# Patient Record
Sex: Male | Born: 1950 | Race: White | Hispanic: No | Marital: Married | Smoking: Never smoker
Health system: Southern US, Community
[De-identification: ages and names within clinical notes are randomized; demographics above are authoritative.]

## PROBLEM LIST (undated history)

## (undated) ENCOUNTER — Emergency Department (HOSPITAL_COMMUNITY)

## (undated) DIAGNOSIS — J189 Pneumonia, unspecified organism: Secondary | ICD-10-CM

## (undated) DIAGNOSIS — E669 Obesity, unspecified: Secondary | ICD-10-CM

## (undated) DIAGNOSIS — M545 Low back pain, unspecified: Secondary | ICD-10-CM

## (undated) DIAGNOSIS — Z860100 Personal history of colon polyps, unspecified: Secondary | ICD-10-CM

## (undated) DIAGNOSIS — J45909 Unspecified asthma, uncomplicated: Secondary | ICD-10-CM

## (undated) DIAGNOSIS — M1990 Unspecified osteoarthritis, unspecified site: Secondary | ICD-10-CM

## (undated) DIAGNOSIS — J301 Allergic rhinitis due to pollen: Secondary | ICD-10-CM

## (undated) DIAGNOSIS — J4520 Mild intermittent asthma, uncomplicated: Secondary | ICD-10-CM

## (undated) DIAGNOSIS — I10 Essential (primary) hypertension: Secondary | ICD-10-CM

## (undated) DIAGNOSIS — L301 Dyshidrosis [pompholyx]: Secondary | ICD-10-CM

## (undated) DIAGNOSIS — T8859XA Other complications of anesthesia, initial encounter: Secondary | ICD-10-CM

## (undated) DIAGNOSIS — K5792 Diverticulitis of intestine, part unspecified, without perforation or abscess without bleeding: Secondary | ICD-10-CM

## (undated) DIAGNOSIS — E66811 Obesity, class 1: Secondary | ICD-10-CM

## (undated) DIAGNOSIS — Z9289 Personal history of other medical treatment: Secondary | ICD-10-CM

## (undated) DIAGNOSIS — Z8042 Family history of malignant neoplasm of prostate: Secondary | ICD-10-CM

## (undated) DIAGNOSIS — T4145XA Adverse effect of unspecified anesthetic, initial encounter: Secondary | ICD-10-CM

## (undated) DIAGNOSIS — Z8719 Personal history of other diseases of the digestive system: Secondary | ICD-10-CM

## (undated) DIAGNOSIS — Z800 Family history of malignant neoplasm of digestive organs: Secondary | ICD-10-CM

## (undated) DIAGNOSIS — E785 Hyperlipidemia, unspecified: Secondary | ICD-10-CM

## (undated) HISTORY — DX: Essential (primary) hypertension: I10

## (undated) HISTORY — PX: APPENDECTOMY: SHX54

## (undated) HISTORY — PX: COLONOSCOPY: SHX174

## (undated) HISTORY — DX: Personal history of colon polyps, unspecified: Z86.0100

## (undated) HISTORY — DX: Low back pain, unspecified: M54.50

## (undated) HISTORY — DX: Family history of malignant neoplasm of digestive organs: Z80.0

## (undated) HISTORY — DX: Personal history of colonic polyps: Z86.010

## (undated) HISTORY — DX: Mild intermittent asthma, uncomplicated: J45.20

## (undated) HISTORY — PX: NASAL SINUS SURGERY: SHX719

## (undated) HISTORY — PX: HEMORRHOID SURGERY: SHX153

## (undated) HISTORY — PX: REVISION TOTAL HIP ARTHROPLASTY: SHX766

## (undated) HISTORY — DX: Obesity, class 1: E66.811

## (undated) HISTORY — DX: Allergic rhinitis due to pollen: J30.1

## (undated) HISTORY — PX: WISDOM TOOTH EXTRACTION: SHX21

## (undated) HISTORY — DX: Family history of malignant neoplasm of prostate: Z80.42

## (undated) HISTORY — DX: Dyshidrosis (pompholyx): L30.1

## (undated) HISTORY — DX: Diverticulitis of intestine, part unspecified, without perforation or abscess without bleeding: K57.92

## (undated) HISTORY — DX: Obesity, unspecified: E66.9

## (undated) HISTORY — DX: Personal history of other diseases of the digestive system: Z87.19

---
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
## 1991-06-13 HISTORY — PX: JOINT REPLACEMENT: SHX530

## 1999-06-02 ENCOUNTER — Encounter (INDEPENDENT_AMBULATORY_CARE_PROVIDER_SITE_OTHER): Payer: Self-pay

## 1999-06-02 ENCOUNTER — Ambulatory Visit (HOSPITAL_BASED_OUTPATIENT_CLINIC_OR_DEPARTMENT_OTHER): Admission: RE | Admit: 1999-06-02 | Discharge: 1999-06-02 | Payer: Self-pay | Admitting: *Deleted

## 2003-07-09 HISTORY — PX: NM MYOCAR PERF WALL MOTION: HXRAD629

## 2003-07-31 HISTORY — PX: CARDIAC CATHETERIZATION: SHX172

## 2005-02-24 ENCOUNTER — Encounter: Admission: RE | Admit: 2005-02-24 | Discharge: 2005-02-24 | Payer: Self-pay | Admitting: Unknown Physician Specialty

## 2006-10-22 HISTORY — PX: NM MYOCAR PERF WALL MOTION: HXRAD629

## 2006-12-10 ENCOUNTER — Ambulatory Visit (HOSPITAL_COMMUNITY): Admission: RE | Admit: 2006-12-10 | Discharge: 2006-12-10 | Payer: Self-pay | Admitting: General Surgery

## 2006-12-10 ENCOUNTER — Encounter (INDEPENDENT_AMBULATORY_CARE_PROVIDER_SITE_OTHER): Payer: Self-pay | Admitting: General Surgery

## 2007-05-28 ENCOUNTER — Ambulatory Visit: Payer: Self-pay | Admitting: Gastroenterology

## 2007-05-28 LAB — CONVERTED CEMR LAB
AST: 25 units/L (ref 0–37)
Alkaline Phosphatase: 67 units/L (ref 39–117)
BUN: 12 mg/dL (ref 6–23)
Basophils Absolute: 0 10*3/uL (ref 0.0–0.1)
Basophils Relative: 0.3 % (ref 0.0–1.0)
Chloride: 100 meq/L (ref 96–112)
Eosinophils Absolute: 0.1 10*3/uL (ref 0.0–0.6)
Eosinophils Relative: 1.2 % (ref 0.0–5.0)
Folate: 14 ng/mL
GFR calc non Af Amer: 61 mL/min
Hemoglobin: 14.5 g/dL (ref 13.0–17.0)
Lipase: 29 units/L (ref 11.0–59.0)
MCV: 88.5 fL (ref 78.0–100.0)
Monocytes Absolute: 0.6 10*3/uL (ref 0.2–0.7)
Neutrophils Relative %: 70 % (ref 43.0–77.0)
Platelets: 244 10*3/uL (ref 150–400)
RBC: 4.61 M/uL (ref 4.22–5.81)
RDW: 12.9 % (ref 11.5–14.6)
Sed Rate: 16 mm/hr (ref 0–20)
TSH: 2.39 microintl units/mL (ref 0.35–5.50)
Tissue Transglutaminase Ab, IgA: 0.2 units (ref <7)
Transferrin: 270.9 mg/dL (ref >=212.0)
WBC: 6.6 10*3/uL (ref 4.5–10.5)

## 2007-09-18 ENCOUNTER — Ambulatory Visit: Payer: Self-pay | Admitting: Gastroenterology

## 2007-10-04 ENCOUNTER — Ambulatory Visit: Payer: Self-pay | Admitting: Gastroenterology

## 2008-06-12 HISTORY — PX: OTHER SURGICAL HISTORY: SHX169

## 2009-01-21 ENCOUNTER — Ambulatory Visit (HOSPITAL_BASED_OUTPATIENT_CLINIC_OR_DEPARTMENT_OTHER): Admission: RE | Admit: 2009-01-21 | Discharge: 2009-01-21 | Payer: Self-pay | Admitting: Orthopedic Surgery

## 2010-09-17 LAB — POCT HEMOGLOBIN-HEMACUE: Hemoglobin: 15.8 g/dL (ref 13.0–17.0)

## 2010-10-25 NOTE — Op Note (Signed)
NAMENOBLE, CICALESE                ACCOUNT NO.:  [id number]   MEDICAL RECORD NO.:  [id number]          PATIENT TYPE:  AMB   LOCATION:  DSC                          FACILITY:  [facility]   PHYSICIAN:  Loreta Ave, M.D. DATE OF BIRTH:  02/14/1951   DATE OF PROCEDURE:  01/21/2009  DATE OF DISCHARGE:                               OPERATIVE REPORT   PREOPERATIVE DIAGNOSIS:  Left knee medial meniscus tear.   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   PROCEDURE:  Left knee exam under anesthesia, arthroscopy, and partial  medial meniscectomy.  Chondroplasty of medial compartment of the  patellofemoral joint.  Chondroplasty and microfracturing of lateral  femoral condyle.   SURGEON:  Loreta Ave, MD   ASSISTANT:  Genene Churn. Barry Dienes, [place]   ANESTHESIA:  General.   BLOOD LOSS:  Minimal.   TOURNIQUET:  Not employed.   SPECIMENS:  None.   CULTURES:  None.   COMPLICATIONS:  None.   DRESSING:  Soft compressive.   PROCEDURE:  The patient was brought to the operating room and placed on  the operating table in supine position.  After adequate anesthesia had  been obtained, knee examined.  Good motion, good stability.  Tourniquet  and leg holder applied.  Leg prepped and draped in usual sterile  fashion.  Three portals created, one superolateral, one each medial and  lateral parapatellar.  Inflow catheter was introduced and knee was  distended.  Arthroscope was introduced and knee inspected.  Grade 2 and  3 changes diffusely patellofemoral joint and medial compartment  debrided.  Complex tearing of posterior half of medial meniscus, taken  down to stable rim, tapered into remaining meniscus.  Cruciate ligaments  intact.  The lateral side appeared to be less than a  centimeter area of  full-thickness chondral breakdown.  When this was debrided, however,  there was obvious delamination over an area that spanned 1.5 cm diameter  with involvement of the bone below.  Debrided back to healthy tissue  with a sharp shoulder of articular cartilage.  Treated with  microfracturing in that area.  Fluid pressure reduced to confirm good  bleeding.  The  fluid pressure then increased.  The entire knee examined be sure all  loose fragments were removed.  Instruments and fluid removed.  Portals  and knee injected Marcaine.  Portals closed with 4-0 nylon.  Sterile  compressive dressing applied.  Anesthesia reversed.  Brought to the  recovery room.  Tolerated surgery well.  No complications.      Loreta Ave, M.D.  Electronically Signed     DFM/MEDQ  D:  01/21/2009  T:  01/22/2009  Job:  [id number]

## 2010-10-25 NOTE — Op Note (Signed)
NAMEDALTON, MOLESWORTH                ACCOUNT NO.:  [id number]   MEDICAL RECORD NO.:  [id number]          PATIENT TYPE:  AMB   LOCATION:  DAY                          FACILITY:  [facility]   PHYSICIAN:  Sharlet Salina T. Hoxworth, M.D.DATE OF BIRTH:  [birth year]   DATE OF PROCEDURE:  12/10/2006  DATE OF DISCHARGE:                               OPERATIVE REPORT   PREOPERATIVE DIAGNOSIS:  Internal hemorrhoids with bleeding and  prolapse.   POSTOPERATIVE DIAGNOSIS:  Internal hemorrhoids with bleeding and  prolapse.   SURGICAL PROCEDURES:  PPH.   SURGEON:  Dr. Johna Sheriff.   ANESTHESIA:  General.   BRIEF HISTORY:  Mr. Robert [name] is a 60 year old male with a long history of  painless rectal bleeding and some degree of prolapse and internal  hemorrhoids.  This has been confirmed on physical exam and anoscopy.  Colonoscopy was otherwise unremarkable.  Due to persistent symptoms, we  have recommended proceeding with PPH.  The nature of the procedure, its  indications, risks of bleeding, infection, and urologic injury were  discussed and understood.  He is now brought to the operating room for  this procedure.   DESCRIPTION OF PROCEDURE:  Following a rectal bowel prep at home, the  patient was brought to the operating room and general endotracheal  anesthesia was induced on the stretcher.  He was carefully rolled,  padded and positioned in the prone jack-knife position, the buttocks  taped apart.  The rectum and perirectal space were sterilely prepped and  draped. The anus was carefully dilated to three fingers.  There appeared  to be a small mucosal tear in the posterior midline but this did not  appear to be a chronic fissure.  There were moderate-sized combination  hemorrhoids as previously noted.  Following dilatation, a large bullet  retractor was placed.  5 cm from the dentate line was carefully measured  and at this point, a 2-0 Prolene pursestring suture was begun in the  anterior midline and then  carried around circumferentially 360 degrees  taking mucosa and submucosa.  Following this, the retractor was removed  and the PPH retractor placed.  The pursestring was palpated and was  intact and symmetrical.  The opened stapler anvil was then passed  through the staple line and the pursestring suture tied and secured.  The stapler was closed advancing it 5 cm into the rectum, closed snugly  into the green zone and left for 60 seconds for hemostasis.  The stapler  was then fired, opened and was removed easily.  The specimen was  examined and there was a nice circumferential 1-2 cm cuff of mucosa and  submucosa. The staple line was intact by palpation and inspection.  The  entire staple line was carefully inspected and a couple of minor  bleeding points were suture ligated with 3-0 Vicryl with complete  control.  A Gelfoam pack was placed.  Sponge, needle and instrument  counts were correct.  The patient was taken to recovery in good  condition.     Lorne Skeens. Hoxworth, M.D.  Electronically Signed    BTH/MEDQ  D:  12/10/2006  T:  12/10/2006  Job:  [id number]

## 2010-10-25 NOTE — Assessment & Plan Note (Signed)
[name]                         GASTROENTEROLOGY OFFICE NOTE   THEUS, ESPIN                       MRN:          [id number]  DATE:05/28/2007                            DOB:          Aug 19, 1950    HISTORY:  Mr. 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  He is referred by the corporation nurse  today because of abdominal gas, bloating, cramping and occasional  diarrhea which seems to be related to the recent onset of lactose  intolerance.   Mr. Willert had some recurrent rectal bleeding that required stapling of  his internal hemorrhoids by Dr. Johna Sheriff in the summer of 2007.  His  rectal bleeding has ceased since that time.  His last colonoscopy was in  March of 2004 by Dr. Corinda Gubler.  His mother had colon cancer in her late  81's.   The patient really denies chronic upper GI problems or any history of  hepatobiliary diseases.  His appetite is good and his weight is stable.  He does use a fair amount of sorbitol and fructose in his diet.  He has  had no true reflux symptoms, dysphagia, history of hepatitis or  pancreatitis.  [facility] and has yearly  physical examination's.   PAST MEDICAL HISTORY:  Remarkable for hypercholesterolemia.  He had an  appendectomy in the 1960's.  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  He is not on anti-inflammatories at this  time.   MEDICATIONS:  1. Lipitor 40 mg daily.  2. P.r.n. Allegra D.   ALLERGIES:  He denies drug allergies.   FAMILY HISTORY:  Remarkable for his mother with colon cancer.  Otherwise, noncontributory.  Both parents are from [place].   SOCIAL HISTORY:  He is married and lives with his wife and daughter.  He  has a Ph.D. and works as a Engineer, agricultural.  He does not smoke and  uses ethanol socially.  He gives no history of ethanol abuse or  dependency.   REVIEW OF SYSTEMS:   Noncontributory without any current CARDIOVASCULAR,  PULMONARY, GENITOURINARY, NEUROLOGICAL, or NEUROPSYCHIATRIC problems.  He has had no skin rashes, recurrent joint swelling, mouth sores, fever  or chills.  He denies Raynaud's phenomenon or any symptoms of collagen  vascular disease.   PHYSICAL EXAMINATION:  GENERAL APPEARANCE:  He is a healthy-appearing  white male appearing younger than his stated age.  He is 5 feet 11  inches and weighs 208 pounds.  VITAL SIGNS:  Blood pressure 128/78 and pulse was 82 and regular.  HEENT:  I could not appreciate stigmata of chronic liver disease.  NECK:  There is no thyromegaly.  No lymphadenopathy noted.  CHEST:  His chest is entirely clear.  HEART:  He is in a regular rhythm without murmurs, rubs or gallops.  ABDOMEN:  I could not appreciate hepatosplenomegaly, abdominal masses,  tenderness, or distention.  Bowel sounds were normal.  EXTREMITIES:  Peripheral extremities were unremarkable.  PSYCHIATRIC:  Mental status was clear.   ASSESSMENT:  1. LACTOSE INTOLERANCE  with probable non-digestion of other      nonabsorbable carbohydrates.  2. Rule out celiac disease.  3. Family history of colon carcinoma with colonoscopy due in the next      few months.  4. Status post stapling procedure for internal hemorrhoids and rectal      prolapse.  5. History of hyperlipidemia.  6. Status post appendectomy and bilateral hip replacements.  7. History of chronic low back pain from spondylosis.   RECOMMENDATIONS:  1. Lactose-free diet with use of p.r.n. Lactaid tablets.  2. Avoidance of sorbitol and fructose and other nondigestible      carbohydrates.  3. Screening laboratory parameters including celiac panel.  4. Followup colonoscopy exam at his convenience.  5. Further gastrointestinal work up depending on his laboratory data      and his clinic course.  6. Continue other medications per Dr. Artis Flock.     Vania Rea. Jarold Motto, MD, Caleen Essex, FAGA   Electronically Signed    DRP/MedQ  DD: 05/28/2007  DT: 05/29/2007  Job #: [id number]   cc:   Quita Skye. Artis Flock, M.D.

## 2011-03-29 LAB — URINALYSIS, ROUTINE W REFLEX MICROSCOPIC
Bilirubin Urine: NEGATIVE
Glucose, UA: NEGATIVE
Nitrite: NEGATIVE
Protein, ur: NEGATIVE

## 2011-03-29 LAB — DIFFERENTIAL
Basophils Absolute: 0.1
Basophils Relative: 1
Lymphs Abs: 1.3
Monocytes Absolute: 0.5
Neutrophils Relative %: 63

## 2011-03-29 LAB — CBC
Hemoglobin: 14.3
Platelets: 227
RDW: 13.9

## 2011-03-29 LAB — COMPREHENSIVE METABOLIC PANEL
AST: 21
Alkaline Phosphatase: 46
Creatinine, Ser: 0.9
GFR calc Af Amer: >60
GFR calc non Af Amer: >60
Glucose, Bld: 108 — ABNORMAL HIGH
Sodium: 139
Total Bilirubin: 1

## 2012-03-07 HISTORY — PX: NM MYOCAR PERF WALL MOTION: HXRAD629

## 2012-03-19 ENCOUNTER — Encounter (HOSPITAL_COMMUNITY): Payer: Self-pay | Admitting: Pharmacy Technician

## 2012-03-21 ENCOUNTER — Encounter (HOSPITAL_COMMUNITY)
Admission: RE | Admit: 2012-03-21 | Discharge: 2012-03-21 | Disposition: A | Discharge status: Home / Self Care | Payer: BC Managed Care – PPO | Source: Ambulatory Visit | Attending: Orthopedic Surgery | Admitting: Orthopedic Surgery

## 2012-03-21 ENCOUNTER — Encounter (HOSPITAL_COMMUNITY): Payer: Self-pay

## 2012-03-21 ENCOUNTER — Encounter (HOSPITAL_COMMUNITY)
Admission: RE | Admit: 2012-03-21 | Discharge: 2012-03-21 | Disposition: A | Discharge status: Home / Self Care | Payer: BC Managed Care – PPO | Source: Ambulatory Visit | Attending: Surgery | Admitting: Surgery

## 2012-03-21 HISTORY — DX: Unspecified asthma, uncomplicated: J45.909

## 2012-03-21 HISTORY — DX: Other complications of anesthesia, initial encounter: T88.59XA

## 2012-03-21 HISTORY — DX: Adverse effect of unspecified anesthetic, initial encounter: T41.45XA

## 2012-03-21 HISTORY — DX: Personal history of other medical treatment: Z92.89

## 2012-03-21 LAB — COMPREHENSIVE METABOLIC PANEL
BUN: 13 mg/dL (ref 6–23)
CO2: 30 mEq/L (ref 19–32)
Chloride: 100 mEq/L (ref 96–112)
Creatinine, Ser: 0.9 mg/dL (ref 0.50–1.35)
GFR calc non Af Amer: >90 mL/min (ref >90)
Glucose, Bld: 96 mg/dL (ref 70–99)
Total Bilirubin: 0.6 mg/dL (ref 0.3–1.2)

## 2012-03-21 LAB — URINALYSIS, ROUTINE W REFLEX MICROSCOPIC
Glucose, UA: NEGATIVE mg/dL
Ketones, ur: NEGATIVE mg/dL
Leukocytes, UA: NEGATIVE
pH: 6 (ref 5.0–8.0)

## 2012-03-21 LAB — PROTIME-INR
INR: 1 (ref 0.00–1.49)
Prothrombin Time: 13.1 seconds (ref 11.6–15.2)

## 2012-03-21 LAB — CBC: Hemoglobin: 14.7 g/dL (ref 13.0–17.0)

## 2012-03-21 LAB — APTT: aPTT: 27 seconds (ref 24–37)

## 2012-03-21 NOTE — Pre-Procedure Instructions (Signed)
20 [address]SHA[address] GOWDY  03/21/2012   Your procedure is scheduled on:  October 16th, Wednesday  Report to [facility] at 0630 AM.  Call this number if you have problems the morning of surgery: [phone]   Remember:   Do not eat food or drink:After Midnight.  Take these medicines the morning of surgery with A SIP OF WATER: none   Do not wear jewelry, make-up or nail polish.  Do not wear lotions, powders, or perfumes.  Do not shave 48 hours prior to surgery. Men may shave face and neck.  Do not bring valuables to the hospital.  Contacts, dentures or bridgework may not be worn into surgery.  Leave suitcase in the car. After surgery it may be brought to your room.  For patients admitted to the hospital, checkout time is 11:00 AM the day of discharge.   Patients discharged the day of surgery will not be allowed to drive home.   Special Instructions: Shower using CHG 2 nights before surgery and the night before surgery.  If you shower the day of surgery use CHG.  Use special wash - you have one bottle of CHG for all showers.  You should use approximately 1/3 of the bottle for each shower.   Please read over the following fact sheets that you were given: Pain Booklet, Coughing and Deep Breathing, Blood Transfusion Information, MRSA Information and Surgical Site Infection Prevention

## 2012-03-21 NOTE — Progress Notes (Signed)
Primary PHysician-  Dr. Cleda Clarks and [facility] Cardiologist - Dr. Tresa Endo  All cardiac records in chart; ekg, stress, cardiac mri

## 2012-03-22 ENCOUNTER — Encounter (HOSPITAL_COMMUNITY): Payer: Self-pay | Admitting: Vascular Surgery

## 2012-03-22 NOTE — H&P (Signed)
  [facility] [address]   [address] [place], [place] [address] [phone] A Division of [facility]  Loreta Ave, M.D.   Robert A. Thurston Hole, M.D.   Burnell Blanks, M.D.   Eulas Post, M.D.   Lunette Stands, M.D Buford Dresser, M.D.  Charlsie Quest, M.D.   Estell Harpin, M.D.   Melina Fiddler, M.D. Genene Churn. Barry Dienes, PA-C            Kirstin A. Shepperson, PA-C Josh Fairburn, PA-C [place], [place]   RE: Robert, [name]                                [id number]      DOB: 28-Nov-1950 PROGRESS NOTE: 03-19-12 Chief complaint: Right hip pain.  History of present illness: 33 one year-old white male with a history of right hip pain.  Returns.  States that hip symptoms unchanged from previous visit.  He is wanting to proceed with total hip revision as scheduled next week.  We received pre-op medical and cardiac clearances.   Current medications: Lipitor and Niaspan. Allergies: No known drug allergies. Past medical/surgical history: Right total hip replacement on April 07, 1991, left total hip replacement in 1992, left knee scope with debridement and microfracturing lateral femoral condyle in 2010 and hypercholesterolemia.   Review of systems: Patient denies lightheadedness, dizziness, fevers or chills, cardiac, pulmonary, GI or GU issues. Family history: Positive for cancer and stroke.  Social history: Does not smoke, admits to occasional alcohol consumption.  Patient is married.        EXAMINATION: Height: 5?10.  Weight: 200 pounds.  Blood pressure: 118/82.  Pleasant white male, alert and oriented x 3 and in no acute distress.  Gait is antalgic.  No increase in respiratory effort.  Head is normocephalic, a traumatic.  PERRLA, EOMI.  Cervical spine unremarkable.  Lungs: CTA bilaterally.  No wheezes.  Heart: RRR.  S1 and S2.  No murmurs.  Abdomen: Round and non-distended.  NBS x 4.  Soft and non-tender.  Right hip:  Positive log roll.  Decreased range of motion with discomfort.  Bilateral calves non-tender.  Neurovascularly intact.  Skin warm and dry.  Left knee good range of motion.  Joint line tender.       X-RAYS: Right hip, AP and lateral views, show poly wear with asymmetric seating of the ball.    IMPRESSION: 1. 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   2. Left knee DJD.    PLAN: We will proceed with right total hip revision as scheduled and at the time of surgery we will plan to inject the left knee with Depo-Medrol/Marcaine.  Surgical procedure, along with potential rehab/recovery time discussed.  All questions answered.  Patient states that at the time of his total hip replacement several years ago he had an epidural for anesthesia and he would like this done again.  We will schedule a pre-op anesthesia consult.    Loreta Ave, M.D.   Electronically verified by Loreta Ave, M.D. DFM(JMO):jjh D 03-19-12 T 03-20-12

## 2012-03-22 NOTE — Consult Note (Signed)
Anesthesia consult: Patient is a 61 year old male scheduled for right total hip revision and left knee intra-articular Marcaine/Depo-Medrol injection on 03/27/12 by Dr. Eulah Pont.  His anesthesia history includes shivering, hypothermia following his first THA in ''92 at [facility]  He had the second hip done a few months later with spinal anesthesia and did well.  Since then he has undergone hemorrhoid surgery ('08) and left knee arthroscopy ('10) under general anesthesia without incident.  I spoke with patient and Anesthesiologist Dr. Jacklynn Bue about patient's concerns and potential anesthesia options.  After his PAT visit he desired to undergo this procedure with GA with plans for his anesthesia staff to be proactive in warming measures as appropriate (warming blankets, bair hugger, etc.).  He will speak with his Anesthesiologist on the day of surgery and will let him/her know if he has any new concerns or wishes to pursue other anesthesia options such as spinal anesthesia.  Other history includes non-smoker, asthma, sinus surgery, history of transfusion.  PCP is Dr. Cleda [facility]) and Dr. Noreene Larsson.  He saw Dr. Nicki Guadal[place]ara [facility]) recently for a preoperative cardiology evaluation and was cleared following a normal stress test.  EKG ([facility]) on 02/27/12 showed NSR.   Nuclear stress test on 03/07/12 [facility]) showed normal myocardial perfusion, post stress EF 57%, no significant wall motion abnormalities.  CXR on 03/21/2012 showed no evidence of acute cardiopulmonary disease.  Labs noted.  Plan to proceed.  Shonna Chock, PA-C

## 2012-03-22 NOTE — Anesthesia Preprocedure Evaluation (Addendum)
Anesthesia Evaluation  Patient identified by MRN, date of birth, ID band Patient awake    Reviewed: Allergy & Precautions, H&P , NPO status , Patient's Chart, lab work & pertinent test results, reviewed documented beta blocker date and time   History of Anesthesia Complications Negative for: history of anesthetic complications  Airway Mallampati: II TM Distance: >3 FB Neck ROM: full    Dental   Pulmonary asthma ,  breath sounds clear to auscultation        Cardiovascular negative cardio ROS  Rhythm:regular     Neuro/Psych negative neurological ROS  negative psych ROS   GI/Hepatic negative GI ROS, Neg liver ROS,   Endo/Other  negative endocrine ROS  Renal/GU negative Renal ROS  negative genitourinary   Musculoskeletal   Abdominal   Peds  Hematology negative hematology ROS (+)   Anesthesia Other Findings See surgeon's H&P   Reproductive/Obstetrics negative OB ROS                           Anesthesia Physical Anesthesia Plan  ASA: II  Anesthesia Plan: General   Post-op Pain Management:    Induction: Intravenous  Airway Management Planned: Oral ETT  Additional Equipment:   Intra-op Plan:   Post-operative Plan: Extubation in OR  Informed Consent: I have reviewed the patients History and Physical, chart, labs and discussed the procedure including the risks, benefits and alternatives for the proposed anesthesia with the patient or authorized representative who has indicated his/her understanding and acceptance.   Dental Advisory Given  Plan Discussed with: CRNA and Surgeon  Anesthesia Plan Comments: (See anesthesia consult note. Shonna Chock, PA-C)        Anesthesia Quick Evaluation

## 2012-03-26 MED ORDER — CEFAZOLIN SODIUM-DEXTROSE 2-3 GM-% IV SOLR
2.0000 g | INTRAVENOUS | Status: AC
  Administered 2012-03-27: 2 g via INTRAVENOUS
  Filled 2012-03-26: qty 50

## 2012-03-27 ENCOUNTER — Encounter (HOSPITAL_COMMUNITY)
Admission: RE | Disposition: A | Discharge status: Home Health | Payer: Self-pay | Source: Ambulatory Visit | Attending: Orthopedic Surgery

## 2012-03-27 ENCOUNTER — Inpatient Hospital Stay (HOSPITAL_COMMUNITY): Payer: BC Managed Care – PPO

## 2012-03-27 ENCOUNTER — Encounter (HOSPITAL_COMMUNITY): Payer: Self-pay | Admitting: Anesthesiology

## 2012-03-27 ENCOUNTER — Encounter (HOSPITAL_COMMUNITY): Payer: Self-pay | Admitting: *Deleted

## 2012-03-27 ENCOUNTER — Encounter (HOSPITAL_COMMUNITY): Payer: Self-pay | Admitting: Vascular Surgery

## 2012-03-27 ENCOUNTER — Inpatient Hospital Stay (HOSPITAL_COMMUNITY)
Admission: RE | Admit: 2012-03-27 | Discharge: 2012-03-29 | DRG: 817 | Disposition: A | Discharge status: Home Health | Payer: BC Managed Care – PPO | Source: Ambulatory Visit | Attending: Orthopedic Surgery | Admitting: Orthopedic Surgery

## 2012-03-27 ENCOUNTER — Ambulatory Visit (HOSPITAL_COMMUNITY): Payer: BC Managed Care – PPO | Admitting: Vascular Surgery

## 2012-03-27 DIAGNOSIS — Y92009 Unspecified place in unspecified non-institutional (private) residence as the place of occurrence of the external cause: Secondary | ICD-10-CM

## 2012-03-27 DIAGNOSIS — G8928 Other chronic postprocedural pain: Secondary | ICD-10-CM | POA: Diagnosis present

## 2012-03-27 DIAGNOSIS — T84069A Wear of articular bearing surface of unspecified internal prosthetic joint, initial encounter: Secondary | ICD-10-CM | POA: Diagnosis present

## 2012-03-27 DIAGNOSIS — T84039A Mechanical loosening of unspecified internal prosthetic joint, initial encounter: Principal | ICD-10-CM | POA: Diagnosis present

## 2012-03-27 DIAGNOSIS — T8489XA Other specified complication of internal orthopedic prosthetic devices, implants and grafts, initial encounter: Secondary | ICD-10-CM | POA: Diagnosis present

## 2012-03-27 DIAGNOSIS — Z96659 Presence of unspecified artificial knee joint: Secondary | ICD-10-CM

## 2012-03-27 DIAGNOSIS — Z96649 Presence of unspecified artificial hip joint: Secondary | ICD-10-CM

## 2012-03-27 DIAGNOSIS — Y831 Surgical operation with implant of artificial internal device as the cause of abnormal reaction of the patient, or of later complication, without mention of misadventure at the time of the procedure: Secondary | ICD-10-CM | POA: Diagnosis present

## 2012-03-27 DIAGNOSIS — M1710 Unilateral primary osteoarthritis, unspecified knee: Secondary | ICD-10-CM | POA: Diagnosis present

## 2012-03-27 DIAGNOSIS — E7800 Pure hypercholesterolemia, unspecified: Secondary | ICD-10-CM | POA: Diagnosis present

## 2012-03-27 HISTORY — PX: TOTAL HIP REVISION: SHX763

## 2012-03-27 SURGERY — TOTAL HIP REVISION
Anesthesia: General | Site: Hip | Laterality: Right | Wound class: Clean

## 2012-03-27 MED ORDER — OXYCODONE-ACETAMINOPHEN 5-325 MG PO TABS
1.0000 | ORAL_TABLET | ORAL | Status: DC | PRN
  Administered 2012-03-27: 1 via ORAL
  Administered 2012-03-27 – 2012-03-28 (×4): 2 via ORAL
  Administered 2012-03-28: 1 via ORAL
  Administered 2012-03-28 – 2012-03-29 (×3): 2 via ORAL
  Filled 2012-03-27 (×3): qty 2
  Filled 2012-03-27: qty 1
  Filled 2012-03-27 (×4): qty 2
  Filled 2012-03-27 (×2): qty 1

## 2012-03-27 MED ORDER — BUPIVACAINE HCL (PF) 0.25 % IJ SOLN
INTRAMUSCULAR | Status: AC
  Filled 2012-03-27: qty 30

## 2012-03-27 MED ORDER — PATIENT'S GUIDE TO USING COUMADIN BOOK
Freq: Once | Status: AC
  Administered 2012-03-27: 14:00:00
  Filled 2012-03-27: qty 1

## 2012-03-27 MED ORDER — NEOSTIGMINE METHYLSULFATE 1 MG/ML IJ SOLN
INTRAMUSCULAR | Status: DC | PRN
  Administered 2012-03-27: 3 mg via INTRAVENOUS

## 2012-03-27 MED ORDER — HYDROMORPHONE HCL PF 1 MG/ML IJ SOLN
0.2500 mg | INTRAMUSCULAR | Status: DC | PRN
  Administered 2012-03-27 (×4): 0.5 mg via INTRAVENOUS

## 2012-03-27 MED ORDER — LIDOCAINE HCL (CARDIAC) 20 MG/ML IV SOLN
INTRAVENOUS | Status: DC | PRN
  Administered 2012-03-27: 100 mg via INTRAVENOUS

## 2012-03-27 MED ORDER — METHOCARBAMOL 100 MG/ML IJ SOLN
500.0000 mg | Freq: Four times a day (QID) | INTRAVENOUS | Status: DC | PRN
  Administered 2012-03-27: 500 mg via INTRAVENOUS
  Filled 2012-03-27: qty 5

## 2012-03-27 MED ORDER — ONDANSETRON HCL 4 MG/2ML IJ SOLN
INTRAMUSCULAR | Status: DC | PRN
  Administered 2012-03-27: 4 mg via INTRAVENOUS

## 2012-03-27 MED ORDER — DEXAMETHASONE SODIUM PHOSPHATE 4 MG/ML IJ SOLN
INTRAMUSCULAR | Status: DC | PRN
  Administered 2012-03-27: 4 mg via INTRAVENOUS

## 2012-03-27 MED ORDER — ALBUTEROL SULFATE HFA 108 (90 BASE) MCG/ACT IN AERS
1.0000 | INHALATION_SPRAY | Freq: Four times a day (QID) | RESPIRATORY_TRACT | Status: DC | PRN
  Filled 2012-03-27: qty 6.7

## 2012-03-27 MED ORDER — METOCLOPRAMIDE HCL 5 MG/ML IJ SOLN: 5.0000 mg | Freq: Three times a day (TID) | INTRAMUSCULAR | Status: DC | PRN

## 2012-03-27 MED ORDER — ALBUMIN HUMAN 5 % IV SOLN
INTRAVENOUS | Status: DC | PRN
  Administered 2012-03-27: 11:00:00 via INTRAVENOUS

## 2012-03-27 MED ORDER — BUPIVACAINE HCL (PF) 0.25 % IJ SOLN
INTRAMUSCULAR | Status: DC | PRN
  Administered 2012-03-27: 10 mL

## 2012-03-27 MED ORDER — FLEET ENEMA 7-19 GM/118ML RE ENEM: 1.0000 | ENEMA | Freq: Once | RECTAL | Status: AC | PRN

## 2012-03-27 MED ORDER — ENOXAPARIN SODIUM 40 MG/0.4ML ~~LOC~~ SOLN
40.0000 mg | SUBCUTANEOUS | Status: DC
  Administered 2012-03-28 – 2012-03-29 (×2): 40 mg via SUBCUTANEOUS
  Filled 2012-03-27 (×4): qty 0.4

## 2012-03-27 MED ORDER — PNEUMOCOCCAL VAC POLYVALENT 25 MCG/0.5ML IJ INJ
0.5000 mL | INJECTION | INTRAMUSCULAR | Status: AC
  Filled 2012-03-27: qty 0.5

## 2012-03-27 MED ORDER — ONDANSETRON HCL 4 MG PO TABS: 4.0000 mg | ORAL_TABLET | Freq: Four times a day (QID) | ORAL | Status: DC | PRN

## 2012-03-27 MED ORDER — CEFAZOLIN SODIUM 1-5 GM-% IV SOLN
1.0000 g | Freq: Three times a day (TID) | INTRAVENOUS | Status: AC
  Administered 2012-03-27 – 2012-03-28 (×2): 1 g via INTRAVENOUS
  Filled 2012-03-27 (×3): qty 50

## 2012-03-27 MED ORDER — METHYLPREDNISOLONE ACETATE 80 MG/ML IJ SUSP
INTRAMUSCULAR | Status: DC | PRN
  Administered 2012-03-27: 80 mg

## 2012-03-27 MED ORDER — ACETAMINOPHEN 325 MG PO TABS: 650.0000 mg | ORAL_TABLET | Freq: Four times a day (QID) | ORAL | Status: DC | PRN

## 2012-03-27 MED ORDER — METOCLOPRAMIDE HCL 5 MG/ML IJ SOLN: 10.0000 mg | Freq: Once | INTRAMUSCULAR | Status: DC | PRN

## 2012-03-27 MED ORDER — OXYCODONE HCL 5 MG/5ML PO SOLN: 5.0000 mg | Freq: Once | ORAL | Status: AC | PRN

## 2012-03-27 MED ORDER — ONDANSETRON HCL 4 MG/2ML IJ SOLN
4.0000 mg | Freq: Four times a day (QID) | INTRAMUSCULAR | Status: DC | PRN
  Administered 2012-03-27: 4 mg via INTRAVENOUS
  Filled 2012-03-27: qty 2

## 2012-03-27 MED ORDER — DOCUSATE SODIUM 100 MG PO CAPS
100.0000 mg | ORAL_CAPSULE | Freq: Two times a day (BID) | ORAL | Status: DC
  Administered 2012-03-27 – 2012-03-29 (×5): 100 mg via ORAL
  Filled 2012-03-27 (×6): qty 1

## 2012-03-27 MED ORDER — FENTANYL CITRATE 0.05 MG/ML IJ SOLN
INTRAMUSCULAR | Status: DC | PRN
  Administered 2012-03-27: 100 ug via INTRAVENOUS
  Administered 2012-03-27: 150 ug via INTRAVENOUS

## 2012-03-27 MED ORDER — ACETAMINOPHEN 650 MG RE SUPP: 650.0000 mg | Freq: Four times a day (QID) | RECTAL | Status: DC | PRN

## 2012-03-27 MED ORDER — METHYLPREDNISOLONE ACETATE 80 MG/ML IJ SUSP
INTRAMUSCULAR | Status: AC
  Filled 2012-03-27: qty 1

## 2012-03-27 MED ORDER — ALUM & MAG HYDROXIDE-SIMETH 200-200-20 MG/5ML PO SUSP
30.0000 mL | ORAL | Status: DC | PRN
  Administered 2012-03-27: 30 mL via ORAL
  Filled 2012-03-27: qty 30

## 2012-03-27 MED ORDER — METHOCARBAMOL 750 MG PO TABS
750.0000 mg | ORAL_TABLET | Freq: Four times a day (QID) | ORAL | Status: DC | PRN
  Administered 2012-03-28 – 2012-03-29 (×4): 750 mg via ORAL
  Filled 2012-03-27 (×5): qty 1

## 2012-03-27 MED ORDER — LACTATED RINGERS IV SOLN
INTRAVENOUS | Status: DC | PRN
  Administered 2012-03-27 (×3): via INTRAVENOUS

## 2012-03-27 MED ORDER — HYDROMORPHONE HCL PF 1 MG/ML IJ SOLN
INTRAMUSCULAR | Status: AC
  Filled 2012-03-27: qty 1

## 2012-03-27 MED ORDER — WARFARIN - PHARMACIST DOSING INPATIENT: Freq: Every day | Status: DC

## 2012-03-27 MED ORDER — PHENOL 1.4 % MT LIQD: 1.0000 | OROMUCOSAL | Status: DC | PRN

## 2012-03-27 MED ORDER — METOCLOPRAMIDE HCL 10 MG PO TABS: 5.0000 mg | ORAL_TABLET | Freq: Three times a day (TID) | ORAL | Status: DC | PRN

## 2012-03-27 MED ORDER — MENTHOL 3 MG MT LOZG: 1.0000 | LOZENGE | OROMUCOSAL | Status: DC | PRN

## 2012-03-27 MED ORDER — WARFARIN SODIUM 7.5 MG PO TABS
7.5000 mg | ORAL_TABLET | Freq: Once | ORAL | Status: AC
  Administered 2012-03-27: 7.5 mg via ORAL
  Filled 2012-03-27: qty 1

## 2012-03-27 MED ORDER — HYDROMORPHONE HCL PF 2 MG/ML IJ SOLN
2.0000 mg | INTRAMUSCULAR | Status: DC | PRN
  Filled 2012-03-27: qty 2

## 2012-03-27 MED ORDER — MIDAZOLAM HCL 5 MG/5ML IJ SOLN
INTRAMUSCULAR | Status: DC | PRN
  Administered 2012-03-27: 2 mg via INTRAVENOUS

## 2012-03-27 MED ORDER — POTASSIUM CHLORIDE IN NACL 20-0.9 MEQ/L-% IV SOLN
INTRAVENOUS | Status: DC
  Administered 2012-03-27: 16:00:00 via INTRAVENOUS
  Administered 2012-03-28: 90 mL/h via INTRAVENOUS
  Filled 2012-03-27 (×6): qty 1000

## 2012-03-27 MED ORDER — NIACIN ER (ANTIHYPERLIPIDEMIC) 500 MG PO TBCR
500.0000 mg | EXTENDED_RELEASE_TABLET | Freq: Every day | ORAL | Status: DC
  Filled 2012-03-27 (×3): qty 1

## 2012-03-27 MED ORDER — SENNOSIDES-DOCUSATE SODIUM 8.6-50 MG PO TABS
1.0000 | ORAL_TABLET | Freq: Every evening | ORAL | Status: DC | PRN
  Administered 2012-03-28: 1 via ORAL
  Filled 2012-03-27: qty 1

## 2012-03-27 MED ORDER — HYDROMORPHONE HCL PF 1 MG/ML IJ SOLN
INTRAMUSCULAR | Status: AC
  Administered 2012-03-27: 2 mg
  Filled 2012-03-27: qty 1

## 2012-03-27 MED ORDER — OXYCODONE HCL 5 MG PO TABS
ORAL_TABLET | ORAL | Status: AC
  Administered 2012-03-27: 5 mg via ORAL
  Filled 2012-03-27: qty 1

## 2012-03-27 MED ORDER — OXYCODONE HCL 5 MG PO TABS
5.0000 mg | ORAL_TABLET | Freq: Once | ORAL | Status: AC | PRN
  Administered 2012-03-27: 5 mg via ORAL

## 2012-03-27 MED ORDER — BUPIVACAINE HCL 0.25 % IJ SOLN
INTRAMUSCULAR | Status: DC | PRN
  Administered 2012-03-27: 20 mL

## 2012-03-27 MED ORDER — EPHEDRINE SULFATE 50 MG/ML IJ SOLN
INTRAMUSCULAR | Status: DC | PRN
  Administered 2012-03-27 (×2): 10 mg via INTRAVENOUS

## 2012-03-27 MED ORDER — INFLUENZA VIRUS VACC SPLIT PF IM SUSP
0.5000 mL | INTRAMUSCULAR | Status: AC
Start: 2012-03-28 — End: 2012-03-29
  Filled 2012-03-27: qty 0.5

## 2012-03-27 MED ORDER — WARFARIN VIDEO: Freq: Once | Status: DC

## 2012-03-27 MED ORDER — GLYCOPYRROLATE 0.2 MG/ML IJ SOLN
INTRAMUSCULAR | Status: DC | PRN
  Administered 2012-03-27: .4 mg via INTRAVENOUS

## 2012-03-27 MED ORDER — PROPOFOL 10 MG/ML IV BOLUS
INTRAVENOUS | Status: DC | PRN
  Administered 2012-03-27: 200 mg via INTRAVENOUS

## 2012-03-27 MED ORDER — 0.9 % SODIUM CHLORIDE (POUR BTL) OPTIME
TOPICAL | Status: DC | PRN
  Administered 2012-03-27: 1000 mL

## 2012-03-27 MED ORDER — ROCURONIUM BROMIDE 100 MG/10ML IV SOLN
INTRAVENOUS | Status: DC | PRN
  Administered 2012-03-27: 50 mg via INTRAVENOUS

## 2012-03-27 SURGICAL SUPPLY — 67 items
4.5X60MM SCREW ×2 IMPLANT
6.5X60MM FULL THREAT SCREW ×2 IMPLANT
BIT DRILL Q/COUPLING 1 (BIT) ×2 IMPLANT
BOOTCOVER CLEANROOM LRG (PROTECTIVE WEAR) ×4 IMPLANT
BRUSH FEMORAL CANAL (MISCELLANEOUS) IMPLANT
CLOTH BEACON ORANGE TIMEOUT ST (SAFETY) ×2 IMPLANT
COVER BACK TABLE 24X17X13 BIG (DRAPES) IMPLANT
COVER SURGICAL LIGHT HANDLE (MISCELLANEOUS) ×2 IMPLANT
DECANTER SPIKE VIAL GLASS SM (MISCELLANEOUS) IMPLANT
DRAPE INCISE IOBAN 66X45 STRL (DRAPES) IMPLANT
DRAPE ORTHO SPLIT 77X108 STRL (DRAPES) ×2
DRAPE SURG ORHT 6 SPLT 77X108 (DRAPES) ×2 IMPLANT
DRAPE U-SHAPE 47X51 STRL (DRAPES) ×2 IMPLANT
DRAPE U-SHAPE 76X120 STRL (DRAPES) ×4 IMPLANT
DRILL BIT 7/64X5 (BIT) ×2 IMPLANT
DRSG MEPILEX BORDER 4X12 (GAUZE/BANDAGES/DRESSINGS) ×2 IMPLANT
DURAPREP 26ML APPLICATOR (WOUND CARE) ×2 IMPLANT
ELECT BLADE 6.5 EXT (BLADE) IMPLANT
ELECT CAUTERY BLADE 6.4 (BLADE) ×2 IMPLANT
ELECT REM PT RETURN 9FT ADLT (ELECTROSURGICAL) ×2
ELECTRODE REM PT RTRN 9FT ADLT (ELECTROSURGICAL) ×1 IMPLANT
EVACUATOR 1/8 PVC DRAIN (DRAIN) IMPLANT
FACESHIELD LNG OPTICON STERILE (SAFETY) ×6 IMPLANT
GAUZE XEROFORM 5X9 LF (GAUZE/BANDAGES/DRESSINGS) ×2 IMPLANT
GLOVE BIOGEL PI IND STRL 8 (GLOVE) ×1 IMPLANT
GLOVE BIOGEL PI INDICATOR 8 (GLOVE) ×1
GLOVE ORTHO TXT STRL SZ7.5 (GLOVE) ×4 IMPLANT
GOWN PREVENTION PLUS XLARGE (GOWN DISPOSABLE) ×2 IMPLANT
GOWN STRL NON-REIN LRG LVL3 (GOWN DISPOSABLE) ×6 IMPLANT
HANDPIECE INTERPULSE COAX TIP (DISPOSABLE)
HEAD OSTEO CTAPER L FIT (Orthopedic Implant) ×1 IMPLANT
HEAD OSTEO CTAPER L FIT 28 +10 (Orthopedic Implant) ×1 IMPLANT
INSERT X3 ADM/MDM 28MM 28/48 (Orthopedic Implant) ×2 IMPLANT
KIT BASIN OR (CUSTOM PROCEDURE TRAY) ×2 IMPLANT
KIT ROOM TURNOVER OR (KITS) ×2 IMPLANT
LINER 42MM E (Orthopedic Implant) ×2 IMPLANT
MANIFOLD NEPTUNE II (INSTRUMENTS) ×2 IMPLANT
NEEDLE 22X1 1/2 (OR ONLY) (NEEDLE) ×2 IMPLANT
NS IRRIG 1000ML POUR BTL (IV SOLUTION) ×2 IMPLANT
PACK TOTAL JOINT (CUSTOM PROCEDURE TRAY) ×2 IMPLANT
PAD ARMBOARD 7.5X6 YLW CONV (MISCELLANEOUS) ×4 IMPLANT
PASSER SUT SWANSON 36MM LOOP (INSTRUMENTS) ×2 IMPLANT
PILLOW ABDUCTION HIP (SOFTGOODS) ×2 IMPLANT
SCREW CANC FT 6.5 60 (Screw) IMPLANT
SCREW CORTEX ST 4.5X60 (Screw) IMPLANT
SCREW GAP PLATE REST 6.5X20MM (Screw) ×4 IMPLANT
SET HNDPC FAN SPRY TIP SCT (DISPOSABLE) IMPLANT
STAPLER VISISTAT 35W (STAPLE) ×2 IMPLANT
SUCTION FRAZIER TIP 10 FR DISP (SUCTIONS) ×2 IMPLANT
SUT FIBERWIRE #2 38 REV NDL BL (SUTURE) ×4
SUT VIC AB 1 CTX 36 (SUTURE) ×2
SUT VIC AB 1 CTX36XBRD ANBCTR (SUTURE) ×2 IMPLANT
SUT VIC AB 2-0 FS1 27 (SUTURE) ×4 IMPLANT
SUT VIC AB 2-0 SH 27 (SUTURE)
SUT VIC AB 2-0 SH 27XBRD (SUTURE) IMPLANT
SUT VIC AB 3-0 SH 27 (SUTURE)
SUT VIC AB 3-0 SH 27X BRD (SUTURE) IMPLANT
SUTURE FIBERWR#2 38 REV NDL BL (SUTURE) ×2 IMPLANT
SYR 20CC LL (SYRINGE) ×2 IMPLANT
SYR 30ML SLIP (SYRINGE) ×2 IMPLANT
TOWEL OR 17X24 6PK STRL BLUE (TOWEL DISPOSABLE) ×2 IMPLANT
TOWEL OR 17X26 10 PK STRL BLUE (TOWEL DISPOSABLE) ×2 IMPLANT
TOWER CARTRIDGE SMART MIX (DISPOSABLE) IMPLANT
TRAY FOLEY CATH 14FR (SET/KITS/TRAYS/PACK) ×2 IMPLANT
WATER STERILE IRR 1000ML POUR (IV SOLUTION) ×8 IMPLANT
restoration gap plate screw 6.5x15mm ×2 IMPLANT
trident hemispherical shell 54mm ×2 IMPLANT

## 2012-03-27 NOTE — Plan of Care (Signed)
Problem: Consults Goal: Diagnosis- Total Joint Replacement Revision Total Hip     

## 2012-03-27 NOTE — Brief Op Note (Signed)
03/27/2012  3:08 PM  PATIENT:  Robert [name]  61 y.o. male  PRE-OPERATIVE DIAGNOSIS:  DJD RIGHT HIP  POST-OPERATIVE DIAGNOSIS:  aseptic polyethylene wear right hip  PROCEDURE:  Procedure(s) (LRB) with comments: TOTAL HIP REVISION (Right) - with left knee cortisone injection  SURGEON:  Surgeon(s) and Role:    * Loreta Ave, MD - Primary  PHYSICIAN ASSISTANT: Zonia Kief M  ANESTHESIA:   general  EBL:  Total I/O In: 3190 [P.O.:240; I.V.:2700; IV Piggyback:250] Out: 2540 [Urine:1890; Blood:650]  BLOOD ADMINISTERED:none  SPECIMEN:  Synovial fluid cultures  DISPOSITION OF SPECIMEN:  PATHOLOGY  COUNTS:  YES  TOURNIQUET:  * No tourniquets in log *   PATIENT DISPOSITION:  PACU - hemodynamically stable.

## 2012-03-27 NOTE — Evaluation (Signed)
Physical Therapy Evaluation Patient Details Name: Robert [name] MRN: [id number] DOB: 07/21/1950 Today's Date: 03/27/2012 Time: 4098-1191 PT Time Calculation (min): 42 min  PT Assessment / Plan / Recommendation Clinical Impression  Pt is a 61 y/o male s/p R THA. Pt doing well with mobility and is a good candidate to d/c home tomorrow. Acute PT will continue to follow pt and progress mobility.     PT Assessment  Patient needs continued PT services    Follow Up Recommendations  Home health PT;Supervision - Intermittent    Does the patient have the potential to tolerate intense rehabilitation      Barriers to Discharge None      Equipment Recommendations  None recommended by PT    Recommendations for Other Services     Frequency 7X/week    Precautions / Restrictions Precautions Precautions: Other (comment);Posterior Hip (None specified) Precaution Comments: Educated pt in posterior hip precautions based on incission site.  No precautions specified in chart.    Restrictions Weight Bearing Restrictions: Yes RLE Weight Bearing: Weight bearing as tolerated   Pertinent Vitals/Pain Pt reporting pain 2-3/10 in R posterior hip. Pt medicated prior to session.        Mobility  Bed Mobility Bed Mobility: Supine to Sit;Sitting - Scoot to Edge of Bed Supine to Sit: 4: Min guard;HOB elevated Sitting - Scoot to [place] of Bed: 4: Min guard Details for Bed Mobility Assistance: Cues for technique to maintain hip precautions.  Transfers Transfers: Sit to Stand;Stand to Sit Sit to Stand: 4: Min guard;With upper extremity assist;From bed Stand to Sit: 4: Min guard;To chair/3-in-1;With upper extremity assist Details for Transfer Assistance: Cues for technique to maintain hip precautions and minimize pain.  Ambulation/Gait Ambulation/Gait Assistance: 4: Min guard Ambulation Distance (Feet): 30 Feet Assistive device: Rolling walker Ambulation/Gait Assistance Details: Cues for gait  sequencing and WBAT on R LE. Cues to increase gait speed Gait Pattern: Step-to pattern;Decreased weight shift to right;Decreased stance time - right Gait velocity: Decreased. Stairs: No Wheelchair Mobility Wheelchair Mobility: No    Shoulder Instructions     Exercises Total Joint Exercises Ankle Circles/Pumps: Both;10 reps;Seated Quad Sets: Right;5 reps;Seated Heel Slides: 5 reps;Right;Supine   PT Diagnosis: Abnormality of gait;Generalized weakness;Acute pain  PT Problem List: Decreased strength;Decreased range of motion;Decreased mobility;Decreased knowledge of use of DME;Pain;Decreased knowledge of precautions PT Treatment Interventions: Gait training;DME instruction;Stair training;Functional mobility training;Therapeutic activities;Therapeutic exercise;Patient/family education   PT Goals Acute Rehab PT Goals PT Goal Formulation: With patient Time For Goal Achievement: 04/03/12 Potential to Achieve Goals: Good Pt will go Supine/Side to Sit: with modified independence PT Goal: Supine/Side to Sit - Progress: Goal set today Pt will go Sit to Supine/Side: with modified independence PT Goal: Sit to Supine/Side - Progress: Goal set today Pt will Transfer Bed to Chair/Chair to Bed: with modified independence PT Transfer Goal: Bed to Chair/Chair to Bed - Progress: Goal set today Pt will Ambulate: >150 feet;with modified independence;with rolling walker PT Goal: Ambulate - Progress: Goal set today Pt will Go Up / Down Stairs: 3-5 stairs;with supervision;with least restrictive assistive device PT Goal: Up/Down Stairs - Progress: Goal set today Pt will Perform Home Exercise Program: Independently PT Goal: Perform Home Exercise Program - Progress: Goal set today  Visit Information  Last PT Received On: 03/27/12    Subjective Data  Subjective: Agree to PT eval   Prior Functioning  Home Living Lives With: Spouse;Daughter Available Help at Discharge: Family Type of Home: House Home  Access: Stairs  to enter Entrance Stairs-Number of Steps: 2 Entrance Stairs-Rails: None Home Layout: Two level;Bed/bath upstairs Alternate Level Stairs-Number of Steps: 12 Alternate Level Stairs-Rails: Right Home Adaptive Equipment: Bedside commode/3-in-1;Walker - rolling Prior Function Level of Independence: Independent Able to Take Stairs?: Yes Driving: Yes Vocation: Full time employment Communication Communication: No difficulties    Cognition  Overall Cognitive Status: Appears within functional limits for tasks assessed/performed Arousal/Alertness: Awake/alert Orientation Level: Appears intact for tasks assessed Behavior During Session: [facility] for tasks performed    Extremity/Trunk Assessment Right Upper Extremity Assessment RUE ROM/Strength/Tone: Within functional levels Left Upper Extremity Assessment LUE ROM/Strength/Tone: Within functional levels Right Lower Extremity Assessment RLE ROM/Strength/Tone: Unable to fully assess;Due to pain;Due to precautions Left Lower Extremity Assessment LLE ROM/Strength/Tone: Within functional levels Trunk Assessment Trunk Assessment: Normal   Balance Balance Balance Assessed: Yes Static Sitting Balance Static Sitting - Balance Support: Feet supported Static Sitting - Level of Assistance: 7: Independent Static Sitting - Comment/# of Minutes: 2+ minutes sittin on EOB with no LOB.  End of Session PT - End of Session Equipment Utilized During Treatment: Gait belt Activity Tolerance: Patient tolerated treatment well Patient left: in chair;with call bell/phone within reach Nurse Communication: Mobility status;Precautions;Weight bearing status  GP     Robert [name] 03/27/2012, 6:29 PM  Robert [name] L. Eva Griffo DPT [phone]

## 2012-03-27 NOTE — Progress Notes (Signed)
ANTICOAGULATION CONSULT NOTE - Initial Consult  Pharmacy Consult for Coumadin  Indication: VTE prophylaxis  Assessment: [name] admitted for right total hip revision to start coumadin for post-op VTE prophylaxis. Pt was not on anticoagulation prior to admission. Baseline INR = 1.0, LFTs wnl. Coumadin point = 6. Patient is also on lovenox 40mg  sq Q 24hrs  Goal of Therapy:  INR 2-3 Monitor platelets by anticoagulation protocol: Yes   Plan:  - Coumadin 7.5 mg po x 1 - f/u daily PT/INR - Coumadin education book and video - D/C lovenox when INR > 2  [name], PharmD, BCPS  Clinical Pharmacist  Pager: [phone]  03/27/2012,1:42 PM   No Known Allergies  Vital Signs: Temp: 97.6 F (36.4 C) (10/16 1300) Temp src: Oral (10/16 0652) BP: 111/67 mmHg (10/16 1300) Pulse Rate: 59  (10/16 1300)  Labs: No results found for this basename: HGB:2,HCT:3,PLT:3,APTT:3,LABPROT:3,INR:3,HEPARINUNFRC:3,CREATININE:3,CKTOTAL:3,CKMB:3,TROPONINI:3 in the last 72 hours  CrCl is unknown because there is no height on file for the current visit.   Medical History: Past Medical History  Diagnosis Date  . Asthma   . History of blood transfusion   . Complication of anesthesia     shivering, hypothermia after THA '93 with GA

## 2012-03-27 NOTE — Anesthesia Postprocedure Evaluation (Signed)
  Anesthesia Post-op Note  Patient: Robert [name]  Procedure(s) Performed: Procedure(s) (LRB) with comments: TOTAL HIP REVISION (Right) - with left knee cortisone injection  Patient Location: PACU  Anesthesia Type: GA combined with regional for post-op pain  Level of Consciousness: awake  Airway and Oxygen Therapy: Patient Spontanous Breathing  Post-op Pain: mild  Post-op Assessment: Post-op Vital signs reviewed, Patient's Cardiovascular Status Stable, Respiratory Function Stable, Patent Airway, No signs of Nausea or vomiting and Pain level controlled  Post-op Vital Signs: stable  Complications: No apparent anesthesia complications

## 2012-03-27 NOTE — Progress Notes (Signed)
Utilization review completed. Charlisha Market, RN, BSN. 

## 2012-03-27 NOTE — Preoperative (Signed)
Beta Blockers   Reason not to administer Beta Blockers:Not Applicable 

## 2012-03-27 NOTE — Transfer of Care (Signed)
Immediate Anesthesia Transfer of Care Note  Patient: Robert [name]  Procedure(s) Performed: Procedure(s) (LRB) with comments: TOTAL HIP REVISION (Right) - with left knee cortisone injection  Patient Location: PACU  Anesthesia Type: General  Level of Consciousness: awake, alert  and oriented  Airway & Oxygen Therapy: Patient Spontanous Breathing and Patient connected to face mask oxygen  Post-op Assessment: Report given to PACU RN and Post -op Vital signs reviewed and stable  Post vital signs: Reviewed and stable  Complications: No apparent anesthesia complications

## 2012-03-27 NOTE — Anesthesia Procedure Notes (Signed)
Procedure Name: Intubation Date/Time: 03/27/2012 8:38 AM Performed by: Marena Chancy Pre-anesthesia Checklist: Patient identified, Timeout performed, Emergency Drugs available, Suction available and Patient being monitored Patient Re-evaluated:Patient Re-evaluated prior to inductionOxygen Delivery Method: Circle system utilized Preoxygenation: Pre-oxygenation with 100% oxygen Intubation Type: Inhalational induction with existing ETT Ventilation: Mask ventilation without difficulty Laryngoscope Size: Mac and 3 Grade View: Grade II Tube type: Oral Tube size: 7.5 mm Number of attempts: 1 Placement Confirmation: ETT inserted through vocal cords under direct vision,  breath sounds checked- equal and bilateral and positive ETCO2 Secured at: 23 cm Tube secured with: Tape Dental Injury: Teeth and Oropharynx as per pre-operative assessment

## 2012-03-27 NOTE — Interval H&P Note (Signed)
History and Physical Interval Note:  03/27/2012 8:29 AM  Robert [name]  has presented today for surgery, with the diagnosis of DJD RIGHT HIP  The various methods of treatment have been discussed with the patient and family. After consideration of risks, benefits and other options for treatment, the patient has consented to  Procedure(s) (LRB) with comments: TOTAL HIP REVISION (Right) as a surgical intervention .  The patient's history has been reviewed, patient examined, no change in status, stable for surgery.  I have reviewed the patient's chart and labs.  Questions were answered to the patient's satisfaction.     Maigan Bittinger F

## 2012-03-28 LAB — CBC
HCT: 30.6 % — ABNORMAL LOW (ref 39.0–52.0)
Hemoglobin: 10.8 g/dL — ABNORMAL LOW (ref 13.0–17.0)
MCH: 30.6 pg (ref 26.0–34.0)
MCV: 86.7 fL (ref 78.0–100.0)
RBC: 3.53 MIL/uL — ABNORMAL LOW (ref 4.22–5.81)
WBC: 8.5 10*3/uL (ref 4.0–10.5)

## 2012-03-28 LAB — BASIC METABOLIC PANEL
CO2: 28 mEq/L (ref 19–32)
Calcium: 8.5 mg/dL (ref 8.4–10.5)
Chloride: 98 mEq/L (ref 96–112)
Creatinine, Ser: 0.74 mg/dL (ref 0.50–1.35)
Glucose, Bld: 124 mg/dL — ABNORMAL HIGH (ref 70–99)

## 2012-03-28 MED ORDER — WARFARIN SODIUM 7.5 MG PO TABS
7.5000 mg | ORAL_TABLET | Freq: Once | ORAL | Status: AC
  Administered 2012-03-28: 7.5 mg via ORAL
  Filled 2012-03-28: qty 1

## 2012-03-28 NOTE — Evaluation (Signed)
Occupational Therapy Evaluation Patient Details Name: Robert [name] MRN: [id number] DOB: 1951-05-29 Today's Date: 03/28/2012 Time: 4098-1191 OT Time Calculation (min): 12 min  OT Assessment / Plan / Recommendation Clinical Impression  Pt. 61 yo male s/p right THA. Pt. is doing very well, up and ambulating in room upon arrival. Able to complete ADL's at mod independence level, educated pt on use AE for (A) with LB bathing and dressing. Pt. will have wife and daughter to (A) once home. No further acute OT needs at this time.      OT Assessment  Patient does not need any further OT services    Follow Up Recommendations  Supervision - Intermittent    Barriers to Discharge      Equipment Recommendations  None recommended by OT    Recommendations for Other Services    Frequency       Precautions / Restrictions Precautions Precautions: Posterior Hip Restrictions Weight Bearing Restrictions: Yes RLE Weight Bearing: Weight bearing as tolerated   Pertinent Vitals/Pain Pt. Reports no pain    ADL  Grooming: Performed;Wash/dry hands;Modified independent Where Assessed - Grooming: Unsupported standing Upper Body Dressing: Performed;Modified independent Where Assessed - Upper Body Dressing: Supported standing Lower Body Dressing: Performed;Modified independent Where Assessed - Lower Body Dressing: Supported standing Toilet Transfer: Buyer, retail Method: Sit to Barista: Raised toilet seat with arms (or 3-in-1 over toilet) Toileting - Clothing Manipulation and Hygiene: Performed;Modified independent Where Assessed - Toileting Clothing Manipulation and Hygiene: Sit to stand from 3-in-1 or toilet Tub/Shower Transfer: Simulated;Supervision/safety Tub/Shower Transfer Method: Science writer: Walk in shower Equipment Used: Rolling walker Transfers/Ambulation Related to ADLs: pt. is mod independent for  transfer and ambulation using RW.  ADL Comments: Pt. had already bathed upper and lower body upon arrival staing he did not require any (A). OT came in as he was getting dressed and pt. able to dress UB and LB standing at sink with mod independence. Educated pt. on use of a hip kit to (A) with LB dressing and bathing in order to maintain precautions. Simulated walk in shower and pt able to complete with supervision.     OT Diagnosis:    OT Problem List:   OT Treatment Interventions:     OT Goals    Visit Information  Last OT Received On: 03/28/12 Assistance Needed: +1    Subjective Data  Subjective: I was just tired of sitting around and needed to get up and get ready Patient Stated Goal: To return home    Prior Functioning     Home Living Lives With: Spouse;Daughter Available Help at Discharge: Family Type of Home: House Home Access: Stairs to enter Secretary/administrator of Steps: 2 Entrance Stairs-Rails: None Home Layout: Two level;Bed/bath upstairs Alternate Level Stairs-Number of Steps: 12 Alternate Level Stairs-Rails: Right Bathroom Shower/Tub: Health visitor: Standard Home Adaptive Equipment: Bedside commode/3-in-1;Walker - rolling Prior Function Level of Independence: Independent Able to Take Stairs?: Yes Driving: Yes Vocation: Full time employment 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         Vision/Perception     Cognition  Overall Cognitive Status: Appears within functional limits for tasks assessed/performed Arousal/Alertness: Awake/alert Orientation Level: Appears intact for tasks assessed Behavior During Session: [facility] for tasks performed    Extremity/Trunk Assessment Right Upper Extremity Assessment RUE ROM/Strength/Tone: Within functional levels Left Upper Extremity Assessment LUE ROM/Strength/Tone: Within functional levels Trunk Assessment Trunk Assessment: Normal     Mobility Bed Mobility Bed  Mobility: Not assessed Transfers Transfers: Sit to Stand;Stand to Sit Sit to Stand: 5: Supervision;With upper extremity assist Stand to Sit: 5: Supervision;With upper extremity assist                    End of Session OT - End of Session Activity Tolerance: Patient tolerated treatment well Patient left: in chair;with call bell/phone within reach;with family/visitor present Nurse Communication: Mobility status;Precautions  GO     Cleora Fleet 03/28/2012, 11:32 AM

## 2012-03-28 NOTE — Progress Notes (Signed)
Occupational Therapy Discharge Patient Details Name: FRIEDRICH HARRIOTT MRN: [id number] DOB: 08-Jul-1950 Today's Date: 03/28/2012 Time: 4098-1191 OT Time Calculation (min): 12 min  Patient discharged from OT services secondary to Pt is doing very well. Completes ADL's at mod independence level. Educated on hip kit for LB dressing and bathing. Will have daughter and wife at home to (A) with any needs. No further acute OT needs at this time.   Please see latest therapy progress note for current level of functioning and progress toward goals.    Progress and discharge plan discussed with patient and/or caregiver: Patient/Caregiver agrees with plan  GO     Cleora Fleet 03/28/2012, 11:33 AM

## 2012-03-28 NOTE — Op Note (Signed)
NAMEYUSIF, SWAGERTY NO.:  [id number]  MEDICAL RECORD NO.:  [id number]  LOCATION:  [id number]                        FACILITY:  [facility]  PHYSICIAN:  Loreta Ave, M.D. DATE OF BIRTH:  Apr 24, 1951  DATE OF PROCEDURE:  03/27/2012 DATE OF DISCHARGE:                              OPERATIVE REPORT   PREOPERATIVE DIAGNOSES: 1. 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  Now with     evidence of significant polyethylene wear producing pain. 2. End-stage degenerative arthritis, left knee.  POSTOPERATIVE DIAGNOSES: 1. 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  Wear on the femoral     ball component as well without loosening of the stem. 2. End-stage degenerative arthritis, left knee.  PROCEDURE: 1. Right hip revision of total hip replacement.  Complete removal of     the acetabular component as well as femoral head.  Revision of     acetabulum to a 54 mm revision metallic shell.  MDM components with     a 42 mm metallic liner in the shell.  Conversion of the femoral     component to a 28+ 10 ball and a 42 mm polyethylene interface ball. 2. Intra-articular injection, left knee with Depo-Medrol Marcaine.  SURGEON:  Loreta Ave, M.D.  ASSISTANT:  Genene Churn. Denton Meek., present throughout the entire case, necessary for timely completion of procedure.  ANESTHESIA:  General.  BLOOD LOSS:  200 mL.  BLOOD GIVEN:  None.  SPECIMENS:  None.  CULTURES:  Aerobic and anaerobic cultures were obtained of the fluid within the joint, although suspicion of infection was nonexistent.  DRESSING:  Soft compressive.  PROCEDURE:  The patient was brought to operating room, placed on the operating table in supine position.  After adequate anesthesia had been obtained, the left knee was injected intra-articularly with Depo Medrol and Marcaine under sterile technique.  The patient was then  positioned in the lateral position right side up.  Prepped and draped in usual sterile fashion.  Previous incision was opened for a posterolateral approach.  Skin and subcutaneous tissue divided.  Hemostasis with cautery.  Iliotibial band incised.  External rotator capsule was taken down off the back of the intertrochanteric groove of the femur, tagged with FiberWire.  Some clear fluid that was somewhat black tinged was sent for culture.  Did not look purulent at all.  The hip was dislocated.  The femoral head removed from that component.  The stem was well fixed.  Some wear on the head.  The acetabulum exposed.  A lot of adhesions and scar tissue removed throughout.  Extensive wear of the polyethylene component.  I attempted to try to remove the polyethylene from the metallic shell.  This had not only worn, but was completely deformed around the edge.  A great deal of time was spent to try to level the shell by drilling holes in and applying screws to pop out the liner as well as using numerous osteotomes to remove the wire and pop it out.  I could never get the polyethylene out of the metallic shell. Halfway through  the attempt when I assessed the metallic shell it was evident that there was motion behind the middle so the entire component had to be removed.  I was finally able to remove the entire component despite the 2 screws holding in the metallic shell.  One screw came out of the component, the other head stripped and was still large in the acetabulum.  That screw was able to be retrieved and back out. Fortunately not a lot of bone loss during this process.  There was fibrous tissue behind the middle cup confirming that there was evidence of loosening of that component so it did have to be removed.  I then thoroughly debrided the joint.  The acetabulum was brought up to good bleeding bone bringing it a little bit more inferior as well as medial to get a good bone.  Sized for a 54  mm component.  This was hammered in place at 45 degrees of abduction, 20 degrees of anteversion.  Good capturing and fixation, but I did augment it with 3 screws, pre drilled and passed through the cup.  I then did a series of trials.  I elected to use the MDM components.  A 42 mm internal diameter metallic shell was hammered in place in the cup.  I then attached a 28 x 10 head to the femoral component with a 42 mm polyethylene head attached to that and the hip was reduced.  Able to restore good leg length and I had great stability in flexion extension.  Wound was copiously irrigated. External rotator and capsule repaired through the back of the intertrochanteric groove through drill holes and sutures tied over bony bridge.  Iliotibial band closed with #1 Vicryl.  Skin and subcutaneous tissue with Vicryl and staples.  Margins were injected with Marcaine. Sterile compressive dressing applied.  Returned to supine position. Abduction pillow placed.  Anesthesia reversed.  Brought to the recovery room.  Tolerated surgery well.  No complications.     Loreta Ave, M.D.     DFM/MEDQ  D:  03/27/2012  T:  03/28/2012  Job:  [id number]

## 2012-03-28 NOTE — Progress Notes (Signed)
Subjective: Doing well.  Pain controlled.  No complaints.  Objective: Vital signs in last 24 hours: Temp:  [97.6 F (36.4 C)-98.8 F (37.1 C)] 98.1 F (36.7 C) (10/17 0615) Pulse Rate:  [52-96] 73  (10/17 0615) Resp:  [11-33] 16  (10/17 0615) BP: (105-131)/(60-73) 111/62 mmHg (10/17 0615) SpO2:  [97 %-100 %] 98 % (10/17 0615)  Intake/Output from previous day: 10/16 0701 - 10/17 0700 In: 4060 [P.O.:840; I.V.:2970; IV Piggyback:250] Out: 4340 [Urine:3690; Blood:650] Intake/Output this shift: Total I/O In: -  Out: 1100 [Urine:1100]   Basename 03/28/12 0530  HGB 10.8*    Basename 03/28/12 0530  WBC 8.5  RBC 3.53*  HCT 30.6*  PLT 196    Basename 03/28/12 0530  NA 135  K 3.8  CL 98  CO2 28  BUN 10  CREATININE 0.74  GLUCOSE 124*  CALCIUM 8.5    Basename 03/28/12 0530  LABPT --  INR 1.15    Exam:  Nvi.  Calf nt.    Assessment/Plan: Start PT.  Anticipate d/c home tomorrow.  D/c dilaudid, and foley.  Saline lock iv.    Braelin Brosch M 03/28/2012, 10:05 AM

## 2012-03-28 NOTE — Progress Notes (Signed)
Physical Therapy Treatment Patient Details Name: Robert [name] MRN: [id number] DOB: 11/13/1950 Today's Date: 03/28/2012 Time: 4098-1191 PT Time Calculation (min): 53 min  PT Assessment / Plan / Recommendation Comments on Treatment Session  Pt making excellent progress and has met most acute PT goals    Follow Up Recommendations  Home health PT;Supervision - Intermittent     Does the patient have the potential to tolerate intense rehabilitation     Barriers to Discharge        Equipment Recommendations  None recommended by PT    Recommendations for Other Services    Frequency 7X/week   Plan Discharge plan remains appropriate;Frequency remains appropriate    Precautions / Restrictions Precautions Precautions: Posterior Hip Precaution Comments: Pt able to recall 3/3 posteriro hip precautions.  Restrictions Weight Bearing Restrictions: Yes RLE Weight Bearing: Weight bearing as tolerated   Pertinent Vitals/Pain Pt reporting hip pain <3/10.  No intervention required.      Mobility  Bed Mobility Bed Mobility: Supine to Sit;Sit to Supine Supine to Sit: 6: Modified independent (Device/Increase time) Sitting - Scoot to Edge of Bed: 6: Modified independent (Device/Increase time) Transfers Transfers: Sit to Stand;Stand to Sit Sit to Stand: 6: Modified independent (Device/Increase time) Stand to Sit: 6: Modified independent (Device/Increase time) Ambulation/Gait Ambulation/Gait Assistance: 6: Modified independent (Device/Increase time) Assistive device: Rolling walker Ambulation/Gait Assistance Details: Instructed pt in reciprocal gait pattern.  Gait Pattern: Step-through pattern Gait velocity: WFL Stairs: Yes Stair Management Technique: One rail Right;With crutches;Forwards Number of Stairs: 12  Wheelchair Mobility Wheelchair Mobility: No    Exercises     PT Diagnosis:    PT Problem List:   PT Treatment Interventions:     PT Goals Acute Rehab PT Goals PT Goal  Formulation: With patient Time For Goal Achievement: 04/03/12 Potential to Achieve Goals: Good Pt will go Supine/Side to Sit: with modified independence PT Goal: Supine/Side to Sit - Progress: Met Pt will go Sit to Supine/Side: with modified independence PT Goal: Sit to Supine/Side - Progress: Met Pt will Transfer Bed to Chair/Chair to Bed: with modified independence PT Transfer Goal: Bed to Chair/Chair to Bed - Progress: Met Pt will Ambulate: >150 feet;with modified independence;with rolling walker PT Goal: Ambulate - Progress: Met Pt will Go Up / Down Stairs: 3-5 stairs;with supervision;with least restrictive assistive device PT Goal: Up/Down Stairs - Progress: Met Pt will Perform Home Exercise Program: Independently PT Goal: Perform Home Exercise Program - Progress: Not met Additional Goals Additional Goal #1: Pt will verbalize and demonstrate 3/3 posterior hip precautions.without prompting.   Visit Information  Last PT Received On: 03/28/12 Assistance Needed: +1    Subjective Data  Subjective: doing well Patient Stated Goal: d/c home tomorrow. Use eliptical machine at home.    Cognition  Overall Cognitive Status: Appears within functional limits for tasks assessed/performed Arousal/Alertness: Awake/alert Orientation Level: Appears intact for tasks assessed Behavior During Session: [facility] for tasks performed    Balance     End of Session PT - End of Session Equipment Utilized During Treatment: Gait belt Activity Tolerance: Patient tolerated treatment well Patient left: in chair;with call bell/phone within reach;with family/visitor present Nurse Communication: Mobility status;Precautions;Weight bearing status   GP     Robert [name] 03/28/2012, 5:38 PM [name] [name] DPT [phone]

## 2012-03-28 NOTE — Progress Notes (Signed)
I agree with the following treatment note after reviewing documentation.   Johnston, Larkin Morelos Brynn   OTR/L Pager: [phone] Office: [phone] .   

## 2012-03-28 NOTE — Progress Notes (Signed)
ANTICOAGULATION CONSULT NOTE - Follow Consult  Pharmacy Consult for Coumadin  Indication: VTE prophylaxis  No Known Allergies  Admit Complaint: 61 y.o.  male  admitted 03/27/2012 s/p right total hip revision.  Pharmacy consulted to dose warfarin for VTE prophylaxis.  Overnight Events: 03/28/2012 Post op day 1, pain=4, no bleeding noted  Assessment: Anticoagulation: VTE Prophylaxis: warfarin, enoxaparin (stop when INR > 1.8  Cardiovascular: hyperlipidemia : niaspan,   Neurology/MSK: Post op pain, tylenol  PTA Medication Issues: Home Meds Not Ordered: Lipitor, unable to obtain info on dose PTA, it is 1/2 40 mg tablet daily, meloxicam, percocet  Best Practices: DVT Prophylaxis:   Warfarin, enoxaparin  Goal of Therapy:  INR 2-3 Monitor platelets by anticoagulation protocol: Yes   Plan:  - Coumadin 7.5 mg po x 1 - f/u daily PT/INR - Coumadin education book and video   Thank you for allowing pharmacy to be a part of this patients care team.  Lovenia Kim Pharm.D., BCPS Clinical Pharmacist 03/28/2012 9:42 AM Pager: [phone] Phone: [phone]  Vital Signs: Temp: 98.1 F (36.7 C) (10/17 0615) Temp src: Oral (10/17 0615) BP: 111/62 mmHg (10/17 0615) Pulse Rate: 73  (10/17 0615)  Labs:  Basename 03/28/12 0530  HGB 10.8*  HCT 30.6*  PLT 196  APTT --  LABPROT 14.5  INR 1.15  HEPARINUNFRC --  CREATININE 0.74  CKTOTAL --  CKMB --  TROPONINI --    CrCl is unknown because there is no height on file for the current visit.   Medical History: Past Medical History  Diagnosis Date  . Asthma   . History of blood transfusion   . Complication of anesthesia     shivering, hypothermia after THA '93 with GA

## 2012-03-28 NOTE — Evaluation (Signed)
I agree with the following treatment note after reviewing documentation.   Johnston, Kevion Fatheree Brynn   OTR/L Pager: [phone] Office: [phone] .   

## 2012-03-29 LAB — BASIC METABOLIC PANEL
BUN: 11 mg/dL (ref 6–23)
CO2: 31 mEq/L (ref 19–32)
GFR calc non Af Amer: >90 mL/min (ref >90)
Glucose, Bld: 98 mg/dL (ref 70–99)
Potassium: 4.3 mEq/L (ref 3.5–5.1)
Sodium: 140 mEq/L (ref 135–145)

## 2012-03-29 LAB — CBC
HCT: 31.9 % — ABNORMAL LOW (ref 39.0–52.0)
Hemoglobin: 11 g/dL — ABNORMAL LOW (ref 13.0–17.0)
MCH: 30.4 pg (ref 26.0–34.0)
MCHC: 34.5 g/dL (ref 30.0–36.0)
RBC: 3.62 MIL/uL — ABNORMAL LOW (ref 4.22–5.81)

## 2012-03-29 LAB — PROTIME-INR: Prothrombin Time: 15.7 seconds — ABNORMAL HIGH (ref 11.6–15.2)

## 2012-03-29 MED ORDER — BISACODYL 10 MG RE SUPP
10.0000 mg | Freq: Once | RECTAL | Status: AC
  Administered 2012-03-29: 10 mg via RECTAL
  Filled 2012-03-29: qty 1

## 2012-03-29 MED ORDER — WARFARIN SODIUM 5 MG PO TABS: 5.0000 mg | ORAL_TABLET | Freq: Every day | ORAL | Status: DC

## 2012-03-29 MED ORDER — METHOCARBAMOL 750 MG PO TABS: 750.0000 mg | ORAL_TABLET | Freq: Four times a day (QID) | ORAL | Status: DC | PRN

## 2012-03-29 MED ORDER — OXYCODONE-ACETAMINOPHEN 5-325 MG PO TABS: 1.0000 | ORAL_TABLET | ORAL | Status: DC | PRN

## 2012-03-29 MED ORDER — ENOXAPARIN SODIUM 40 MG/0.4ML ~~LOC~~ SOLN: 40.0000 mg | SUBCUTANEOUS | Status: DC

## 2012-03-29 NOTE — Progress Notes (Signed)
Lovenox teaching done Pt. Pt verbalized understanding of how to admin injection. Will go over with pts wife when she arrives to pick up pt.

## 2012-03-29 NOTE — Progress Notes (Signed)
CARE MANAGEMENT NOTE 03/29/2012  Patient:  Robert [name], Robert [name]   Account Number:  [id number]  Date Initiated:  03/29/2012  Documentation initiated by:  Vance Peper  Subjective/Objective Assessment:   61 yr old male s/p right total knee revision     Action/Plan:   Patient preoperatively setup with [facility], no changes. Has DME.   Anticipated DC Date:  03/29/2012   Anticipated DC Plan:  HOME W HOME HEALTH SERVICES      DC Planning Services  CM consult      [facility] Choice  HOME HEALTH   Choice offered to / List presented to:          [facility] arranged  HH-1 RN  HH-2 PT      Status of service:  Completed, signed off Medicare Important Message given?   (If response is "NO", the following Medicare IM given date fields will be blank) Date Medicare IM given:   Date Additional Medicare IM given:    Discharge Disposition:  HOME W HOME HEALTH SERVICES  Per UR Regulation:    If discussed at Long Length of Stay Meetings, dates discussed:    Comments:

## 2012-03-29 NOTE — Progress Notes (Signed)
Physical Therapy Treatment and Discharge Patient Details Name: Robert [name] MRN: [id number] DOB: 01/14/1951 Today's Date: 03/29/2012 Time: 1212-1246 PT Time Calculation (min): 34 min  PT Assessment / Plan / Recommendation Comments on Treatment Session  Pt had question about ability to play golf once hip is healed.  Pt informed that he should confirm with MD but should be able to play golf with modified swing.  Instructed pt to follow-up with MD and OPPT.      Follow Up Recommendations  Home health PT;Supervision - Intermittent     Does the patient have the potential to tolerate intense rehabilitation     Barriers to Discharge        Equipment Recommendations  None recommended by PT    Recommendations for Other Services    Frequency 7X/week   Plan All goals met and education completed, patient dischaged from PT services    Precautions / Restrictions Precautions Precautions: Posterior Hip Precaution Comments: Pt able to recall 3/3 posteriro hip precautions.  Restrictions Weight Bearing Restrictions: Yes RLE Weight Bearing: Weight bearing as tolerated   Pertinent Vitals/Pain No c/o pain    Mobility  Bed Mobility Bed Mobility: Supine to Sit;Sit to Supine Supine to Sit: 7: Independent;HOB flat Sitting - Scoot to Edge of Bed: 7: Independent Sit to Supine: 7: Independent;HOB flat Transfers Transfers: Sit to Stand;Stand to Sit Sit to Stand: 7: Independent;From chair/3-in-1 Stand to Sit: 7: Independent;To chair/3-in-1 Ambulation/Gait Ambulation/Gait Assistance: 6: Modified independent (Device/Increase time) Ambulation Distance (Feet): 400 Feet Assistive device: Rolling walker Gait Pattern: Step-through pattern Gait velocity: WFL Stairs: No    Exercises     PT Diagnosis:    PT Problem List:   PT Treatment Interventions:     PT Goals Acute Rehab PT Goals PT Goal Formulation: With patient Time For Goal Achievement: 04/03/12 Potential to Achieve Goals: Good Pt  will go Supine/Side to Sit: with modified independence PT Goal: Supine/Side to Sit - Progress: Met Pt will go Sit to Supine/Side: with modified independence PT Goal: Sit to Supine/Side - Progress: Met Pt will Transfer Bed to Chair/Chair to Bed: with modified independence PT Transfer Goal: Bed to Chair/Chair to Bed - Progress: Met Pt will Ambulate: >150 feet;with modified independence;with rolling walker PT Goal: Ambulate - Progress: Met Pt will Go Up / Down Stairs: 3-5 stairs;with supervision;with least restrictive assistive device PT Goal: Up/Down Stairs - Progress: Met Pt will Perform Home Exercise Program: Independently PT Goal: Perform Home Exercise Program - Progress: Met Additional Goals Additional Goal #1: Pt will verbalize and demonstrate 3/3 posterior hip precautions.without prompting.  PT Goal: Additional Goal #1 - Progress: Met  Visit Information  Last PT Received On: 03/29/12    Subjective Data  Subjective: doing well Patient Stated Goal: Be able to play golf.     Cognition  Overall Cognitive Status: Appears within functional limits for tasks assessed/performed Arousal/Alertness: Awake/alert Orientation Level: Appears intact for tasks assessed Behavior During Session: [facility] for tasks performed    Balance     End of Session PT - End of Session Equipment Utilized During Treatment: Gait belt Activity Tolerance: Patient tolerated treatment well Patient left: in chair;with call bell/phone within reach Nurse Communication: Mobility status;Precautions;Weight bearing status   GP     Robert [name] 03/29/2012, 12:53 PM Robert [name] Robert [name] DPT [phone]

## 2012-03-29 NOTE — Progress Notes (Signed)
Subjective: Doing well.  No issues.  Wants to go home.   Objective: Vital signs in last 24 hours: Temp:  [97.7 F (36.5 C)-98.2 F (36.8 C)] 98.2 F (36.8 C) (10/18 0805) Pulse Rate:  [59-68] 65  (10/18 0805) Resp:  [16-18] 18  (10/18 0805) BP: (102-118)/(63-71) 118/66 mmHg (10/18 0805) SpO2:  [98 %-100 %] 99 % (10/18 0805) Weight:  [89.948 kg (198 lb 4.8 oz)] 89.948 kg (198 lb 4.8 oz) (10/18 0431)  Intake/Output from previous day: 10/17 0701 - 10/18 0700 In: 640 [P.O.:640] Out: 1100 [Urine:1100] Intake/Output this shift: Total I/O In: 240 [P.O.:240] Out: -    Basename 03/29/12 0530 03/28/12 0530  HGB 11.0* 10.8*    Basename 03/29/12 0530 03/28/12 0530  WBC 7.3 8.5  RBC 3.62* 3.53*  HCT 31.9* 30.6*  PLT 183 196    Basename 03/29/12 0530 03/28/12 0530  NA 140 135  K 4.3 3.8  CL 105 98  CO2 31 28  BUN 11 10  CREATININE 0.80 0.74  GLUCOSE 98 124*  CALCIUM 8.8 8.5    Basename 03/29/12 0530 03/28/12 0530  LABPT -- --  INR 1.28 1.15   Exam:  Wound looks good.  Staples intact.  No drainage or signs of infection.  Calf nt, nvi.  Assessment/Plan: D/c home today.  F/u in office 2 weeks postop.   OWENS,JAMES M 03/29/2012, 1:13 PM

## 2012-03-30 LAB — BODY FLUID CULTURE: Gram Stain: NONE SEEN

## 2012-04-01 ENCOUNTER — Encounter (HOSPITAL_COMMUNITY): Payer: Self-pay | Admitting: Orthopedic Surgery

## 2012-04-01 LAB — ANAEROBIC CULTURE

## 2012-04-11 NOTE — Discharge Summary (Signed)
  ABBREVIATED DISCHARGE SUMMARY      DATE OF HOSPITALIZATION:  27 Mar 2012  REASON FOR HOSPITALIZATION:  61 yo wm with hx of right total hip poly wear, pain.      SIGNIFICANT FINDINGS:  Poly wear  OPERATION:  Right total hip revision  FINAL DIAGNOSIS:  same  SECONDARY DIAGNOSIS: none  CONSULTANTS:  none  DISCHARGE CONDITION:  STABLE  DISCHARGED TO:  HOME

## 2012-05-01 ENCOUNTER — Encounter (HOSPITAL_COMMUNITY): Payer: Self-pay | Admitting: Pharmacy Technician

## 2012-05-06 ENCOUNTER — Encounter (HOSPITAL_COMMUNITY): Payer: Self-pay

## 2012-05-06 ENCOUNTER — Encounter (HOSPITAL_COMMUNITY)
Admission: RE | Admit: 2012-05-06 | Discharge: 2012-05-06 | Disposition: A | Discharge status: Home / Self Care | Payer: BC Managed Care – PPO | Source: Ambulatory Visit | Attending: Orthopedic Surgery | Admitting: Orthopedic Surgery

## 2012-05-06 HISTORY — DX: Unspecified osteoarthritis, unspecified site: M19.90

## 2012-05-06 HISTORY — DX: Pneumonia, unspecified organism: J18.9

## 2012-05-06 HISTORY — DX: Personal history of other medical treatment: Z92.89

## 2012-05-06 LAB — PROTIME-INR
INR: 1 (ref 0.00–1.49)
Prothrombin Time: 13.1 seconds (ref 11.6–15.2)

## 2012-05-06 LAB — BASIC METABOLIC PANEL
Calcium: 9.5 mg/dL (ref 8.4–10.5)
GFR calc Af Amer: >90 mL/min (ref >90)
GFR calc non Af Amer: >90 mL/min (ref >90)
Potassium: 4.1 mEq/L (ref 3.5–5.1)
Sodium: 139 mEq/L (ref 135–145)

## 2012-05-06 LAB — CBC
Hemoglobin: 12.2 g/dL — ABNORMAL LOW (ref 13.0–17.0)
Platelets: 271 10*3/uL (ref 150–400)
RBC: 4.33 MIL/uL (ref 4.22–5.81)

## 2012-05-06 LAB — HEPATIC FUNCTION PANEL
ALT: 17 U/L (ref 0–53)
AST: 20 U/L (ref 0–37)
Alkaline Phosphatase: 137 U/L — ABNORMAL HIGH (ref 39–117)
Bilirubin, Direct: <0.1 mg/dL (ref 0.0–0.3)
Total Bilirubin: 0.3 mg/dL (ref 0.3–1.2)
Total Protein: 7.3 g/dL (ref 6.0–8.3)

## 2012-05-06 LAB — TYPE AND SCREEN
ABO/RH(D): O POS
Antibody Screen: NEGATIVE

## 2012-05-06 NOTE — Pre-Procedure Instructions (Signed)
20 Robert [name]  05/06/2012   Your procedure is scheduled on:  05/15/2012  Report to [facility] at 09:30 AM.  Call this number if you have problems the morning of surgery: [phone]   Remember:   Do not eat food or drink liquid:After Midnight.-on Tuesday     Take these medicines the morning of surgery with A SIP OF WATER: Robaxin (methocarbamol) & Oxycodone if needed     Do not wear jewelry  Do not wear lotions, powders, or perfumes. You may wear deodorant.            Men may shave face and neck.  Do not bring valuables to the hospital.  Contacts, dentures or bridgework may not be worn into surgery.  Leave suitcase in the car. After surgery it may be brought to your room.  For patients admitted to the hospital, checkout time is 11:00 AM the day of discharge.   Patients discharged the day of surgery will not be allowed to drive home.  Name and phone number of your driver: /w family  Special Instructions: Shower using CHG 2 nights before surgery and the night before surgery.  If you shower the day of surgery use CHG.  Use special wash - you have one bottle of CHG for all showers.  You should use approximately 1/3 of the bottle for each shower.   Please read over the following fact sheets that you were given: Pain Booklet, Coughing and Deep Breathing, Blood Transfusion Information, Total Joint Packet, MRSA Information and Surgical Site Infection Prevention

## 2012-05-06 NOTE — Progress Notes (Signed)
Call was made to Dr. Lanell Matar office, requested records

## 2012-05-14 MED ORDER — CEFAZOLIN SODIUM-DEXTROSE 2-3 GM-% IV SOLR
2.0000 g | INTRAVENOUS | Status: AC
  Administered 2012-05-15: 2 g via INTRAVENOUS
  Filled 2012-05-14: qty 50

## 2012-05-14 NOTE — H&P (Signed)
  [facility] [address]   [address] [place], [place] [phone] A Division of [facility]  Loreta Ave, M.D.   Robert A. Thurston Hole, M.D.   Burnell Blanks, M.D.   Eulas Post, M.D.   Lunette Stands, M.D Buford Dresser, M.D.  Charlsie Quest, M.D.   Estell Harpin, M.D.   Melina Fiddler, M.D. Genene Churn. Barry Dienes, PA-C            Kirstin A. Shepperson, PA-C Josh [place], PA-C [place], [place]   RE: [place], [place]                                [phone]      DOB: February 26, 1951 PROGRESS NOTE: 04-30-12 Chief complaint: Left hip pain.  History of present illness: 63 one year-old white male with a history of left hip pain due to poly wear from previous total hip replacement.  Returns.  States that symptoms are unchanged from previous visit.  He is wanting to proceed with left total hip revision as scheduled in a couple of weeks.  He is status post right total hip revision on March 27, 2012 and this is doing well.   Current medications: Lipitor and Niaspan. Allergies: No known drug allergies. 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 Review of systems: Patient denies lightheadedness, dizziness, fevers or chills, cardiac, pulmonary, GI, GU or neuro issues. Family history: Positive for cancer and stroke. Social history: Denies smoking, admits occasional alcohol use.  Patient is married.         EXAMINATION: Height: 5?10.  Weight: 200 pounds.  Temperature: 98.5.  Respirations: 16.  Blood pressure: 125/70.  Pulse: 79.  Pleasant white male, alert and oriented x 3 and in no acute distress.  No increase in respiratory effort.  Head is normocephalic, a traumatic.  PERRLA, EOMI.  Cervical spine unremarkable.  Lungs: CTA bilaterally.  No wheezes.   Heart: RRR.  No murmurs.  Abdomen: Round and non-distended.  NBS x 4.  Soft and non-tender.  Left hip: Decreased range of motion.  Gait antalgic.  Neurovascularly intact.  Skin warm and dry.    X-RAYS: Previous x-rays of the left hip show poly wear with superior migration of the femoral head component.    IMPRESSION: Left hip poly wear.  PLAN: We will proceed with left total hip revision as scheduled in a couple of weeks.  Surgical procedure, along with potential rehab/recovery time discussed.  All questions answered.    Loreta Ave, M.D.   Electronically verified by Loreta Ave, M.D. DFM(JMO):jjh D 04-30-12 T 05-01-12

## 2012-05-15 ENCOUNTER — Inpatient Hospital Stay (HOSPITAL_COMMUNITY)
Admission: RE | Admit: 2012-05-15 | Discharge: 2012-05-17 | DRG: 817 | Disposition: A | Discharge status: Home Health | Payer: BC Managed Care – PPO | Source: Ambulatory Visit | Attending: Orthopedic Surgery | Admitting: Orthopedic Surgery

## 2012-05-15 ENCOUNTER — Encounter (HOSPITAL_COMMUNITY)
Admission: RE | Disposition: A | Discharge status: Home Health | Payer: Self-pay | Source: Ambulatory Visit | Attending: Orthopedic Surgery

## 2012-05-15 ENCOUNTER — Ambulatory Visit (HOSPITAL_COMMUNITY): Payer: BC Managed Care – PPO | Admitting: Anesthesiology

## 2012-05-15 ENCOUNTER — Encounter (HOSPITAL_COMMUNITY): Payer: Self-pay | Admitting: Anesthesiology

## 2012-05-15 ENCOUNTER — Inpatient Hospital Stay (HOSPITAL_COMMUNITY): Payer: BC Managed Care – PPO

## 2012-05-15 DIAGNOSIS — Y831 Surgical operation with implant of artificial internal device as the cause of abnormal reaction of the patient, or of later complication, without mention of misadventure at the time of the procedure: Secondary | ICD-10-CM | POA: Diagnosis present

## 2012-05-15 DIAGNOSIS — M1610 Unilateral primary osteoarthritis, unspecified hip: Secondary | ICD-10-CM | POA: Diagnosis present

## 2012-05-15 DIAGNOSIS — Y92009 Unspecified place in unspecified non-institutional (private) residence as the place of occurrence of the external cause: Secondary | ICD-10-CM

## 2012-05-15 DIAGNOSIS — M169 Osteoarthritis of hip, unspecified: Secondary | ICD-10-CM | POA: Diagnosis present

## 2012-05-15 DIAGNOSIS — E7800 Pure hypercholesterolemia, unspecified: Secondary | ICD-10-CM | POA: Diagnosis present

## 2012-05-15 DIAGNOSIS — T84069A Wear of articular bearing surface of unspecified internal prosthetic joint, initial encounter: Principal | ICD-10-CM | POA: Diagnosis present

## 2012-05-15 DIAGNOSIS — Z96649 Presence of unspecified artificial hip joint: Secondary | ICD-10-CM

## 2012-05-15 DIAGNOSIS — Z79899 Other long term (current) drug therapy: Secondary | ICD-10-CM

## 2012-05-15 DIAGNOSIS — Z8701 Personal history of pneumonia (recurrent): Secondary | ICD-10-CM

## 2012-05-15 DIAGNOSIS — G8929 Other chronic pain: Secondary | ICD-10-CM | POA: Diagnosis present

## 2012-05-15 DIAGNOSIS — J45909 Unspecified asthma, uncomplicated: Secondary | ICD-10-CM | POA: Diagnosis present

## 2012-05-15 HISTORY — DX: Hyperlipidemia, unspecified: E78.5

## 2012-05-15 HISTORY — PX: TOTAL HIP REVISION: SHX763

## 2012-05-15 LAB — URINALYSIS, ROUTINE W REFLEX MICROSCOPIC
Bilirubin Urine: NEGATIVE
Glucose, UA: NEGATIVE mg/dL
Hgb urine dipstick: NEGATIVE
Ketones, ur: NEGATIVE mg/dL
Nitrite: NEGATIVE
Specific Gravity, Urine: 1.014 (ref 1.005–1.030)
pH: 7 (ref 5.0–8.0)

## 2012-05-15 SURGERY — TOTAL HIP REVISION
Anesthesia: General | Site: Hip | Laterality: Left | Wound class: Clean

## 2012-05-15 MED ORDER — NEOSTIGMINE METHYLSULFATE 1 MG/ML IJ SOLN
INTRAMUSCULAR | Status: DC | PRN
  Administered 2012-05-15: 3 mg via INTRAVENOUS

## 2012-05-15 MED ORDER — MORPHINE SULFATE (PF) 1 MG/ML IV SOLN
INTRAVENOUS | Status: DC
  Administered 2012-05-15: 6.86 mg via INTRAVENOUS
  Administered 2012-05-15: 18:00:00 via INTRAVENOUS
  Administered 2012-05-16: 3 mg via INTRAVENOUS
  Administered 2012-05-16: 4.5 mg via INTRAVENOUS
  Administered 2012-05-16: 25 mg via INTRAVENOUS
  Filled 2012-05-15 (×2): qty 25

## 2012-05-15 MED ORDER — DIPHENHYDRAMINE HCL 50 MG/ML IJ SOLN: 12.5000 mg | Freq: Four times a day (QID) | INTRAMUSCULAR | Status: DC | PRN

## 2012-05-15 MED ORDER — LACTATED RINGERS IV SOLN
INTRAVENOUS | Status: DC | PRN
  Administered 2012-05-15 (×2): via INTRAVENOUS

## 2012-05-15 MED ORDER — BISACODYL 10 MG RE SUPP: 10.0000 mg | Freq: Every day | RECTAL | Status: DC | PRN

## 2012-05-15 MED ORDER — WARFARIN VIDEO: Freq: Once | Status: DC

## 2012-05-15 MED ORDER — MENTHOL 3 MG MT LOZG: 1.0000 | LOZENGE | OROMUCOSAL | Status: DC | PRN

## 2012-05-15 MED ORDER — CEFAZOLIN SODIUM 1-5 GM-% IV SOLN
1.0000 g | Freq: Three times a day (TID) | INTRAVENOUS | Status: AC
  Administered 2012-05-15 – 2012-05-16 (×2): 1 g via INTRAVENOUS
  Filled 2012-05-15 (×2): qty 50

## 2012-05-15 MED ORDER — GLYCOPYRROLATE 0.2 MG/ML IJ SOLN
INTRAMUSCULAR | Status: DC | PRN
  Administered 2012-05-15: 0.4 mg via INTRAVENOUS

## 2012-05-15 MED ORDER — NALOXONE HCL 0.4 MG/ML IJ SOLN: 0.4000 mg | INTRAMUSCULAR | Status: DC | PRN

## 2012-05-15 MED ORDER — WARFARIN SODIUM 7.5 MG PO TABS
7.5000 mg | ORAL_TABLET | Freq: Once | ORAL | Status: AC
  Administered 2012-05-15: 7.5 mg via ORAL
  Filled 2012-05-15: qty 1

## 2012-05-15 MED ORDER — METOCLOPRAMIDE HCL 5 MG/ML IJ SOLN: 5.0000 mg | Freq: Three times a day (TID) | INTRAMUSCULAR | Status: DC | PRN

## 2012-05-15 MED ORDER — EPHEDRINE SULFATE 50 MG/ML IJ SOLN
INTRAMUSCULAR | Status: DC | PRN
  Administered 2012-05-15 (×2): 10 mg via INTRAVENOUS

## 2012-05-15 MED ORDER — ATORVASTATIN CALCIUM 20 MG PO TABS
20.0000 mg | ORAL_TABLET | Freq: Every day | ORAL | Status: DC
Start: 2012-05-15 — End: 2012-05-17
  Administered 2012-05-15 – 2012-05-16 (×2): 20 mg via ORAL
  Filled 2012-05-15 (×3): qty 1

## 2012-05-15 MED ORDER — ONDANSETRON HCL 4 MG/2ML IJ SOLN: 4.0000 mg | Freq: Four times a day (QID) | INTRAMUSCULAR | Status: DC | PRN

## 2012-05-15 MED ORDER — ARTIFICIAL TEARS OP OINT
TOPICAL_OINTMENT | OPHTHALMIC | Status: DC | PRN
  Administered 2012-05-15: 1 via OPHTHALMIC

## 2012-05-15 MED ORDER — FENTANYL CITRATE 0.05 MG/ML IJ SOLN
INTRAMUSCULAR | Status: DC | PRN
  Administered 2012-05-15: 100 ug via INTRAVENOUS
  Administered 2012-05-15 (×4): 50 ug via INTRAVENOUS

## 2012-05-15 MED ORDER — BUPIVACAINE HCL 0.5 % IJ SOLN
INTRAMUSCULAR | Status: DC | PRN
  Administered 2012-05-15: 20 mL

## 2012-05-15 MED ORDER — HYDROMORPHONE HCL PF 1 MG/ML IJ SOLN
INTRAMUSCULAR | Status: AC
  Filled 2012-05-15: qty 1

## 2012-05-15 MED ORDER — ONDANSETRON HCL 4 MG/2ML IJ SOLN: 4.0000 mg | Freq: Once | INTRAMUSCULAR | Status: DC | PRN

## 2012-05-15 MED ORDER — DIPHENHYDRAMINE HCL 12.5 MG/5ML PO ELIX: 12.5000 mg | ORAL_SOLUTION | Freq: Four times a day (QID) | ORAL | Status: DC | PRN

## 2012-05-15 MED ORDER — METHOCARBAMOL 750 MG PO TABS
750.0000 mg | ORAL_TABLET | Freq: Four times a day (QID) | ORAL | Status: DC | PRN
  Administered 2012-05-16 – 2012-05-17 (×4): 750 mg via ORAL
  Filled 2012-05-15 (×4): qty 1

## 2012-05-15 MED ORDER — ENOXAPARIN SODIUM 40 MG/0.4ML ~~LOC~~ SOLN
40.0000 mg | SUBCUTANEOUS | Status: DC
  Administered 2012-05-16 – 2012-05-17 (×2): 40 mg via SUBCUTANEOUS
  Filled 2012-05-15 (×3): qty 0.4

## 2012-05-15 MED ORDER — POTASSIUM CHLORIDE IN NACL 20-0.9 MEQ/L-% IV SOLN
INTRAVENOUS | Status: DC
  Filled 2012-05-15 (×6): qty 1000

## 2012-05-15 MED ORDER — MIDAZOLAM HCL 5 MG/5ML IJ SOLN
INTRAMUSCULAR | Status: DC | PRN
  Administered 2012-05-15: 1 mg via INTRAVENOUS

## 2012-05-15 MED ORDER — PHENOL 1.4 % MT LIQD: 1.0000 | OROMUCOSAL | Status: DC | PRN

## 2012-05-15 MED ORDER — ALBUMIN HUMAN 5 % IV SOLN
INTRAVENOUS | Status: DC | PRN
  Administered 2012-05-15: 13:00:00 via INTRAVENOUS

## 2012-05-15 MED ORDER — NIACIN ER (ANTIHYPERLIPIDEMIC) 500 MG PO TBCR
500.0000 mg | EXTENDED_RELEASE_TABLET | Freq: Every day | ORAL | Status: DC
  Administered 2012-05-16: 500 mg via ORAL
  Filled 2012-05-15 (×4): qty 1

## 2012-05-15 MED ORDER — OXYCODONE HCL 5 MG PO TABS
5.0000 mg | ORAL_TABLET | ORAL | Status: DC | PRN
  Administered 2012-05-16 (×2): 5 mg via ORAL
  Administered 2012-05-16 – 2012-05-17 (×5): 10 mg via ORAL
  Filled 2012-05-15: qty 2
  Filled 2012-05-15 (×2): qty 1
  Filled 2012-05-15 (×4): qty 2

## 2012-05-15 MED ORDER — METOCLOPRAMIDE HCL 10 MG PO TABS: 5.0000 mg | ORAL_TABLET | Freq: Three times a day (TID) | ORAL | Status: DC | PRN

## 2012-05-15 MED ORDER — ALBUTEROL SULFATE HFA 108 (90 BASE) MCG/ACT IN AERS
1.0000 | INHALATION_SPRAY | Freq: Every day | RESPIRATORY_TRACT | Status: DC | PRN
  Filled 2012-05-15: qty 6.7

## 2012-05-15 MED ORDER — LIDOCAINE HCL (CARDIAC) 20 MG/ML IV SOLN
INTRAVENOUS | Status: DC | PRN
  Administered 2012-05-15: 100 mg via INTRAVENOUS

## 2012-05-15 MED ORDER — ACETAMINOPHEN 325 MG PO TABS: 650.0000 mg | ORAL_TABLET | Freq: Four times a day (QID) | ORAL | Status: DC | PRN

## 2012-05-15 MED ORDER — HYDROMORPHONE HCL PF 1 MG/ML IJ SOLN
INTRAMUSCULAR | Status: DC | PRN
  Administered 2012-05-15 (×2): 0.5 mg via INTRAVENOUS

## 2012-05-15 MED ORDER — ACETAMINOPHEN 650 MG RE SUPP: 650.0000 mg | Freq: Four times a day (QID) | RECTAL | Status: DC | PRN

## 2012-05-15 MED ORDER — BUPIVACAINE HCL (PF) 0.5 % IJ SOLN
INTRAMUSCULAR | Status: AC
  Filled 2012-05-15: qty 30

## 2012-05-15 MED ORDER — HYDROMORPHONE HCL PF 1 MG/ML IJ SOLN
0.2500 mg | INTRAMUSCULAR | Status: DC | PRN
  Administered 2012-05-15 (×4): 0.5 mg via INTRAVENOUS

## 2012-05-15 MED ORDER — SODIUM CHLORIDE 0.9 % IR SOLN
Status: DC | PRN
  Administered 2012-05-15: 1000 mL

## 2012-05-15 MED ORDER — DEXTROSE 5 % IV SOLN
INTRAVENOUS | Status: DC | PRN
  Administered 2012-05-15: 11:00:00 via INTRAVENOUS

## 2012-05-15 MED ORDER — ONDANSETRON HCL 4 MG/2ML IJ SOLN
INTRAMUSCULAR | Status: DC | PRN
  Administered 2012-05-15: 4 mg via INTRAVENOUS

## 2012-05-15 MED ORDER — PROPOFOL 10 MG/ML IV BOLUS
INTRAVENOUS | Status: DC | PRN
  Administered 2012-05-15: 200 mg via INTRAVENOUS

## 2012-05-15 MED ORDER — DOCUSATE SODIUM 100 MG PO CAPS
100.0000 mg | ORAL_CAPSULE | Freq: Two times a day (BID) | ORAL | Status: DC
  Administered 2012-05-15 – 2012-05-17 (×4): 100 mg via ORAL
  Filled 2012-05-15 (×5): qty 1

## 2012-05-15 MED ORDER — LACTATED RINGERS IV SOLN
INTRAVENOUS | Status: DC
  Administered 2012-05-15: 11:00:00 via INTRAVENOUS

## 2012-05-15 MED ORDER — ROCURONIUM BROMIDE 100 MG/10ML IV SOLN
INTRAVENOUS | Status: DC | PRN
  Administered 2012-05-15: 50 mg via INTRAVENOUS

## 2012-05-15 MED ORDER — WARFARIN - PHARMACIST DOSING INPATIENT: Freq: Every day | Status: DC

## 2012-05-15 MED ORDER — COUMADIN BOOK
Freq: Once | Status: DC
  Filled 2012-05-15: qty 1

## 2012-05-15 MED ORDER — SODIUM CHLORIDE 0.9 % IJ SOLN: 9.0000 mL | INTRAMUSCULAR | Status: DC | PRN

## 2012-05-15 MED ORDER — ONDANSETRON HCL 4 MG PO TABS: 4.0000 mg | ORAL_TABLET | Freq: Four times a day (QID) | ORAL | Status: DC | PRN

## 2012-05-15 SURGICAL SUPPLY — 66 items
4.5X40MM SCREW ×2 IMPLANT
56mm cup ×2 IMPLANT
BIT DRILL Q/COUPLING 1 (BIT) ×2 IMPLANT
BONE CANC CHIPS 40CC CAN1/2 (Bone Implant) ×4 IMPLANT
BOOTCOVER CLEANROOM LRG (PROTECTIVE WEAR) ×4 IMPLANT
BRUSH FEMORAL CANAL (MISCELLANEOUS) IMPLANT
CHIPS CANC BONE 40CC CAN1/2 (Bone Implant) ×2 IMPLANT
CLOTH BEACON ORANGE TIMEOUT ST (SAFETY) ×2 IMPLANT
COVER BACK TABLE 24X17X13 BIG (DRAPES) IMPLANT
COVER SURGICAL LIGHT HANDLE (MISCELLANEOUS) ×2 IMPLANT
DECANTER SPIKE VIAL GLASS SM (MISCELLANEOUS) ×2 IMPLANT
DRAPE INCISE IOBAN 66X45 STRL (DRAPES) IMPLANT
DRAPE ORTHO SPLIT 77X108 STRL (DRAPES) ×2
DRAPE SURG ORHT 6 SPLT 77X108 (DRAPES) ×2 IMPLANT
DRAPE U-SHAPE 47X51 STRL (DRAPES) ×2 IMPLANT
DRAPE U-SHAPE 76X120 STRL (DRAPES) ×4 IMPLANT
DRILL BIT 7/64X5 (BIT) ×2 IMPLANT
DRSG MEPILEX BORDER 4X12 (GAUZE/BANDAGES/DRESSINGS) ×2 IMPLANT
DURAPREP 26ML APPLICATOR (WOUND CARE) ×2 IMPLANT
ELECT BLADE 6.5 EXT (BLADE) IMPLANT
ELECT CAUTERY BLADE 6.4 (BLADE) ×2 IMPLANT
ELECT REM PT RETURN 9FT ADLT (ELECTROSURGICAL) ×2
ELECTRODE REM PT RTRN 9FT ADLT (ELECTROSURGICAL) ×1 IMPLANT
EVACUATOR 1/8 PVC DRAIN (DRAIN) IMPLANT
FACESHIELD LNG OPTICON STERILE (SAFETY) ×4 IMPLANT
GAUZE XEROFORM 5X9 LF (GAUZE/BANDAGES/DRESSINGS) ×2 IMPLANT
GLOVE BIOGEL PI IND STRL 8 (GLOVE) ×1 IMPLANT
GLOVE BIOGEL PI INDICATOR 8 (GLOVE) ×1
GLOVE ORTHO TXT STRL SZ7.5 (GLOVE) ×4 IMPLANT
GOWN PREVENTION PLUS XLARGE (GOWN DISPOSABLE) ×2 IMPLANT
GOWN STRL NON-REIN LRG LVL3 (GOWN DISPOSABLE) ×6 IMPLANT
HANDPIECE INTERPULSE COAX TIP (DISPOSABLE)
HEAD OSTEO CTAPER L FIT (Orthopedic Implant) ×1 IMPLANT
HEAD OSTEO CTAPER L FIT 28 +10 (Orthopedic Implant) ×1 IMPLANT
INSERT X3 ADM/MDM 28MM 28/48 (Orthopedic Implant) ×2 IMPLANT
KIT BASIN OR (CUSTOM PROCEDURE TRAY) ×2 IMPLANT
KIT ROOM TURNOVER OR (KITS) ×2 IMPLANT
LINER 42MM E (Orthopedic Implant) ×2 IMPLANT
MANIFOLD NEPTUNE II (INSTRUMENTS) ×2 IMPLANT
NEEDLE 22X1 1/2 (OR ONLY) (NEEDLE) ×2 IMPLANT
NS IRRIG 1000ML POUR BTL (IV SOLUTION) ×2 IMPLANT
PACK TOTAL JOINT (CUSTOM PROCEDURE TRAY) ×2 IMPLANT
PAD ARMBOARD 7.5X6 YLW CONV (MISCELLANEOUS) ×4 IMPLANT
PASSER SUT SWANSON 36MM LOOP (INSTRUMENTS) ×2 IMPLANT
PILLOW ABDUCTION HIP (SOFTGOODS) ×2 IMPLANT
SCREW CORTEX ST 4.5X40 (Screw) ×2 IMPLANT
SCREW GAP PLATE REST 6.5X20MM (Screw) ×6 IMPLANT
SET HNDPC FAN SPRY TIP SCT (DISPOSABLE) IMPLANT
STAPLER VISISTAT 35W (STAPLE) ×2 IMPLANT
SUCTION FRAZIER TIP 10 FR DISP (SUCTIONS) ×2 IMPLANT
SUT FIBERWIRE #2 38 REV NDL BL (SUTURE) ×6
SUT VIC AB 1 CTX 36 (SUTURE) ×4
SUT VIC AB 1 CTX36XBRD ANBCTR (SUTURE) ×4 IMPLANT
SUT VIC AB 2-0 FS1 27 (SUTURE) ×4 IMPLANT
SUT VIC AB 2-0 SH 27 (SUTURE)
SUT VIC AB 2-0 SH 27XBRD (SUTURE) IMPLANT
SUT VIC AB 3-0 SH 27 (SUTURE)
SUT VIC AB 3-0 SH 27X BRD (SUTURE) IMPLANT
SUTURE FIBERWR#2 38 REV NDL BL (SUTURE) ×3 IMPLANT
SYR 20CC LL (SYRINGE) ×2 IMPLANT
SYR 30ML SLIP (SYRINGE) ×2 IMPLANT
TOWEL OR 17X24 6PK STRL BLUE (TOWEL DISPOSABLE) ×2 IMPLANT
TOWEL OR 17X26 10 PK STRL BLUE (TOWEL DISPOSABLE) ×2 IMPLANT
TOWER CARTRIDGE SMART MIX (DISPOSABLE) IMPLANT
TRAY FOLEY CATH 14FR (SET/KITS/TRAYS/PACK) ×2 IMPLANT
WATER STERILE IRR 1000ML POUR (IV SOLUTION) ×8 IMPLANT

## 2012-05-15 NOTE — Anesthesia Preprocedure Evaluation (Addendum)
Anesthesia Evaluation  Patient identified by MRN, date of birth, ID band Patient awake    Reviewed: Allergy & Precautions, H&P , NPO status , Patient's Chart, lab work & pertinent test results  Airway Mallampati: I TM Distance: >3 FB Neck ROM: full    Dental   Pulmonary asthma , pneumonia -, resolved,          Cardiovascular Rhythm:regular Rate:Normal     Neuro/Psych negative neurological ROS     GI/Hepatic negative GI ROS, Neg liver ROS,   Endo/Other  negative endocrine ROS  Renal/GU negative Renal ROS     Musculoskeletal  (+) Arthritis -, Osteoarthritis,    Abdominal   Peds  Hematology negative hematology ROS (+)   Anesthesia Other Findings   Reproductive/Obstetrics                          Anesthesia Physical Anesthesia Plan  ASA: II  Anesthesia Plan: General   Post-op Pain Management:    Induction: Intravenous  Airway Management Planned: Oral ETT  Additional Equipment:   Intra-op Plan:   Post-operative Plan: Extubation in OR  Informed Consent: I have reviewed the patients History and Physical, chart, labs and discussed the procedure including the risks, benefits and alternatives for the proposed anesthesia with the patient or authorized representative who has indicated his/her understanding and acceptance.   Dental advisory given  Plan Discussed with: Anesthesiologist and Surgeon  Anesthesia Plan Comments:         Anesthesia Quick Evaluation

## 2012-05-15 NOTE — Anesthesia Procedure Notes (Signed)
Procedure Name: Intubation Date/Time: 05/15/2012 11:28 AM Performed by: Julianne Rice K Pre-anesthesia Checklist: Emergency Drugs available, Suction available, Patient being monitored, Patient identified and Timeout performed Patient Re-evaluated:Patient Re-evaluated prior to inductionOxygen Delivery Method: Circle system utilized Preoxygenation: Pre-oxygenation with 100% oxygen Intubation Type: IV induction Ventilation: Mask ventilation without difficulty Laryngoscope Size: Miller and 2 Grade View: Grade II Tube type: Oral Number of attempts: 1 Airway Equipment and Method: Stylet and LTA kit utilized Placement Confirmation: ETT inserted through vocal cords under direct vision,  breath sounds checked- equal and bilateral and positive ETCO2 Secured at: 24 cm Tube secured with: Tape Dental Injury: Teeth and Oropharynx as per pre-operative assessment

## 2012-05-15 NOTE — Progress Notes (Signed)
Orthopedic Tech Progress Note Patient Details:  Robert [name] 06-Oct-1950 [id number]  Patient ID: Robert [name], male   DOB: 07/18/1950, 62 y.o.   MRN: [id number] Trapeze bar patient helper  Nikki Dom 05/15/2012, 7:08 PM

## 2012-05-15 NOTE — Progress Notes (Signed)
Their are no foot pumps at present

## 2012-05-15 NOTE — Progress Notes (Signed)
ANTICOAGULATION CONSULT NOTE - Initial Consult  Pharmacy Consult for Coumadin Indication: VTE prophylaxis s/p total hip revision  No Known Allergies Patient Measurements: Weight 90.4 kg on 05/06/12 Height 178 cm on 05/06/12 Vital Signs: Temp: 98.1 F (36.7 C) (12/04 1530) Temp src: Oral (12/04 0804) BP: 116/54 mmHg (12/04 1545) Pulse Rate: 71  (12/04 1545) Labs: Baseline labs from 05/06/12: INR 1, H/H 12.2/36.9, Plts 271, AST/ALT 20/12, Alk Phos 137, SCr 0.84, Albumin 3.6  Medical History: Past Medical History  Diagnosis Date  . History of blood transfusion     autologus  . Complication of anesthesia     shivering, hypothermia after THA '93 with GA  . H/O cardiovascular stress test     results good, [facility], done as a baseline  Sept. 2013  . Asthma     rare use of ventolin inhaler, last time a month  or more ago   . Pneumonia     hosp. as a child   . Arthritis     heredity - hip dysplasia, OA   Medications:  Scheduled:    .  ceFAZolin (ANCEF) IV  1 g Intravenous Q8H  . [COMPLETED]  ceFAZolin (ANCEF) IV  2 g Intravenous 60 min Pre-Op  . docusate sodium  100 mg Oral BID  . enoxaparin (LOVENOX) injection  40 mg Subcutaneous Q24H  . HYDROmorphone      . HYDROmorphone      . morphine   Intravenous Q4H    Assessment: 61 YOM s/p left total hip revision today to start Coumadin for VTE prophylaxis. Baseline INR as well as CBC and LFTS were wnl on 05/06/12. No overt bleeding reported. Warfarin score is 7. Patient is also on Lovenox 40 SQ q24h. Patient is not on any significant potentiators of warfarin.   Goal of Therapy:  INR 2-3   Plan:  1. Coumadin 7.5mg  po x1 tonight. 2. Follow-up daily PT/INR.  3. Coumadin book and video to initiate education.   Link Snuffer, PharmD, BCPS Clinical Pharmacist [phone] 05/15/2012,4:29 PM

## 2012-05-15 NOTE — Progress Notes (Signed)
Taking fluids and ice without difficulty

## 2012-05-15 NOTE — Brief Op Note (Signed)
05/15/2012  2:16 PM  PATIENT:  Robert [name]  61 y.o. male  PRE-OPERATIVE DIAGNOSIS:  DJD LEFT hip  POST-OPERATIVE DIAGNOSIS:  Degenerative Joint Disease Left Hip  PROCEDURE:  Procedure(s) (LRB) with comments: TOTAL HIP REVISION (Left)  SURGEON:  Surgeon(s) and Role:    * Loreta Ave, MD - Primary  PHYSICIAN ASSISTANT: Shota Kohrs M     ANESTHESIA:   general  EBL:  Total I/O In: 1300 [I.V.:1050; IV Piggyback:250] Out: 1025 [Urine:700; Blood:325]  SPECIMEN:  Synovial fluid DISPOSITION OF SPECIMEN:  PATHOLOGY  COUNTS:  YES  TOURNIQUET:  * No tourniquets in log *    PATIENT DISPOSITION:  PACU - hemodynamically stable.

## 2012-05-15 NOTE — Preoperative (Signed)
Beta Blockers   Reason not to administer Beta Blockers:Not Applicable 

## 2012-05-15 NOTE — Progress Notes (Signed)
Hip xray done.

## 2012-05-15 NOTE — Interval H&P Note (Signed)
History and Physical Interval Note:  05/15/2012 8:15 AM  Robert [name]  has presented today for surgery, with the diagnosis of DJD LEFT hip  The various methods of treatment have been discussed with the patient and family. After consideration of risks, benefits and other options for treatment, the patient has consented to  Procedure(s) (LRB) with comments: TOTAL HIP REVISION (Left) as a surgical intervention .  The patient's history has been reviewed, patient examined, no change in status, stable for surgery.  I have reviewed the patient's chart and labs.  Questions were answered to the patient's satisfaction.     Jerre Vandrunen F

## 2012-05-15 NOTE — Plan of Care (Signed)
Problem: Consults Goal: Diagnosis- Total Joint Replacement Revision Total Hip     

## 2012-05-15 NOTE — Transfer of Care (Signed)
Immediate Anesthesia Transfer of Care Note  Patient: Robert [name]  Procedure(s) Performed: Procedure(s) (LRB) with comments: TOTAL HIP REVISION (Left)  Patient Location: PACU  Anesthesia Type:General  Level of Consciousness: awake, alert  and oriented  Airway & Oxygen Therapy: Patient Spontanous Breathing and Patient connected to nasal cannula oxygen  Post-op Assessment: Report given to PACU RN, Post -op Vital signs reviewed and stable and Patient moving all extremities  Post vital signs: Reviewed and stable  Complications: No apparent anesthesia complications

## 2012-05-15 NOTE — Progress Notes (Signed)
9604.Marland KitchenMarland KitchenMarland KitchenUrine sample not obtained during PAT admission.   Have instructed pt to, If able, urinate in cup.  DA

## 2012-05-15 NOTE — Progress Notes (Signed)
Orthopedic Tech Progress Note Patient Details:  Robert [name] 08-11-1950 [id number]  Patient ID: Robert [name], male   DOB: [birth year], 61 y.o.   MRN: [id number] Viewed order from doctor's order list  Nikki Dom 05/15/2012, 7:08 PM

## 2012-05-16 ENCOUNTER — Encounter (HOSPITAL_COMMUNITY): Payer: Self-pay | Admitting: General Practice

## 2012-05-16 LAB — BASIC METABOLIC PANEL
BUN: 8 mg/dL (ref 6–23)
CO2: 29 mEq/L (ref 19–32)
Calcium: 8.3 mg/dL — ABNORMAL LOW (ref 8.4–10.5)
Creatinine, Ser: 0.82 mg/dL (ref 0.50–1.35)
GFR calc Af Amer: >90 mL/min (ref >90)

## 2012-05-16 LAB — CBC
HCT: 27.4 % — ABNORMAL LOW (ref 39.0–52.0)
MCH: 28.8 pg (ref 26.0–34.0)
MCV: 85.6 fL (ref 78.0–100.0)
Platelets: 174 10*3/uL (ref 150–400)
RDW: 13.9 % (ref 11.5–15.5)

## 2012-05-16 LAB — URINE CULTURE: Colony Count: NO GROWTH

## 2012-05-16 MED ORDER — WARFARIN SODIUM 7.5 MG PO TABS
7.5000 mg | ORAL_TABLET | Freq: Once | ORAL | Status: AC
  Administered 2012-05-16: 7.5 mg via ORAL
  Filled 2012-05-16: qty 1

## 2012-05-16 NOTE — Progress Notes (Signed)
UR COMPLETED  

## 2012-05-16 NOTE — Evaluation (Signed)
Physical Therapy Evaluation Patient Details Name: Robert [name] MRN: [id number] DOB: 11/02/1950 Today's Date: 05/16/2012 Time: 4098-1191 PT Time Calculation (min): 40 min  PT Assessment / Plan / Recommendation Clinical Impression  Pt is a 61 y/o male admitted s/p left THA revision along with the below PT problem list. Pt would benefit from acute PT to maximize independence and facilitate d/c home with HHPT.    PT Assessment  Patient needs continued PT services    Follow Up Recommendations  Home health PT    Does the patient have the potential to tolerate intense rehabilitation      Barriers to Discharge None      Equipment Recommendations  None recommended by PT    Recommendations for Other Services     Frequency 7X/week    Precautions / Restrictions Precautions Precautions: Posterior Hip Precaution Booklet Issued: Yes (comment) Precaution Comments: Educated pt on 3/3 posterior hip precautions. Restrictions Weight Bearing Restrictions: Yes LLE Weight Bearing: Weight bearing as tolerated   Pertinent Vitals/Pain 3/10 in left hip. Pt repositioned.      Mobility  Bed Mobility Bed Mobility: Supine to Sit Supine to Sit: 4: Min assist;HOB flat;With rails Details for Bed Mobility Assistance: Assist for left LE due to pain as well as to trunk. Cues for sequence to protect left hip. Transfers Transfers: Sit to Stand;Stand to Sit Sit to Stand: 4: Min assist;With upper extremity assist;From bed Stand to Sit: 4: Min guard;With upper extremity assist;To chair/3-in-1 Details for Transfer Assistance: Assist for balance with cues for safest hand/left LE placement. Ambulation/Gait Ambulation/Gait Assistance: 4: Min guard Ambulation Distance (Feet): 200 Feet Assistive device: Rolling walker Ambulation/Gait Assistance Details: Guarding for balance with cues for tall posture and safe sequence. Gait Pattern: Step-to pattern;Decreased step length - left;Decreased stance time -  left;Trunk flexed Stairs: No Wheelchair Mobility Wheelchair Mobility: No    Shoulder Instructions     Exercises Total Joint Exercises Ankle Circles/Pumps: AROM;Left;10 reps;Supine Quad Sets: AROM;Left;10 reps;Supine Short Arc Quad: AROM;Left;10 reps;Supine Heel Slides: AROM;Left;10 reps;Supine Hip ABduction/ADduction: AROM;Left;10 reps;Supine   PT Diagnosis: Difficulty walking;Acute pain  PT Problem List: Decreased strength;Decreased activity tolerance;Decreased balance;Decreased mobility;Decreased knowledge of use of DME;Decreased knowledge of precautions;Pain PT Treatment Interventions: DME instruction;Gait training;Stair training;Functional mobility training;Therapeutic activities;Balance training;Therapeutic exercise;Patient/family education   PT Goals Acute Rehab PT Goals PT Goal Formulation: With patient Time For Goal Achievement: 05/23/12 Potential to Achieve Goals: Good Pt will go Supine/Side to Sit: with modified independence PT Goal: Supine/Side to Sit - Progress: Goal set today Pt will go Sit to Supine/Side: with modified independence PT Goal: Sit to Supine/Side - Progress: Goal set today Pt will go Sit to Stand: with modified independence PT Goal: Sit to Stand - Progress: Goal set today Pt will go Stand to Sit: with modified independence PT Goal: Stand to Sit - Progress: Goal set today Pt will Ambulate: >150 feet;with modified independence;with least restrictive assistive device PT Goal: Ambulate - Progress: Goal set today Pt will Go Up / Down Stairs: 1-2 stairs;with modified independence;with least restrictive assistive device PT Goal: Up/Down Stairs - Progress: Goal set today Pt will Perform Home Exercise Program: Independently PT Goal: Perform Home Exercise Program - Progress: Goal set today  Visit Information  Last PT Received On: 05/16/12 Assistance Needed: +1    Subjective Data  Subjective: "I'm doing pretty good." Patient Stated Goal: Go home.   Prior  Functioning  Home Living Lives With: Spouse;Daughter Available Help at Discharge: Family;Available 24 hours/day Type of Home: House  Home Access: Stairs to enter Entrance Stairs-Number of Steps: 2 Entrance Stairs-Rails: Left Home Layout: Two level;Able to live on main level with bedroom/bathroom Bathroom Shower/Tub: Walk-in shower;Door Bathroom Toilet: Handicapped height Home Adaptive Equipment: Walker - rolling Prior Function Level of Independence: Independent Able to Take Stairs?: Yes Driving: Yes Vocation: Full time employment Communication Communication: No difficulties Dominant Hand: Right    Cognition  Overall Cognitive Status: Appears within functional limits for tasks assessed/performed Arousal/Alertness: Awake/alert Orientation Level: Appears intact for tasks assessed Behavior During Session: [facility] for tasks performed    Extremity/Trunk Assessment Right Upper Extremity Assessment RUE ROM/Strength/Tone: Within functional levels RUE Sensation: WFL - Light Touch RUE Coordination: WFL - gross motor Left Upper Extremity Assessment LUE ROM/Strength/Tone: Within functional levels LUE Sensation: WFL - Light Touch LUE Coordination: WFL - gross motor Right Lower Extremity Assessment RLE ROM/Strength/Tone: Within functional levels RLE Sensation: WFL - Light Touch RLE Coordination: WFL - gross motor Left Lower Extremity Assessment LLE ROM/Strength/Tone: Deficits;Due to pain LLE ROM/Strength/Tone Deficits: 3/5 throughout due to pain. LLE Sensation: WFL - Light Touch LLE Coordination: WFL - gross motor Trunk Assessment Trunk Assessment: Normal   Balance Balance Balance Assessed: No  End of Session PT - End of Session Equipment Utilized During Treatment: Gait belt Activity Tolerance: Patient tolerated treatment well Patient left: in chair;with call bell/phone within reach Nurse Communication: Mobility status  GP     Cephus Shelling 05/16/2012, 8:36  AM  05/16/2012 Cephus Shelling, PT, DPT [phone]

## 2012-05-16 NOTE — Progress Notes (Signed)
Subjective: Doing well.  Pain controlled.     Objective: Vital signs in last 24 hours: Temp:  [97.6 F (36.4 C)-98.1 F (36.7 C)] 97.6 F (36.4 C) (12/05 0651) Pulse Rate:  [62-79] 62  (12/05 0651) Resp:  [11-22] 18  (12/05 0800) BP: (116-128)/(54-74) 128/65 mmHg (12/05 0651) SpO2:  [95 %-100 %] 95 % (12/05 0800)  Intake/Output from previous day: 12/04 0701 - 12/05 0700 In: 2300 [I.V.:2050; IV Piggyback:250] Out: 1325 [Urine:1000; Blood:325] Intake/Output this shift:     Basename 05/16/12 0635  HGB 9.2*    Basename 05/16/12 0635  WBC 5.3  RBC 3.20*  HCT 27.4*  PLT 174    Basename 05/16/12 0635  NA 136  K 3.7  CL 101  CO2 29  BUN 8  CREATININE 0.82  GLUCOSE 101*  CALCIUM 8.3*    Basename 05/16/12 0635  LABPT --  INR 1.13    Exam:  Dressing c/d/i.  Calf nt, nvi.    Assessment/Plan: Anticipate d/c home tomorrow. Saline lock iv. D/c pca.   Derreck Wiltsey M 05/16/2012, 11:29 AM

## 2012-05-16 NOTE — Anesthesia Postprocedure Evaluation (Signed)
  Anesthesia Post-op Note  Patient: Robert [name]  Procedure(s) Performed: Procedure(s) (LRB) with comments: TOTAL HIP REVISION (Left)  Patient Location: PACU and SICU  Anesthesia Type:General  Level of Consciousness: awake, alert , oriented and patient cooperative  Airway and Oxygen Therapy: Patient Spontanous Breathing  Post-op Pain: mild  Post-op Assessment: Post-op Vital signs reviewed, Patient's Cardiovascular Status Stable, Respiratory Function Stable, Patent Airway, No signs of Nausea or vomiting and Pain level controlled  Post-op Vital Signs: stable  Complications: No apparent anesthesia complications

## 2012-05-16 NOTE — Progress Notes (Signed)
Occupational Therapy Treatment Patient Details Name: Robert [name] MRN: [id number] DOB: 04-25-1951 Today's Date: 05/16/2012 Time: 0820-0917 OT Time Calculation (min): 57 min  OT Assessment / Plan / Recommendation Comments on Treatment Session      Follow Up Recommendations  No OT follow up    Barriers to Discharge       Equipment Recommendations  None recommended by OT    Recommendations for Other Services    Frequency     Plan      Precautions / Restrictions Precautions Precautions: Posterior Hip Precaution Booklet Issued: Yes (comment) Precaution Comments: Educated pt on 3/3 posterior hip precautions. Restrictions Weight Bearing Restrictions: Yes LLE Weight Bearing: Weight bearing as tolerated   Pertinent Vitals/Pain Discomfort and soreness    ADL  Grooming: Wash/dry face;Teeth care;Independent Where Assessed - Grooming: Unsupported standing Upper Body Bathing: Chest;Right arm;Left arm;Abdomen;Independent Where Assessed - Upper Body Bathing: Unsupported standing Lower Body Bathing: Modified independent Where Assessed - Lower Body Bathing: Unsupported standing Upper Body Dressing: Independent Where Assessed - Upper Body Dressing: Unsupported standing Lower Body Dressing: Modified independent Where Assessed - Lower Body Dressing: Unsupported sit to stand Toilet Transfer: Modified independent Toilet Transfer Method: Sit to stand Toilet Transfer Equipment: Raised toilet seat with arms (or 3-in-1 over toilet) Equipment Used: Rolling walker Transfers/Ambulation Related to ADLs: Pt ambulating mod I with RW good hand placement and sequence ADL Comments: Pt completed a a full adl at the sink in the bathroom (washed hair, shaved with electric razor, UB/ LB bathing, dressing with reacher to don shorts and sit<>Stand from 3n1. pt is at adequate level for d/c home. pt wishes to start PO medication and RN Claris Che informed. Pt educated that no acute OT needs at this time.    OT  Diagnosis:    OT Problem List:   OT Treatment Interventions:     OT Goals    Visit Information  Last OT Received On: 05/16/12 Assistance Needed: +1    Subjective Data  Subjective: "last time I washed my hair at the sink. Can I do that again?"- pt requesting bath in restroom Patient Stated Goal: to go home soon I hope   Prior Functioning  Home Living Lives With: Spouse;Daughter Available Help at Discharge: Family;Available 24 hours/day Type of Home: House Home Access: Stairs to enter [employer] of Steps: 2 Entrance Stairs-Rails: Left Home Layout: Two level;Able to live on main level with bedroom/bathroom Bathroom Shower/Tub: Walk-in shower;Door Bathroom Toilet: Handicapped height Home Adaptive Equipment: Walker - rolling Prior Function Level of Independence: Independent Able to Take Stairs?: Yes Driving: Yes Vocation: Full time employment Communication Communication: No difficulties Dominant Hand: Right    Cognition  Overall Cognitive Status: Appears within functional limits for tasks assessed/performed Arousal/Alertness: Awake/alert Orientation Level: Appears intact for tasks assessed Behavior During Session: [facility] for tasks performed    Mobility  Shoulder Instructions Bed Mobility Bed Mobility: Not assessed Supine to Sit: 4: Min assist;HOB flat;With rails Details for Bed Mobility Assistance: Assist for left LE due to pain as well as to trunk. Cues for sequence to protect left hip. Transfers Sit to Stand: 5: Supervision;With upper extremity assist;From chair/3-in-1 Stand to Sit: 5: Supervision;With upper extremity assist;To chair/3-in-1 Details for Transfer Assistance: adequate level for d/c planning       Exercises  Total Joint Exercises Ankle Circles/Pumps: AROM;Left;10 reps;Supine Quad Sets: AROM;Left;10 reps;Supine Short Arc Quad: AROM;Left;10 reps;Supine Heel Slides: AROM;Left;10 reps;Supine Hip ABduction/ADduction: AROM;Left;10  reps;Supine   Balance Balance Balance Assessed: No  End of Session OT - End of Session Activity Tolerance: Patient tolerated treatment well Patient left: in chair;with call bell/phone within reach Nurse Communication: Mobility status;Precautions  GO     Lucile Shutters 05/16/2012, 11:03 AM Pager: [phone]

## 2012-05-16 NOTE — Progress Notes (Signed)
CARE MANAGEMENT NOTE 05/16/2012  Patient:  Robert [name], Robert [name]   Account Number:  [id number]  Date Initiated:  05/16/2012  Documentation initiated by:  Vance Peper  Subjective/Objective Assessment:   61 yr old male s/p left hip revision     Action/Plan:   CM spoke with patient and wife concerning home health and DME needs at discharge. pateint preoperatively setup with [facility], no changes.Patient has rolling walkerx2 and 3in1. Has family support.   Anticipated DC Date:  05/17/2012   Anticipated DC Plan:  HOME W HOME HEALTH SERVICES      DC Planning Services  CM consult      [facility] Choice  HOME HEALTH   Choice offered to / List presented to:  C-1 Patient        HH arranged  HH-2 PT  HH-1 RN      [facility] agency  [facility]   Status of service:  Completed, signed off Medicare Important Message given?   (If response is "NO", the following Medicare IM given date fields will be blank) Date Medicare IM given:   Date Additional Medicare IM given:    Discharge Disposition:  HOME W HOME HEALTH SERVICES  Per UR Regulation:    If discussed at Long Length of Stay Meetings, dates discussed:    Comments:

## 2012-05-16 NOTE — Op Note (Signed)
NAMESATORU, MILICH NO.:  [id number]  MEDICAL RECORD NO.:  [id number]  LOCATION:  [id number]                        FACILITY:  [facility]  PHYSICIAN:  Loreta Ave, M.D. 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  Now with significant polyethylene wear, symptomatic.  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  Now with significant polyethylene wear, symptomatic with associated significant osteolysis throughout the acetabulum posterior as well as down into the usual rami as a result of debris from polyethylene wear.  Well fixed femoral stem.  PROCEDURE:  Left total hip revision.  Revision of the femoral component to a 28+ 10 head preserving the stem.  Complete excision of the entire acetabular component including polyethylene and middle socket as well as the screws.  Extensive debridement of reactive tissue from polyethylene breakdown.  Curettage and then bone grafting of pelvis throughout the ischial rami, medial wall, posterior aspect with cancellous allograft. Conversion to a 56-mm titanium reconstruction cup.  Screw fixation x3. A 42-mm internal diameter mental liner followed by a 42-mm MDM polyethylene head attached to the femoral head.  SURGEON-Diron Haddon:  Loreta Ave, M.D.  ASSISTANT:  Genene Churn. Barry Dienes, [place], present throughout the entire case and necessary for timely completion of procedure.  ANESTHESIA:  General.  BLOOD LOSS:  400 mL.  BLOOD GIVEN:  None.  SPECIMENS:  None.  CULTURES:  The clear joint fluid was sent for culture, but concern of infection was very low.  COMPLICATION:  None.  DRESSINGS:  Soft compressive with abduction pillow.  PROCEDURE:  The patient was brought to the operating room and placed on the  operating table in supine position.  After adequate anesthesia been obtained, turned to a lateral position, left side up.  Prepped and draped in usual sterile fashion.  Incision was made utilizing a previous incision along the shaft of femur extending posterosuperior.  Skin and subcutaneous tissues were divided.  Hemostasis with cautery.  Iliotibial band exposed and incised.  Charnley retractor was put in place. Neurovascular structures were protected throughout.  External tissues were normal.  External rotator intact.  Capsule was taken down off the back of the intertrochanteric groove of the femur.  Fair amount of clear joint fluid.  Extensive reactive debris from polyethylene breakdown, but nothing to suggest infection.  The head was removed from the femoral component.  The shaft was assessed and well fixed.  The acetabulum was exposed.  I extracted the polyethylene component by drilling a screw through the polyethylene and then having a backout assistant using a Cobb.  The metallic component was well fixed, but vertical and there were erosive changes that you could see on the posterior aspect.  The screws were removed.  I used the extraction device, carefully removing the entire metallic acetabular component.  This was accomplished without having to take any bone with a component.  It was well fixed.  Once that was complete, the acetabulum was exposed.  Much more  extensive osteolysis than I anticipated based on preoperative films.  All debris cleared out.  The osteolysis was extended well down the ischial rami where there was a lot of reactive debris from polyethylene breakdown. Created a good bleeding bone.  Medial wall was very thin, but still adequate to use a standard cup.  Superior acetabulum was still in excellent condition for support.  Some thinning of the posterior wall. After this was completely cleared out, brought up to a 56-mm component with appropriate reamers.  I then  packed allograft down in the rami as well as throughout the acetabulum including the medial and posterior aspects.  This was packed in place with the reamer on reverse.  I then inserted 56-mm cup, hammered in place at 45 degrees of abduction, 20 degrees of anteversion.  Fairly good capturing of fixation especially given a degree of osteolysis.  It was fixed with three screws through the cup, placed superiorly.  I entered appropriate trials, I then put in the 42-mm metallic liner in the acetabular metallic cup.  A 28+ 10 head attached to the femoral component with a 42-mm MDM polyethylene head attached.  Hip was reduced.  Excellent stability in flexion and extension.  Equal leg lengths.  Wound was copiously irrigated throughout the entire procedure.  External rotator and capsule were repaired to the back of the intertrochanteric groove through drill holes, tied over bony bridge.  Charnley retractor was removed.  Iliotibial band was closed with #1 Vicryl, skin and subcutaneous tissue with Vicryl and staples. Margins were injected with Marcaine.  Sterile compressive dressing was applied.  Returned to supine position.  Anesthesia was reversed. Brought to the recovery room.  Tolerated the surgery well.  No complications.     Loreta Ave, M.D.     DFM/MEDQ  D:  05/16/2012  T:  05/16/2012  Job:  [id number]

## 2012-05-16 NOTE — Progress Notes (Signed)
Physical Therapy Treatment Patient Details Name: Robert [name] MRN: [id number] DOB: 1950-11-09 Today's Date: 05/16/2012 Time: 4098-1191 PT Time Calculation (min): 25 min  PT Assessment / Plan / Recommendation Comments on Treatment Session  Patient continues to progress well. Should be ready for DC tomorrow AM according to PT goals set    Follow Up Recommendations  Home health PT     Does the patient have the potential to tolerate intense rehabilitation     Barriers to Discharge        Equipment Recommendations  None recommended by PT;None recommended by OT    Recommendations for Other Services    Frequency 7X/week   Plan Discharge plan remains appropriate;Frequency remains appropriate    Precautions / Restrictions Precautions Precautions: Posterior Hip Precaution Comments: Patient able to recall all precautions Restrictions LLE Weight Bearing: Weight bearing as tolerated   Pertinent Vitals/Pain     Mobility  Bed Mobility Bed Mobility: Sit to Supine Sit to Supine: 4: Min guard Details for Bed Mobility Assistance: increased time to elevate LEs into bed Transfers Sit to Stand: 5: Supervision;With upper extremity assist Stand to Sit: 5: Supervision;With upper extremity assist Details for Transfer Assistance: Cues for safe hand placement Ambulation/Gait Ambulation/Gait Assistance: 5: Supervision Ambulation Distance (Feet): 300 Feet Assistive device: Rolling walker Gait Pattern: Step-through pattern;Decreased stride length Stairs: Yes Stairs Assistance: 4: Min guard Stair Management Technique: Step to pattern;Backwards;Forwards;Two rails Number of Stairs: 4  (1x forward/1x backward)    Exercises Total Joint Exercises Heel Slides: AROM;Left;10 reps Hip ABduction/ADduction: AROM;Left;10 reps   PT Diagnosis:    PT Problem List:   PT Treatment Interventions:     PT Goals Acute Rehab PT Goals PT Goal: Sit to Supine/Side - Progress: Progressing toward goal PT Goal:  Sit to Stand - Progress: Progressing toward goal PT Goal: Stand to Sit - Progress: Progressing toward goal PT Goal: Ambulate - Progress: Progressing toward goal PT Goal: Up/Down Stairs - Progress: Progressing toward goal PT Goal: Perform Home Exercise Program - Progress: Progressing toward goal  Visit Information  Last PT Received On: 05/16/12 Assistance Needed: +1    Subjective Data      Cognition  Overall Cognitive Status: Appears within functional limits for tasks assessed/performed Arousal/Alertness: Awake/alert Orientation Level: Appears intact for tasks assessed Behavior During Session: [facility] for tasks performed    Balance     End of Session PT - End of Session Equipment Utilized During Treatment: Gait belt Activity Tolerance: Patient tolerated treatment well Patient left: in chair;in bed;with call bell/phone within reach Nurse Communication: Mobility status   GP     Fredrich Birks 05/16/2012, 3:00 PM 05/16/2012 Fredrich Birks PTA [phone] pager [phone] office

## 2012-05-16 NOTE — Progress Notes (Signed)
ANTICOAGULATION CONSULT NOTE - Follow up Consult  Pharmacy Consult for Coumadin Indication: VTE prophylaxis s/p total hip revision  No Known Allergies Patient Measurements: Weight 90.4 kg on 05/06/12 Height 178 cm on 05/06/12 Vital Signs: Temp: 97.6 F (36.4 C) (12/05 0651) BP: 128/65 mmHg (12/05 0651) Pulse Rate: 62  (12/05 0651) Labs: Baseline labs from 05/06/12: INR 1, H/H 12.2/36.9, Plts 271, AST/ALT 20/12, Alk Phos 137, SCr 0.84, Albumin 3.6  Medical History: Past Medical History  Diagnosis Date  . History of blood transfusion     autologus  . Complication of anesthesia     shivering, hypothermia after THA '93 with GA  . H/O cardiovascular stress test     results good, [facility], done as a baseline  Sept. 2013  . Asthma     rare use of ventolin inhaler, last time a month  or more ago   . Pneumonia     hosp. as a child   . Arthritis     heredity - hip dysplasia, OA   Medications:  Scheduled:     . atorvastatin  20 mg Oral QHS  . [COMPLETED]  ceFAZolin (ANCEF) IV  1 g Intravenous Q8H  . [COMPLETED]  ceFAZolin (ANCEF) IV  2 g Intravenous 60 min Pre-Op  . coumadin book   Does not apply Once  . docusate sodium  100 mg Oral BID  . enoxaparin (LOVENOX) injection  40 mg Subcutaneous Q24H  . [EXPIRED] HYDROmorphone      . [EXPIRED] HYDROmorphone      . morphine   Intravenous Q4H  . niacin  500 mg Oral QHS  . [COMPLETED] warfarin  7.5 mg Oral ONCE-1800  . warfarin   Does not apply Once  . Warfarin - Pharmacist Dosing Inpatient   Does not apply q1800    Assessment: 61 y/o male patient s/p left total hip revision receiving Coumadin for VTE prophylaxis. Baseline INR as well as CBC and LFTS were wnl on 05/06/12. No overt bleeding reported. Warfarin score is 7. Patient is also on Lovenox 40 SQ q24h.  Goal of Therapy:  INR 2-3   Plan:  1. Repeat Coumadin 7.5mg  po x1 tonight. 2. Follow-up daily PT/INR.   Verlene Mayer, PharmD, BCPS Pager [phone] 05/16/2012,9:05  AM

## 2012-05-17 LAB — PROTIME-INR: Prothrombin Time: 15.5 seconds — ABNORMAL HIGH (ref 11.6–15.2)

## 2012-05-17 LAB — CBC
Hemoglobin: 9.1 g/dL — ABNORMAL LOW (ref 13.0–17.0)
MCH: 28.1 pg (ref 26.0–34.0)
MCHC: 32.7 g/dL (ref 30.0–36.0)
MCV: 85.8 fL (ref 78.0–100.0)
RBC: 3.24 MIL/uL — ABNORMAL LOW (ref 4.22–5.81)

## 2012-05-17 MED ORDER — OXYCODONE-ACETAMINOPHEN 7.5-325 MG PO TABS: 1.0000 | ORAL_TABLET | ORAL | Status: DC | PRN

## 2012-05-17 MED ORDER — METHOCARBAMOL 500 MG PO TABS: 500.0000 mg | ORAL_TABLET | Freq: Four times a day (QID) | ORAL | Status: DC | PRN

## 2012-05-17 MED ORDER — WARFARIN SODIUM 10 MG PO TABS
10.0000 mg | ORAL_TABLET | Freq: Once | ORAL | Status: AC
Start: 2012-05-17 — End: 2012-05-17
  Administered 2012-05-17: 10 mg via ORAL
  Filled 2012-05-17: qty 1

## 2012-05-17 MED ORDER — ENOXAPARIN SODIUM 40 MG/0.4ML ~~LOC~~ SOLN: 40.0000 mg | SUBCUTANEOUS | Status: DC

## 2012-05-17 MED ORDER — WARFARIN SODIUM 5 MG PO TABS: 5.0000 mg | ORAL_TABLET | Freq: Every day | ORAL | Status: DC

## 2012-05-17 NOTE — Progress Notes (Signed)
PT Treatment Note:   05/17/12 1139  PT Visit Information  Last PT Received On 05/17/12  Assistance Needed +1  PT Time Calculation  PT Start Time 1128  PT Stop Time 1138  PT Time Calculation (min) 10 min  Subjective Data  Subjective "My left leg got twisted putting on those compression socks."  Patient Stated Goal Go home.  Precautions  Precautions Posterior Hip  Precaution Booklet Issued No  Precaution Comments Patient able to recall all precautions  Restrictions  Weight Bearing Restrictions Yes  LLE Weight Bearing WBAT  Cognition  Overall Cognitive Status Appears within functional limits for tasks assessed/performed  Arousal/Alertness Awake/alert  Orientation Level Appears intact for tasks assessed  Behavior During Session [facility] for tasks performed  Bed Mobility  Bed Mobility Not assessed  Transfers  Transfers Not assessed  Ambulation/Gait  Ambulation/Gait Assistance Not tested (comment)  Stairs No  Wheelchair Mobility  Wheelchair Mobility No  Balance  Balance Assessed No  Exercises  Exercises Total Joint  Total Joint Exercises  Ankle Circles/Pumps AROM;Left;10 reps;Supine  Quad Sets AROM;Left;10 reps;Supine  Short Arc Quad AROM;Left;10 reps;Supine  Heel Slides AROM;Left;10 reps  Hip ABduction/ADduction AROM;Left;10 reps;Supine  PT - End of Session  Equipment Utilized During Treatment Gait belt  Activity Tolerance Patient tolerated treatment well  Patient left in bed;with call bell/phone within reach  Nurse Communication Mobility status  PT - Assessment/Plan  Comments on Treatment Session Pt admitted s/p left THA and is ready for safe d/c home once medically cleared by MD. Pt able to tolerate supine therapuetic exercises and reports ambulating in room several times this am. Pt also reports left LE getting twisted when donning compression socks causing increase in pain and limitations to mobility currently. Examined left hip, which is still correctly alligned in joint.   PT Plan Discharge plan remains appropriate;Frequency remains appropriate  PT Frequency 7X/week  Follow Up Recommendations Home health PT  Equipment Recommended None recommended by PT;None recommended by OT  Acute Rehab PT Goals  PT Goal Formulation With patient  Time For Goal Achievement 05/23/12  Potential to Achieve Goals Good  PT Goal: Perform Home Exercise Program - Progress Progressing toward goal  PT General Charges  $$ ACUTE PT VISIT 1 Procedure  PT Treatments  $Therapeutic Exercise 8-22 mins    Pain:  3/10 in left hip. Pt repositioned.  05/17/2012 Cephus Shelling, PT, DPT [phone]

## 2012-05-17 NOTE — Progress Notes (Signed)
Physical Therapy Treatment Patient Details Name: Robert [name] MRN: [id number] DOB: [birth year] Today's Date: 05/17/2012 Time: 7829-5621 PT Time Calculation (min): 23 min  PT Assessment / Plan / Recommendation Comments on Treatment Session  Pt admitted s/p left THA and is progressing great with therapy. Pt able to increase ambulation distance and independence with mobility as well as negotiate stairs. Pt ready for safe d/c home once medically cleared by MD.    Follow Up Recommendations  Home health PT     Does the patient have the potential to tolerate intense rehabilitation     Barriers to Discharge        Equipment Recommendations  None recommended by PT;None recommended by OT    Recommendations for Other Services    Frequency 7X/week   Plan Discharge plan remains appropriate;Frequency remains appropriate    Precautions / Restrictions Precautions Precautions: Posterior Hip Precaution Booklet Issued: No Precaution Comments: Patient able to recall all precautions Restrictions Weight Bearing Restrictions: Yes LLE Weight Bearing: Weight bearing as tolerated   Pertinent Vitals/Pain 2/10 in left hip. Pt repositioned.    Mobility  Bed Mobility Bed Mobility: Not assessed Transfers Transfers: Sit to Stand;Stand to Sit Sit to Stand: 6: Modified independent (Device/Increase time);With upper extremity assist;From chair/3-in-1 Stand to Sit: 6: Modified independent (Device/Increase time);With upper extremity assist;To chair/3-in-1 Ambulation/Gait Ambulation/Gait Assistance: 5: Supervision Ambulation Distance (Feet): 320 Feet Assistive device: Rolling walker Ambulation/Gait Assistance Details: Verbal cues for tall posture and safe sequence with attempts at step-through sequence. Gait Pattern: Step-through pattern;Decreased step length - left;Decreased stance time - left;Trunk flexed Stairs: Yes Stairs Assistance: 5: Supervision Stairs Assistance Details (indicate cue type and  reason): Verbal cues for safest sequence using "up with good, down with bad." Stair Management Technique: Step to pattern;Sideways;One rail Left Number of Stairs: 4  Wheelchair Mobility Wheelchair Mobility: No    Exercises Total Joint Exercises Hip ABduction/ADduction: AROM;Left;10 reps;Standing Knee Flexion: AROM;Left;10 reps;Standing Marching in Standing: AROM;Left;10 reps;Seated Standing Hip Extension: AROM;Left;10 reps;Standing   PT Diagnosis:    PT Problem List:   PT Treatment Interventions:     PT Goals Acute Rehab PT Goals PT Goal Formulation: With patient Time For Goal Achievement: 05/23/12 Potential to Achieve Goals: Good PT Goal: Sit to Stand - Progress: Met PT Goal: Stand to Sit - Progress: Met PT Goal: Ambulate - Progress: Progressing toward goal PT Goal: Up/Down Stairs - Progress: Progressing toward goal PT Goal: Perform Home Exercise Program - Progress: Progressing toward goal  Visit Information  Last PT Received On: 05/17/12 Assistance Needed: +1    Subjective Data  Subjective: "Ready to do this." Patient Stated Goal: Go home.   Cognition  Overall Cognitive Status: Appears within functional limits for tasks assessed/performed Arousal/Alertness: Awake/alert Orientation Level: Appears intact for tasks assessed Behavior During Session: [facility] for tasks performed    Balance  Balance Balance Assessed: No  End of Session PT - End of Session Equipment Utilized During Treatment: Gait belt Activity Tolerance: Patient tolerated treatment well Patient left: in chair;with call bell/phone within reach Nurse Communication: Mobility status   GP     Cephus Shelling 05/17/2012, 8:08 AM  05/17/2012 Cephus Shelling, PT, DPT [phone]

## 2012-05-17 NOTE — Progress Notes (Signed)
ANTICOAGULATION CONSULT NOTE - Follow up Consult  Pharmacy Consult for Coumadin Indication: VTE prophylaxis s/p total hip revision  No Known Allergies Patient Measurements: Weight 90.4 kg on 05/06/12 Height 178 cm on 05/06/12 Vital Signs:   Labs: Baseline labs from 05/06/12: INR 1, H/H 12.2/36.9, Plts 271, AST/ALT 20/12, Alk Phos 137, SCr 0.84, Albumin 3.6  Medical History: Past Medical History  Diagnosis Date  . History of blood transfusion     autologus  . Complication of anesthesia     shivering, hypothermia after THA '93 with GA  . H/O cardiovascular stress test     results good, [facility], done as a baseline  Sept. 2013  . Asthma     rare use of ventolin inhaler, last time a month  or more ago   . Pneumonia     hosp. as a child   . Arthritis     heredity - hip dysplasia, OA  . Hyperlipidemia    Medications:  Scheduled:     . atorvastatin  20 mg Oral QHS  . coumadin book   Does not apply Once  . docusate sodium  100 mg Oral BID  . enoxaparin (LOVENOX) injection  40 mg Subcutaneous Q24H  . niacin  500 mg Oral QHS  . [COMPLETED] warfarin  7.5 mg Oral ONCE-1800  . warfarin   Does not apply Once  . Warfarin - Pharmacist Dosing Inpatient   Does not apply q1800  . [DISCONTINUED] morphine   Intravenous Q4H    Assessment: 61 y/o male patient s/p left total hip revision receiving Coumadin for VTE prophylaxis.  No overt bleeding reported. INR is subtherapeutic at 1.25.  Warfarin score is 7. Patient is also on Lovenox 40 SQ q24h.  Goal of Therapy:  INR 2-3   Plan:  1. Coumadin 10mg  po x 1 dose tonight.  2. Follow-up daily PT/INR.   Wendie Simmer, PharmD, BCPS Clinical Pharmacist  Pager: [phone]

## 2012-05-17 NOTE — Progress Notes (Signed)
Subjective: Doing well.  Pain controlled.  Ready to go home.  Objective: Vital signs in last 24 hours: Temp:  [99.1 F (37.3 C)] 99.1 F (37.3 C) (12/06 1300) Pulse Rate:  [86] 86  (12/06 1300) Resp:  [18] 18  (12/06 1300) BP: (100)/(54) 100/54 mmHg (12/06 1300) SpO2:  [99 %] 99 % (12/06 1300)  Intake/Output from previous day: 12/05 0701 - 12/06 0700 In: 480 [P.O.:480] Out: 400 [Urine:400] Intake/Output this shift:     Basename 05/17/12 0600 05/16/12 0635  HGB 9.1* 9.2*    Basename 05/17/12 0600 05/16/12 0635  WBC 5.7 5.3  RBC 3.24* 3.20*  HCT 27.8* 27.4*  PLT 195 174    Basename 05/16/12 0635  NA 136  K 3.7  CL 101  CO2 29  BUN 8  CREATININE 0.82  GLUCOSE 101*  CALCIUM 8.3*    Basename 05/17/12 0600 05/16/12 0635  LABPT -- --  INR 1.25 1.13   Hip wound looks good.  Staples intact.  No signs of infection.  nvi .  Calf nt.   Assessment/Plan: D/c home today.     Robert [name] 05/17/2012, 1:57 PM

## 2012-05-18 LAB — BODY FLUID CULTURE: Gram Stain: NONE SEEN

## 2012-05-20 LAB — ANAEROBIC CULTURE

## 2012-05-22 NOTE — Discharge Summary (Signed)
  ABBREVIATED DISCHARGE SUMMARY      DATE OF HOSPITALIZATION:  15 May 2012  REASON FOR HOSPITALIZATION:  61 yo wm with left total hip poly wear and chronic pain.  Failed conservative treatment.    SIGNIFICANT FINDINGS:  Left hip poly wear  OPERATION:  Left total hip revision  FINAL DIAGNOSIS:  same  SECONDARY DIAGNOSIS: none  CONSULTANTS:  none  DISCHARGE CONDITION:  STABLE  DISCHARGED TO:  HOME

## 2012-08-09 ENCOUNTER — Encounter: Payer: Self-pay | Admitting: Gastroenterology

## 2012-09-19 ENCOUNTER — Encounter: Payer: Self-pay | Admitting: Gastroenterology

## 2012-11-06 ENCOUNTER — Ambulatory Visit (AMBULATORY_SURGERY_CENTER): Payer: BC Managed Care – PPO | Admitting: *Deleted

## 2012-11-06 VITALS — Ht 70.0 in | Wt 205.0 lb

## 2012-11-06 DIAGNOSIS — Z1211 Encounter for screening for malignant neoplasm of colon: Secondary | ICD-10-CM

## 2012-11-06 DIAGNOSIS — Z800 Family history of malignant neoplasm of digestive organs: Secondary | ICD-10-CM

## 2012-11-06 MED ORDER — MOVIPREP 100 G PO SOLR: ORAL | Status: DC

## 2012-11-06 NOTE — Progress Notes (Signed)
Patient denies any allergies to eggs or soy. 

## 2012-11-07 ENCOUNTER — Encounter: Payer: Self-pay | Admitting: *Deleted

## 2012-11-07 ENCOUNTER — Encounter: Payer: Self-pay | Admitting: Gastroenterology

## 2012-11-07 ENCOUNTER — Encounter: Payer: Self-pay | Admitting: Physician Assistant

## 2012-11-07 ENCOUNTER — Ambulatory Visit (INDEPENDENT_AMBULATORY_CARE_PROVIDER_SITE_OTHER): Payer: BC Managed Care – PPO | Admitting: Physician Assistant

## 2012-11-07 VITALS — BP 130/80 | HR 63 | Ht 70.0 in | Wt 203.8 lb

## 2012-11-07 DIAGNOSIS — T148XXA Other injury of unspecified body region, initial encounter: Secondary | ICD-10-CM | POA: Insufficient documentation

## 2012-11-07 DIAGNOSIS — E785 Hyperlipidemia, unspecified: Secondary | ICD-10-CM

## 2012-11-07 NOTE — Progress Notes (Signed)
Date:  11/07/2012   ID:  Robert [name], DOB 1950-10-18, MRN [id number]  PCP:  Charolett Bumpers, MD  Primary Cardiologist:  Nicholaus Bloom     History of Present Illness: Robert [name] is a 62 y.o. 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 His trunk and history of premature coronary disease. His history includes hyperlipidemia, asthma, arthritis, hip replacement 20 years ago and he was supposed to be rescheduled for repeat hip surgery. Patient the nuclear stress test September 2013 which was nonischemic. Patient presents today with chest pain which is in mid axillary line. It was a 4-5/10 in intensity patient reports it would come and go it started last night. He described as an achy feeling. 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 He denies any diaphoresis, shortness of breath, nausea, vomiting, fever. He did say that his stomach was upset last night and had some diarrhea this morning. He is wondering if this pain was related to gas.   Wt Readings from Last 3 Encounters:  11/07/12 203 lb 12.8 oz (92.443 kg)  11/06/12 205 lb (92.987 kg)  05/06/12 199 lb 6.4 oz (90.447 kg)     Past Medical History  Diagnosis Date  . History of blood transfusion     autologus  . Complication of anesthesia     shivering, hypothermia after THA '93 with GA  . H/O cardiovascular stress test     results good, [facility], done as a baseline  Sept. 2013  . Asthma     rare use of ventolin inhaler, last time a month  or more ago   . Pneumonia     hosp. as a child   . Arthritis     heredity - hip dysplasia, OA  . Hyperlipidemia     Current Outpatient Prescriptions  Medication Sig Dispense Refill  . aspirin 81 MG tablet Take 81 mg by mouth daily.      Marland Kitchen atorvastatin (LIPITOR) 40 MG tablet Take 20 mg by mouth at bedtime.       . Calcium Carbonate Antacid (TUMS PO) Take by mouth daily as needed.      . L-ARGININE PO Take by mouth as needed.       . meloxicam (MOBIC) 15 MG tablet Take 15 mg by mouth as needed.       Marland Kitchen MOVIPREP 100 G SOLR Take as directed.  1 kit  0  . Multiple Vitamin (MULTI-VITAMIN PO) Take by mouth.      . niacin (NIASPAN) 500 MG CR tablet Take 500 mg by mouth at bedtime.      . Omega-3 Fatty Acids (FISH OIL PO) Take by mouth.      . VENTOLIN HFA 108 (90 BASE) MCG/ACT inhaler Take 1 puff by mouth daily as needed.       No current facility-administered medications for this visit.    Allergies:    Allergies  Allergen Reactions  . Sulfa Antibiotics     Social History:  The patient  reports that he has never smoked. He has never used smokeless tobacco. He reports that he drinks about 1.2 ounces of alcohol per week. He reports that he does not use illicit drugs.   ROS:  Please see the history of present illness.  All other systems reviewed and negative.   PHYSICAL EXAM: VS:  BP 130/80  Pulse 63  Ht 5\' 10"  (1.778 m)  Wt 203 lb 12.8 oz (92.443 kg)  BMI 29.24 kg/m2 Well nourished, well developed, in no acute distress HEENT: Pupils are equal round react to light accommodation extraocular movements are intact.  Cardiac: Regular rate and rhythm without murmurs rubs or gallops. Chest wall: The patient had no pain with active shoulder movements there anterior posteriorly adduction or abduction, however with palpation along serratus muscles he did have a serious tenderness. This is also exacerbated with flexion of these muscles. Lungs:  clear to auscultation bilaterally, no wheezing, rhonchi or rales Abd: positive bowel sounds all quadrants, Ext: no lower extremity edema.  2+ radial and dorsalis pedis pulses. Skin: warm and dry Neuro:  Grossly normal  EKG:  Sinus rhythm with repolarization abnormality and lateral leads.  Rate 63 beats per minute   ASSESSMENT AND PLAN:  Problem List Items Addressed This Visit   Pulled muscle: Likely Serratus muscle in the left mid axillary line.     The patient currently takes  meloxicam on a when necessary basis. He will take that as needed for this particular problem.    Hyperlipidemia - Primary

## 2012-11-07 NOTE — Patient Instructions (Signed)
Take ibuprofen 600 mg twice daily as needed

## 2012-11-07 NOTE — Assessment & Plan Note (Signed)
The patient currently takes meloxicam on a when necessary basis. He will take that as needed for this particular problem.

## 2012-11-19 ENCOUNTER — Encounter: Payer: Self-pay | Admitting: Cardiovascular Disease

## 2012-11-20 ENCOUNTER — Encounter: Payer: BC Managed Care – PPO | Admitting: Gastroenterology

## 2012-11-22 ENCOUNTER — Encounter: Payer: BC Managed Care – PPO | Admitting: Gastroenterology

## 2012-12-04 ENCOUNTER — Encounter: Payer: Self-pay | Admitting: Gastroenterology

## 2012-12-04 ENCOUNTER — Ambulatory Visit (AMBULATORY_SURGERY_CENTER): Payer: BC Managed Care – PPO | Admitting: Gastroenterology

## 2012-12-04 VITALS — BP 133/79 | HR 62 | Temp 97.1°F | Resp 19 | Ht 70.0 in | Wt 205.0 lb

## 2012-12-04 DIAGNOSIS — D126 Benign neoplasm of colon, unspecified: Secondary | ICD-10-CM

## 2012-12-04 DIAGNOSIS — K5730 Diverticulosis of large intestine without perforation or abscess without bleeding: Secondary | ICD-10-CM

## 2012-12-04 DIAGNOSIS — Z800 Family history of malignant neoplasm of digestive organs: Secondary | ICD-10-CM

## 2012-12-04 DIAGNOSIS — Z1211 Encounter for screening for malignant neoplasm of colon: Secondary | ICD-10-CM

## 2012-12-04 MED ORDER — SODIUM CHLORIDE 0.9 % IV SOLN: 500.0000 mL | INTRAVENOUS | Status: DC

## 2012-12-04 NOTE — Progress Notes (Signed)
Patient did not experience any of the following events: a burn prior to discharge; a fall within the facility; wrong site/side/patient/procedure/implant event; or a hospital transfer or hospital admission upon discharge from the facility. (G8907) Patient did not have preoperative order for IV antibiotic SSI prophylaxis. (G8918)  

## 2012-12-04 NOTE — Progress Notes (Signed)
Report to pacu rn, vss, bbs=clear 

## 2012-12-04 NOTE — Op Note (Addendum)
[facility] [address] [place], [address]   COLONOSCOPY PROCEDURE REPORT  PATIENT: Robert [name], Robert [name]  MR#: [id number] BIRTHDATE: 04-Dec-1950 , 62  yrs. old GENDER: Male ENDOSCOPIST: Mardella Layman, MD, Clementeen Graham REFERRED BY:  Theadora Rama, M.D. PROCEDURE DATE:  12/04/2012 PROCEDURE:   Colonoscopy, screening and Colonoscopy with snare polypectomy ASA CLASS:   Class II INDICATIONS:Patient's immediate family history of colon cancer. MEDICATIONS: propofol (Diprivan) 300mg  IV  DESCRIPTION OF PROCEDURE:   After the risks and benefits and of the procedure were explained, informed consent was obtained.  A digital rectal exam revealed no abnormalities of the rectum.    The [id number]  endoscope was introduced through the anus and advanced to the cecum, which was identified by both the appendix and ileocecal valve .  The quality of the prep was excellent, using MoviPrep .  The instrument was then slowly withdrawn as the colon was fully examined.     COLON FINDINGS: There was severe diverticulosis noted in the descending colon and sigmoid colon with associated angulation, muscular hypertrophy, colonic spasm and petechiae.  No bleeding was noted from the diverticulosis.   A smooth flat polyp ranging between 3-73mm in size was found at the cecum.  A polypectomy was performed using snare cautery.  The resection was complete and the polyp tissue was completely retrieved.   The colon was otherwise normal.  There was no diverticulosis, inflammation, polyps or cancers unless previously stated.     Retroflexed views revealed no abnormalities.     The scope was then withdrawn from the patient and the procedure completed.  COMPLICATIONS: There were no complications. ENDOSCOPIC IMPRESSION: 1.   There was severe diverticulosis noted in the descending colon and sigmoid colon 2   flat cecal polyp removed-rule out striated adenoma.    3.   The colon was otherwise  normal ..+ FH colon CA  RECOMMENDATIONS: 1.  Await pathology results 2.  Repeat Colonoscopy in 3-5 years.   REPEAT EXAM:  cc:  _______________________________ eSignedMardella Layman, MD, [facility] 12/04/2012 2:07 PM     PATIENT NAME:  Robert [name] MR#: [id number]

## 2012-12-04 NOTE — Patient Instructions (Addendum)

## 2012-12-04 NOTE — Progress Notes (Signed)
Called to room to assist during endoscopic procedure.  Patient ID and intended procedure confirmed with present staff. Received instructions for my participation in the procedure from the performing physician.  

## 2012-12-05 ENCOUNTER — Telehealth: Payer: Self-pay | Admitting: *Deleted

## 2012-12-05 NOTE — Telephone Encounter (Signed)
  Follow up Call-  Call back number 12/04/2012  Post procedure Call Back phone  # [phone]  Permission to leave phone message Yes     Patient questions:  Do you have a fever, pain , or abdominal swelling? no Pain Score  0 *  Have you tolerated food without any problems? yes  Have you been able to return to your normal activities? yes  Do you have any questions about your discharge instructions: Diet   no Medications  no Follow up visit  no  Do you have questions or concerns about your Care? no  Actions: * If pain score is 4 or above: No action needed, pain <4.

## 2012-12-09 ENCOUNTER — Encounter: Payer: Self-pay | Admitting: Gastroenterology

## 2013-01-02 ENCOUNTER — Encounter (HOSPITAL_COMMUNITY): Payer: Self-pay | Admitting: Pharmacy Technician

## 2013-01-03 ENCOUNTER — Other Ambulatory Visit: Payer: Self-pay | Admitting: Orthopedic Surgery

## 2013-01-03 ENCOUNTER — Encounter (HOSPITAL_COMMUNITY): Payer: Self-pay

## 2013-01-03 ENCOUNTER — Encounter (HOSPITAL_COMMUNITY)
Admission: RE | Admit: 2013-01-03 | Discharge: 2013-01-03 | Disposition: A | Discharge status: Home / Self Care | Payer: BC Managed Care – PPO | Source: Ambulatory Visit | Attending: Orthopedic Surgery | Admitting: Orthopedic Surgery

## 2013-01-03 DIAGNOSIS — Z01812 Encounter for preprocedural laboratory examination: Secondary | ICD-10-CM | POA: Insufficient documentation

## 2013-01-03 DIAGNOSIS — M25569 Pain in unspecified knee: Secondary | ICD-10-CM | POA: Insufficient documentation

## 2013-01-03 LAB — CBC
HCT: 41 % (ref 39.0–52.0)
Hemoglobin: 13.6 g/dL (ref 13.0–17.0)
MCHC: 33.2 g/dL (ref 30.0–36.0)
WBC: 6.2 10*3/uL (ref 4.0–10.5)

## 2013-01-03 LAB — SURGICAL PCR SCREEN
MRSA, PCR: NEGATIVE
Staphylococcus aureus: POSITIVE — AB

## 2013-01-03 LAB — URINALYSIS, ROUTINE W REFLEX MICROSCOPIC
Bilirubin Urine: NEGATIVE
Hgb urine dipstick: NEGATIVE
Nitrite: NEGATIVE
Specific Gravity, Urine: 1.024 (ref 1.005–1.030)
pH: 5.5 (ref 5.0–8.0)

## 2013-01-03 LAB — COMPREHENSIVE METABOLIC PANEL
Alkaline Phosphatase: 94 U/L (ref 39–117)
BUN: 16 mg/dL (ref 6–23)
Chloride: 101 mEq/L (ref 96–112)
GFR calc Af Amer: >90 mL/min (ref >90)
Glucose, Bld: 109 mg/dL — ABNORMAL HIGH (ref 70–99)
Potassium: 4.3 mEq/L (ref 3.5–5.1)
Total Bilirubin: 0.3 mg/dL (ref 0.3–1.2)

## 2013-01-03 LAB — APTT: aPTT: 31 seconds (ref 24–37)

## 2013-01-03 LAB — PROTIME-INR: Prothrombin Time: 13.1 seconds (ref 11.6–15.2)

## 2013-01-03 NOTE — Progress Notes (Signed)
Primary Physician - Dr. Oley Balm -  Cardiologist - Dr. Tresa Endo Stress test, ekg in epic from 2013

## 2013-01-03 NOTE — Pre-Procedure Instructions (Signed)
Robert [name]  01/03/2013   Your procedure is scheduled on:  Wednesday, August 6th  Report to [facility] at 0800 AM.  Call this number if you have problems the morning of surgery: [phone]   Remember:   Do not eat food or drink liquids after midnight.   Take these medicines the morning of surgery with A SIP OF WATER: Hydrocodone if needed, albuterol if needed. Stop taking aspirin and fish oil 5 days prior to surgery   Do not wear jewelry.  Do not wear lotions, powders, or perfume, deodorant.  Do not shave 48 hours prior to surgery. Men may shave face and neck.  Do not bring valuables to the hospital.  [facility] is not responsible  for any belongings or valuables.  Contacts, dentures or bridgework may not be worn into surgery.  Leave suitcase in the car. After surgery it may be brought to your room.  For patients admitted to the hospital, checkout time is 11:00 AM the day of discharge.   Special Instructions: Shower using CHG 2 nights before surgery and the night before surgery.  If you shower the day of surgery use CHG.  Use special wash - you have one bottle of CHG for all showers.  You should use approximately 1/3 of the bottle for each shower.   Please read over the following fact sheets that you were given: Pain Booklet, Coughing and Deep Breathing, Blood Transfusion Information, MRSA Information and Surgical Site Infection Prevention

## 2013-01-07 NOTE — Progress Notes (Signed)
Notified Dr. Greig Right office of positive PCR screening. Pt. Uses [facility] in [place], [place].

## 2013-01-14 MED ORDER — CHLORHEXIDINE GLUCONATE 4 % EX LIQD: 60.0000 mL | Freq: Once | CUTANEOUS | Status: DC

## 2013-01-14 MED ORDER — CEFAZOLIN SODIUM-DEXTROSE 2-3 GM-% IV SOLR
2.0000 g | INTRAVENOUS | Status: AC
  Administered 2013-01-15: 2 g via INTRAVENOUS
  Filled 2013-01-14: qty 50

## 2013-01-14 NOTE — Progress Notes (Signed)
Spoke with pt and informed him of arrival time change.  Pt to arrive at 0900 with stated understanding

## 2013-01-14 NOTE — Progress Notes (Signed)
Pt. Notified of time change, instructed to arrive at 10 am.

## 2013-01-15 ENCOUNTER — Encounter (HOSPITAL_COMMUNITY)
Admission: RE | Disposition: A | Discharge status: Home Health | Payer: Self-pay | Source: Ambulatory Visit | Attending: Orthopedic Surgery

## 2013-01-15 ENCOUNTER — Ambulatory Visit (HOSPITAL_COMMUNITY): Payer: BC Managed Care – PPO | Admitting: Certified Registered"

## 2013-01-15 ENCOUNTER — Encounter (HOSPITAL_COMMUNITY): Payer: Self-pay | Admitting: Certified Registered"

## 2013-01-15 ENCOUNTER — Inpatient Hospital Stay (HOSPITAL_COMMUNITY)
Admission: RE | Admit: 2013-01-15 | Discharge: 2013-01-17 | DRG: 209 | Disposition: A | Discharge status: Home Health | Payer: BC Managed Care – PPO | Source: Ambulatory Visit | Attending: Orthopedic Surgery | Admitting: Orthopedic Surgery

## 2013-01-15 DIAGNOSIS — E785 Hyperlipidemia, unspecified: Secondary | ICD-10-CM | POA: Diagnosis present

## 2013-01-15 DIAGNOSIS — Z96649 Presence of unspecified artificial hip joint: Secondary | ICD-10-CM

## 2013-01-15 DIAGNOSIS — M1712 Unilateral primary osteoarthritis, left knee: Secondary | ICD-10-CM | POA: Diagnosis present

## 2013-01-15 DIAGNOSIS — M1710 Unilateral primary osteoarthritis, unspecified knee: Principal | ICD-10-CM | POA: Diagnosis present

## 2013-01-15 DIAGNOSIS — T148XXA Other injury of unspecified body region, initial encounter: Secondary | ICD-10-CM

## 2013-01-15 HISTORY — PX: TOTAL KNEE ARTHROPLASTY: SHX125

## 2013-01-15 SURGERY — ARTHROPLASTY, KNEE, TOTAL
Anesthesia: General | Site: Knee | Laterality: Left | Wound class: Clean

## 2013-01-15 MED ORDER — ONDANSETRON HCL 4 MG/2ML IJ SOLN: 4.0000 mg | Freq: Four times a day (QID) | INTRAMUSCULAR | Status: DC | PRN

## 2013-01-15 MED ORDER — MORPHINE SULFATE 2 MG/ML IJ SOLN
1.0000 mg | INTRAMUSCULAR | Status: DC | PRN
  Administered 2013-01-15 (×3): 1 mg via INTRAVENOUS
  Filled 2013-01-15 (×2): qty 1

## 2013-01-15 MED ORDER — LACTATED RINGERS IV SOLN
INTRAVENOUS | Status: DC
  Administered 2013-01-15 (×2): via INTRAVENOUS

## 2013-01-15 MED ORDER — ACETAMINOPHEN 500 MG PO TABS
1000.0000 mg | ORAL_TABLET | Freq: Four times a day (QID) | ORAL | Status: AC
  Administered 2013-01-15 – 2013-01-16 (×4): 1000 mg via ORAL
  Filled 2013-01-15 (×4): qty 2

## 2013-01-15 MED ORDER — ATORVASTATIN CALCIUM 40 MG PO TABS
40.0000 mg | ORAL_TABLET | Freq: Every day | ORAL | Status: DC
  Administered 2013-01-15 – 2013-01-16 (×2): 40 mg via ORAL
  Filled 2013-01-15 (×3): qty 1

## 2013-01-15 MED ORDER — ACETAMINOPHEN 650 MG RE SUPP: 650.0000 mg | Freq: Four times a day (QID) | RECTAL | Status: DC | PRN

## 2013-01-15 MED ORDER — CEFAZOLIN SODIUM-DEXTROSE 2-3 GM-% IV SOLR
2.0000 g | Freq: Four times a day (QID) | INTRAVENOUS | Status: AC
  Administered 2013-01-15 (×2): 2 g via INTRAVENOUS
  Filled 2013-01-15 (×2): qty 50

## 2013-01-15 MED ORDER — LIDOCAINE HCL (CARDIAC) 20 MG/ML IV SOLN
INTRAVENOUS | Status: DC | PRN
  Administered 2013-01-15: 70 mg via INTRAVENOUS

## 2013-01-15 MED ORDER — MIDAZOLAM HCL 2 MG/2ML IJ SOLN
INTRAMUSCULAR | Status: AC
  Filled 2013-01-15: qty 2

## 2013-01-15 MED ORDER — OXYCODONE HCL 5 MG PO TABS
5.0000 mg | ORAL_TABLET | ORAL | Status: DC | PRN
  Administered 2013-01-15 – 2013-01-17 (×10): 10 mg via ORAL
  Filled 2013-01-15 (×9): qty 2

## 2013-01-15 MED ORDER — OXYCODONE HCL 5 MG PO TABS
ORAL_TABLET | ORAL | Status: AC
  Filled 2013-01-15: qty 2

## 2013-01-15 MED ORDER — ONDANSETRON HCL 4 MG/2ML IJ SOLN
INTRAMUSCULAR | Status: DC | PRN
  Administered 2013-01-15: 4 mg via INTRAVENOUS

## 2013-01-15 MED ORDER — ASPIRIN EC 81 MG PO TBEC
81.0000 mg | DELAYED_RELEASE_TABLET | Freq: Every day | ORAL | Status: DC
  Administered 2013-01-15 – 2013-01-16 (×2): 81 mg via ORAL
  Filled 2013-01-15 (×2): qty 1

## 2013-01-15 MED ORDER — ONDANSETRON HCL 4 MG PO TABS: 4.0000 mg | ORAL_TABLET | Freq: Four times a day (QID) | ORAL | Status: DC | PRN

## 2013-01-15 MED ORDER — ACETAMINOPHEN 325 MG PO TABS
650.0000 mg | ORAL_TABLET | Freq: Four times a day (QID) | ORAL | Status: DC | PRN
  Administered 2013-01-16: 650 mg via ORAL
  Filled 2013-01-15: qty 2

## 2013-01-15 MED ORDER — ALUM & MAG HYDROXIDE-SIMETH 200-200-20 MG/5ML PO SUSP
30.0000 mL | Freq: Four times a day (QID) | ORAL | Status: DC | PRN
  Administered 2013-01-15: 30 mL via ORAL
  Filled 2013-01-15: qty 30

## 2013-01-15 MED ORDER — MENTHOL 3 MG MT LOZG
1.0000 | LOZENGE | OROMUCOSAL | Status: DC | PRN
  Filled 2013-01-15: qty 9

## 2013-01-15 MED ORDER — FENTANYL CITRATE 0.05 MG/ML IJ SOLN
25.0000 ug | INTRAMUSCULAR | Status: DC | PRN
  Administered 2013-01-15 (×2): 50 ug via INTRAVENOUS

## 2013-01-15 MED ORDER — METOCLOPRAMIDE HCL 5 MG/ML IJ SOLN: 5.0000 mg | Freq: Three times a day (TID) | INTRAMUSCULAR | Status: DC | PRN

## 2013-01-15 MED ORDER — FENTANYL CITRATE 0.05 MG/ML IJ SOLN
INTRAMUSCULAR | Status: AC
  Filled 2013-01-15: qty 2

## 2013-01-15 MED ORDER — METOCLOPRAMIDE HCL 10 MG PO TABS: 5.0000 mg | ORAL_TABLET | Freq: Three times a day (TID) | ORAL | Status: DC | PRN

## 2013-01-15 MED ORDER — SUFENTANIL CITRATE 50 MCG/ML IV SOLN
INTRAVENOUS | Status: DC | PRN
  Administered 2013-01-15: 10 ug via INTRAVENOUS
  Administered 2013-01-15: 20 ug via INTRAVENOUS
  Administered 2013-01-15: 5 ug via INTRAVENOUS
  Administered 2013-01-15: 10 ug via INTRAVENOUS
  Administered 2013-01-15: 5 ug via INTRAVENOUS

## 2013-01-15 MED ORDER — DOCUSATE SODIUM 100 MG PO CAPS
100.0000 mg | ORAL_CAPSULE | Freq: Two times a day (BID) | ORAL | Status: DC
  Administered 2013-01-15 – 2013-01-17 (×4): 100 mg via ORAL
  Filled 2013-01-15 (×6): qty 1

## 2013-01-15 MED ORDER — SODIUM CHLORIDE 0.9 % IJ SOLN
INTRAMUSCULAR | Status: DC | PRN
  Administered 2013-01-15: 13:00:00

## 2013-01-15 MED ORDER — LACTATED RINGERS IV SOLN
INTRAVENOUS | Status: DC
  Administered 2013-01-15 – 2013-01-16 (×3): via INTRAVENOUS

## 2013-01-15 MED ORDER — PHENOL 1.4 % MT LIQD: 1.0000 | OROMUCOSAL | Status: DC | PRN

## 2013-01-15 MED ORDER — FENTANYL CITRATE 0.05 MG/ML IJ SOLN: 50.0000 ug | INTRAMUSCULAR | Status: DC | PRN

## 2013-01-15 MED ORDER — FENTANYL CITRATE 0.05 MG/ML IJ SOLN
25.0000 ug | INTRAMUSCULAR | Status: DC | PRN
  Administered 2013-01-15: 25 ug via INTRAVENOUS
  Administered 2013-01-15: 50 ug via INTRAVENOUS
  Administered 2013-01-15: 25 ug via INTRAVENOUS
  Administered 2013-01-15: 50 ug via INTRAVENOUS

## 2013-01-15 MED ORDER — MORPHINE SULFATE 2 MG/ML IJ SOLN
INTRAMUSCULAR | Status: AC
  Filled 2013-01-15: qty 1

## 2013-01-15 MED ORDER — MIDAZOLAM HCL 5 MG/5ML IJ SOLN
INTRAMUSCULAR | Status: DC | PRN
  Administered 2013-01-15: 2 mg via INTRAVENOUS

## 2013-01-15 MED ORDER — ROCURONIUM BROMIDE 100 MG/10ML IV SOLN
INTRAVENOUS | Status: DC | PRN
  Administered 2013-01-15: 50 mg via INTRAVENOUS

## 2013-01-15 MED ORDER — LACTATED RINGERS IV SOLN: INTRAVENOUS | Status: DC

## 2013-01-15 MED ORDER — OXYCODONE HCL 5 MG/5ML PO SOLN: 5.0000 mg | Freq: Once | ORAL | Status: DC | PRN

## 2013-01-15 MED ORDER — SODIUM CHLORIDE 0.9 % IR SOLN
Status: DC | PRN
  Administered 2013-01-15: 1000 mL

## 2013-01-15 MED ORDER — BUPIVACAINE-EPINEPHRINE PF 0.5-1:200000 % IJ SOLN
INTRAMUSCULAR | Status: DC | PRN
  Administered 2013-01-15: 10 mL

## 2013-01-15 MED ORDER — PROPOFOL 10 MG/ML IV BOLUS
INTRAVENOUS | Status: DC | PRN
  Administered 2013-01-15: 200 mg via INTRAVENOUS

## 2013-01-15 MED ORDER — BUPIVACAINE-EPINEPHRINE PF 0.5-1:200000 % IJ SOLN
INTRAMUSCULAR | Status: AC
  Filled 2013-01-15: qty 30

## 2013-01-15 MED ORDER — BUPIVACAINE LIPOSOME 1.3 % IJ SUSP
20.0000 mL | INTRAMUSCULAR | Status: DC
  Filled 2013-01-15: qty 20

## 2013-01-15 MED ORDER — OXYCODONE HCL 5 MG PO TABS: 5.0000 mg | ORAL_TABLET | Freq: Once | ORAL | Status: DC | PRN

## 2013-01-15 SURGICAL SUPPLY — 53 items
BANDAGE ESMARK 6X9 LF (GAUZE/BANDAGES/DRESSINGS) ×1 IMPLANT
BLADE SAG 18X100X1.27 (BLADE) ×4 IMPLANT
BNDG ESMARK 6X9 LF (GAUZE/BANDAGES/DRESSINGS) ×2
BOWL SMART MIX CTS (DISPOSABLE) ×2 IMPLANT
CEMENT BONE SIMPLEX SPEEDSET (Cement) ×4 IMPLANT
CLOTH BEACON ORANGE TIMEOUT ST (SAFETY) ×2 IMPLANT
COVER SURGICAL LIGHT HANDLE (MISCELLANEOUS) ×2 IMPLANT
CUFF TOURNIQUET SINGLE 34IN LL (TOURNIQUET CUFF) ×2 IMPLANT
DRAPE EXTREMITY T 121X128X90 (DRAPE) ×2 IMPLANT
DRAPE PROXIMA HALF (DRAPES) ×2 IMPLANT
DRAPE U-SHAPE 47X51 STRL (DRAPES) ×2 IMPLANT
DRSG PAD ABDOMINAL 8X10 ST (GAUZE/BANDAGES/DRESSINGS) ×2 IMPLANT
DURAPREP 26ML APPLICATOR (WOUND CARE) ×2 IMPLANT
ELECT CAUTERY BLADE 6.4 (BLADE) ×2 IMPLANT
ELECT REM PT RETURN 9FT ADLT (ELECTROSURGICAL) ×2
ELECTRODE REM PT RTRN 9FT ADLT (ELECTROSURGICAL) ×1 IMPLANT
EVACUATOR 1/8 PVC DRAIN (DRAIN) ×2 IMPLANT
FACESHIELD LNG OPTICON STERILE (SAFETY) ×2 IMPLANT
GAUZE XEROFORM 5X9 LF (GAUZE/BANDAGES/DRESSINGS) ×2 IMPLANT
GLOVE ORTHO TXT STRL SZ7.5 (GLOVE) ×2 IMPLANT
GOWN PREVENTION PLUS XLARGE (GOWN DISPOSABLE) ×4 IMPLANT
GOWN STRL NON-REIN LRG LVL3 (GOWN DISPOSABLE) ×4 IMPLANT
HANDPIECE INTERPULSE COAX TIP (DISPOSABLE) ×1
IMMOBILIZER KNEE 22 UNIV (SOFTGOODS) ×2 IMPLANT
IMMOBILIZER KNEE 24 THIGH 36 (MISCELLANEOUS) IMPLANT
IMMOBILIZER KNEE 24 UNIV (MISCELLANEOUS)
KIT BASIN OR (CUSTOM PROCEDURE TRAY) ×2 IMPLANT
KIT ROOM TURNOVER OR (KITS) ×2 IMPLANT
KNEE/VIT E POLY LINER LEVEL 1B ×2 IMPLANT
MANIFOLD NEPTUNE II (INSTRUMENTS) ×2 IMPLANT
NEEDLE 18GX1X1/2 (RX/OR ONLY) (NEEDLE) ×2 IMPLANT
NEEDLE HYPO 25GX1X1/2 BEV (NEEDLE) ×2 IMPLANT
NS IRRIG 1000ML POUR BTL (IV SOLUTION) ×2 IMPLANT
PACK TOTAL JOINT (CUSTOM PROCEDURE TRAY) ×2 IMPLANT
PAD ARMBOARD 7.5X6 YLW CONV (MISCELLANEOUS) ×4 IMPLANT
PAD CAST 4YDX4 CTTN HI CHSV (CAST SUPPLIES) ×1 IMPLANT
PADDING CAST ABS 4INX4YD NS (CAST SUPPLIES) ×1
PADDING CAST ABS COTTON 4X4 ST (CAST SUPPLIES) ×1 IMPLANT
PADDING CAST COTTON 4X4 STRL (CAST SUPPLIES) ×1
PADDING CAST COTTON 6X4 STRL (CAST SUPPLIES) ×2 IMPLANT
SET HNDPC FAN SPRY TIP SCT (DISPOSABLE) ×1 IMPLANT
SPONGE GAUZE 4X4 12PLY (GAUZE/BANDAGES/DRESSINGS) ×2 IMPLANT
STAPLER VISISTAT 35W (STAPLE) ×2 IMPLANT
SUCTION FRAZIER TIP 10 FR DISP (SUCTIONS) ×2 IMPLANT
SUT VIC AB 1 CTX 36 (SUTURE) ×2
SUT VIC AB 1 CTX36XBRD ANBCTR (SUTURE) ×2 IMPLANT
SUT VIC AB 2-0 CT1 27 (SUTURE) ×2
SUT VIC AB 2-0 CT1 TAPERPNT 27 (SUTURE) ×2 IMPLANT
SYR CONTROL 10ML LL (SYRINGE) ×2 IMPLANT
TOWEL OR 17X24 6PK STRL BLUE (TOWEL DISPOSABLE) ×2 IMPLANT
TOWEL OR 17X26 10 PK STRL BLUE (TOWEL DISPOSABLE) ×2 IMPLANT
TRAY FOLEY CATH 16FRSI W/METER (SET/KITS/TRAYS/PACK) ×2 IMPLANT
WATER STERILE IRR 1000ML POUR (IV SOLUTION) ×4 IMPLANT

## 2013-01-15 NOTE — Plan of Care (Signed)
Problem: Consults Goal: Diagnosis- Total Joint Replacement Primary Total Knee LEft     

## 2013-01-15 NOTE — Op Note (Signed)
NAMEEDRIC, FETTERMAN NO.:  [id number]  MEDICAL RECORD NO.:  [id number]  LOCATION:  5N29C                        FACILITY:  [facility]  PHYSICIAN:  Loreta Ave, M.D. DATE OF BIRTH:  August 02, 1950  DATE OF PROCEDURE:  01/15/2013 DATE OF DISCHARGE:                              OPERATIVE REPORT   PREOPERATIVE DIAGNOSIS:  Left knee end-stage degenerative arthritis, varus alignment, mild flexion contracture.  POSTOPERATIVE DIAGNOSIS:  Left knee end-stage degenerative arthritis, varus alignment, mild flexion contracture.  PROCEDURE:  Modified minimally invasive left total knee replacement, Stryker Triathlon prosthesis.  Soft tissue balancing.  Cemented pegged posterior stabilized #5 femoral component.  Cemented #6 tibial component, 9-mm polyethylene insert.  Cemented resurfacing 35-mm patellar component.  SURGEON:  Loreta Ave, MD  ASSISTANT:  Margarita Rana, MD, present throughout the entire case and necessary for timely completion of procedure.  ANESTHESIA:  General.  ESTIMATED BLOOD LOSS:  Minimal.  SPECIMENS:  None.  CULTURES:  None.  COMPLICATIONS:  None.  DRESSINGS:  Soft compressive knee immobilizer.  DRAINS:  Hemovac x1.  TOURNIQUET TIME:  One hour.  DESCRIPTION OF PROCEDURE:  The patient was brought to the operating room, placed on the operating table in supine position.  After adequate anesthesia had been obtained, tourniquet applied.  Prepped and draped in usual sterile fashion.  Exsanguinated with elevation of Esmarch. Tourniquet inflated to 350 mmHg.  Straight incision above the patella down the tibial tubercle.  Hemostasis with cautery.  Medial arthrotomy, vastus splitting, preserving quad tendon.  Medial capsule release.  Knee exposed.  Grade 4 change throughout.  Remnants of menisci, loose bodies, periarticular spurs removed.  Intramedullary guide distal femur.  10-mm cut 5 degrees of valgus.  Using epicondylar axis, the femur was  sized, cut, and fitted for a posterior stabilized pegged #5 component. Proximal tibial resection extramedullary guide.  A 3-degree posterior slope cut.  Size #6 component.  Patella exposed.  Posterior 10 mm removed.  Drilled, sized, and fitted for a 35-mm component.  Debris cleared throughout.  All spurs removed.  Trials put in place.  Rotation of the tibia set with trials.  The tibia was then reamed.  Copious irrigation with pulse irrigating device.  Cement prepared, placed on all components.  Firmly seated.  Polyethylene attached to tibia.  Knee reduced.  Patella with a clamp.  Once cement hardened, the knee was examined.  Very pleased of biomechanical axis, balance in flexion and extension, full motion, good patellar tracking.  Hemovac was placed and brought through a separate stab wound.  Wound irrigated once again. Soft tissues injected with Exparel.  Arthrotomy closed with #1 Vicryl. Skin and subcutaneous tissue with Vicryl and staples.  Margins were injected with Marcaine.  Sterile compressive dressing applied. Tourniquet deflated and removed.  Knee immobilizer applied.  Anesthesia reversed.  Brought to the recovery room.  Tolerated the surgery well. No complications.     Loreta Ave, M.D.     DFM/MEDQ  D:  01/15/2013  T:  01/15/2013  Job:  [id number]

## 2013-01-15 NOTE — Preoperative (Signed)
Beta Blockers   Reason not to administer Beta Blockers:Not Applicable 

## 2013-01-15 NOTE — Transfer of Care (Signed)
Immediate Anesthesia Transfer of Care Note  Patient: Robert [name]  Procedure(s) Performed: Procedure(s): TOTAL KNEE ARTHROPLASTY- left  (Left)  Patient Location: PACU  Anesthesia Type:General  Level of Consciousness: awake, alert  and oriented  Airway & Oxygen Therapy: Patient Spontanous Breathing and Patient connected to nasal cannula oxygen  Post-op Assessment: Report given to PACU RN, Post -op Vital signs reviewed and stable and Patient moving all extremities  Post vital signs: Reviewed and stable  Complications: No apparent anesthesia complications

## 2013-01-15 NOTE — Anesthesia Preprocedure Evaluation (Addendum)
Anesthesia Evaluation  Patient identified by MRN, date of birth, ID band Patient awake    Reviewed: Allergy & Precautions, H&P , NPO status , Patient's Chart, lab work & pertinent test results  Airway Mallampati: II TM Distance: >3 FB Neck ROM: full    Dental  (+) Dental Advisory Given   Pulmonary asthma ,          Cardiovascular     Neuro/Psych  Neuromuscular disease    GI/Hepatic   Endo/Other    Renal/GU      Musculoskeletal  (+) Arthritis -,   Abdominal   Peds  Hematology   Anesthesia Other Findings   Reproductive/Obstetrics                          Anesthesia Physical Anesthesia Plan  ASA: II  Anesthesia Plan: General   Post-op Pain Management:    Induction: Intravenous  Airway Management Planned: Oral ETT  Additional Equipment:   Intra-op Plan:   Post-operative Plan: Extubation in OR  Informed Consent: I have reviewed the patients History and Physical, chart, labs and discussed the procedure including the risks, benefits and alternatives for the proposed anesthesia with the patient or authorized representative who has indicated his/her understanding and acceptance.   Dental advisory given  Plan Discussed with: CRNA, Anesthesiologist and Surgeon  Anesthesia Plan Comments:        Anesthesia Quick Evaluation

## 2013-01-15 NOTE — Progress Notes (Signed)
Orthopedic Tech Progress Note Patient Details:  Robert [name] [birth year] [id number]  CPM Left Knee CPM Left Knee: On Left Knee Flexion (Degrees): 60 Left Knee Extension (Degrees): 0   Shawnie Pons 01/15/2013, 5:35 PM

## 2013-01-15 NOTE — H&P (Signed)
[facility] [address]   [address] [place], [place] [address] [phone] A Division of [facility]  Loreta Ave, M.D.   Robert A. Thurston Hole, M.D.   Burnell Blanks, M.D.   Eulas Post, M.D.   Lunette Stands, M.D Jewel Baize. Eulah Pont, M.D.  Buford Dresser, M.D.  Charlsie Quest, M.D.  Estell Harpin, M.D.   Melina Fiddler, M.D. Kirstin A. Shepperson, PA-C  Josh [place], PA-C [place], [place]   RE: [place], [place]   [id number]      DOB: 1951-03-14 PROGRESS NOTE: 01-03-13 Mr. Yaffe is a 62 year old here to discuss  total knee replacement. He has had bilateral total hip replacements and knee arthroscopy. He has had steroid injections anti-inflammatories and rest without relief. He wishes to proceed with left total hip arthroplasty which is scheduled next month. Current medications: Lipitor, Niaspan and baby aspirin. Surgical history significant for bilateral total hip replacements appendectomy nasal surgery and left knee arthroscopy, revision left total hip and surgery for hemorrhoids. Drug allergy to sulfa. He has taken Celebrex without difficulty. He does not use tobacco he drinks once a week and lives with his wife. Family history is positive for heart disease CVA and cancer. Review of systems: positive for pneumonia as a child, asthma and hemorrhoids.  She denies any light headedness dizziness fevers chills cardiac pulmonary GI GU or neuro issues.  EXAMINATION: He is 5'10" 203 pounds. Blood pressure is 134/85 pulse is 67. Temp is 97.74. Alert and oriented x3. No significant distress. Head is normal cephalic atraumatic. PERRLA. Extraocular motion is intact. Lungs clear to auscultation bilaterally no wheezes. Heart regular rate and rhythm without murmur rubs or gallops. Abdomen is soft non-distended no palpable masses. He has a mild antalgic gait. He has well healed scars of both hips with good range of motion. His  left knee has range of motion 0-100 degrees ligamentously stable positive crepitus throughout range of motion trace effusion.   X-RAYS: Show tri-compartmental bone on bone degenerative changes.   IMPRESSION: End stage degenerative joint disease left knee.  PLAN: He wishes to proceed with total knee arthroplasty as scheduled. Discussed risks benefits and possible complications in detail all questions answered. We will proceed as scheduled. His given samples of Celebrex.  Loreta Ave, M.D.  Electronically verified by Loreta Ave, M.D. DFM(BR):kah D 01-06-13 T 01-06-13

## 2013-01-15 NOTE — Interval H&P Note (Signed)
History and Physical Interval Note:  01/15/2013 8:28 AM  Robert [name]  has presented today for surgery, with the diagnosis of DJD LEFT KNEE  The various methods of treatment have been discussed with the patient and family. After consideration of risks, benefits and other options for treatment, the patient has consented to  Procedure(s): TOTAL KNEE ARTHROPLASTY (Left) as a surgical intervention .  The patient's history has been reviewed, patient examined, no change in status, stable for surgery.  I have reviewed the patient's chart and labs.  Questions were answered to the patient's satisfaction.     [name]

## 2013-01-16 ENCOUNTER — Inpatient Hospital Stay (HOSPITAL_COMMUNITY): Payer: BC Managed Care – PPO

## 2013-01-16 LAB — CBC
MCH: 28.2 pg (ref 26.0–34.0)
MCHC: 34.5 g/dL (ref 30.0–36.0)
MCV: 81.9 fL (ref 78.0–100.0)
Platelets: 212 10*3/uL (ref 150–400)
RBC: 3.86 MIL/uL — ABNORMAL LOW (ref 4.22–5.81)

## 2013-01-16 MED ORDER — ENOXAPARIN SODIUM 30 MG/0.3ML ~~LOC~~ SOLN
30.0000 mg | Freq: Two times a day (BID) | SUBCUTANEOUS | Status: DC
  Administered 2013-01-16 – 2013-01-17 (×3): 30 mg via SUBCUTANEOUS
  Filled 2013-01-16 (×4): qty 0.3

## 2013-01-16 MED ORDER — WARFARIN - PHARMACIST DOSING INPATIENT
Freq: Every day | Status: DC
  Administered 2013-01-16: 1

## 2013-01-16 MED ORDER — METHOCARBAMOL 500 MG PO TABS
500.0000 mg | ORAL_TABLET | Freq: Four times a day (QID) | ORAL | Status: DC | PRN
  Administered 2013-01-16 – 2013-01-17 (×4): 1000 mg via ORAL
  Filled 2013-01-16 (×4): qty 2

## 2013-01-16 MED ORDER — METHOCARBAMOL 100 MG/ML IJ SOLN: 500.0000 mg | Freq: Four times a day (QID) | INTRAVENOUS | Status: DC | PRN

## 2013-01-16 MED ORDER — WARFARIN SODIUM 7.5 MG PO TABS
7.5000 mg | ORAL_TABLET | Freq: Once | ORAL | Status: AC
  Administered 2013-01-16: 7.5 mg via ORAL
  Filled 2013-01-16: qty 1

## 2013-01-16 MED ORDER — MORPHINE SULFATE 2 MG/ML IJ SOLN
1.0000 mg | INTRAMUSCULAR | Status: DC | PRN
  Administered 2013-01-16 (×3): 2 mg via INTRAVENOUS
  Filled 2013-01-16 (×3): qty 1

## 2013-01-16 NOTE — Progress Notes (Signed)
Pt c/o 7-10 out of 10 pain constantly, with pain meds. Dangled pt on edge of bed at 0030 for approximately 10 min. Pt tolerated well, stated pain just slightly better after moving but still constant. Orders received for increased MS IV and robaxin ordered. Will continue to monitor.

## 2013-01-16 NOTE — Progress Notes (Signed)
Subjective: 1 Day Post-Op Procedure(s) (LRB): TOTAL KNEE ARTHROPLASTY- left  (Left) Patient reports pain as 3 on 0-10 scale Voiding without difficulty.    Objective: Vital signs in last 24 hours: Temp:  [98 F (36.7 C)-98.8 F (37.1 C)] 98.4 F (36.9 C) (08/07 0605) Pulse Rate:  [66-81] 79 (08/07 0605) Resp:  [10-22] 18 (08/07 0800) BP: (116-149)/(65-93) 116/65 mmHg (08/07 0605) SpO2:  [94 %-100 %] 98 % (08/07 0605)  Intake/Output from previous day: 08/06 0701 - 08/07 0700 In: 1980 [P.O.:480; I.V.:1500] Out: 2125 [Urine:1375; Drains:700; Blood:50] Intake/Output this shift: Total I/O In: -  Out: 700 [Urine:700]   Recent Labs  01/16/13 0500  HGB 10.9*    Recent Labs  01/16/13 0500  WBC 7.3  RBC 3.86*  HCT 31.6*  PLT 212   No results found for this basename: NA, K, CL, CO2, BUN, CREATININE, GLUCOSE, CALCIUM,  in the last 72 hours No results found for this basename: LABPT, INR,  in the last 72 hours  Neurologically intact Intact pulses distally Dorsiflexion/Plantar flexion intact Compartment soft  Assessment/Plan: 1 Day Post-Op Procedure(s) (LRB): TOTAL KNEE ARTHROPLASTY- left  (Left) Advance diet Up with therapy D/C IV fluids Discharge home with home health tomorrow if PT progresses  Lavena Loretto B 01/16/2013, 12:25 PM

## 2013-01-16 NOTE — Evaluation (Signed)
Physical Therapy Evaluation Patient Details Name: Robert [name] MRN: [id number] DOB: Oct 05, 1950 Today's Date: 01/16/2013 Time: 4098-1191 PT Time Calculation (min): 30 min  PT Assessment / Plan / Recommendation History of Present Illness  Pt. 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  Pt. with h/o THAs bilaterally.  This patient underwent a left TKA and presents to PT with anticipated post-op decrease in strength and ROM, decreased functional mobility and gait.  Pt. Will benefit from acute PT to address these and below issues.  Expect him to make rapid progress.      PT Assessment  Patient needs continued PT services    Follow Up Recommendations  Home health PT;Supervision/Assistance - 24 hour;Supervision for mobility/OOB    Does the patient have the potential to tolerate intense rehabilitation      Barriers to Discharge        Equipment Recommendations  None recommended by PT    Recommendations for Other Services     Frequency 7X/week    Precautions / Restrictions Precautions Precautions: Knee Precaution Booklet Issued: Yes (comment) Precaution Comments: pt. educated on no pillow under left knee and on quad sets and ankle pumps.   Required Braces or Orthoses: Knee Immobilizer - Left Knee Immobilizer - Left: On when out of bed or walking Restrictions Weight Bearing Restrictions: Yes LLE Weight Bearing: Weight bearing as tolerated   Pertinent Vitals/Pain See vitals tab       Mobility  Bed Mobility Bed Mobility: Supine to Sit;Sitting - Scoot to Edge of Bed Supine to Sit: 4: Min guard;HOB flat Sitting - Scoot to [place] of Bed: 6: Modified independent (Device/Increase time) Details for Bed Mobility Assistance: cues for technique , min guard for safety Transfers Transfers: Sit to Stand;Stand to Sit Sit to Stand: 4: Min assist;From bed;With upper extremity assist Stand to Sit: 4: Min guard;With upper extremity assist;With armrests;To chair/3-in-1 Details  for Transfer Assistance: cues for hand placement and L LE positioning  for comfort in transition.  Min  assist to rise, min guard to sit Ambulation/Gait Ambulation/Gait Assistance: 4: Min assist (sceond person for equipment management) Ambulation Distance (Feet): 70 Feet Assistive device: Rolling walker Ambulation/Gait Assistance Details: cues for sequencing and correct step length and RW placement Gait Pattern: Step-to pattern;Decreased hip/knee flexion - right Stairs: No    Exercises Total Joint Exercises Ankle Circles/Pumps: AROM;Both;20 reps Quad Sets: AROM;Both;10 reps Short Arc QuadBarbaraann Boys;Left;10 reps;Supine   PT Diagnosis: Difficulty walking;Generalized weakness  PT Problem List: Decreased strength;Decreased range of motion;Decreased activity tolerance;Decreased mobility;Decreased knowledge of use of DME;Decreased knowledge of precautions;Pain PT Treatment Interventions: DME instruction;Gait training;Stair training;Functional mobility training;Therapeutic activities;Therapeutic exercise;Patient/family education     PT Goals(Current goals can be found in the care plan section) Acute Rehab PT Goals 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  Pt. with h/o THAs bilaterally.       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, advertising Arousal/Alertness: Awake/alert Behavior During Therapy: WFL for tasks assessed/performed Overall Cognitive Status: Within Functional Limits for tasks assessed    Extremity/Trunk Assessment Upper Extremity Assessment Upper Extremity Assessment: Overall WFL for tasks assessed Lower Extremity Assessment Lower Extremity Assessment: LLE deficits/detail LLE Deficits / Details: good ankle pump and quad set Cervical / Trunk Assessment Cervical / Trunk Assessment: Normal   Balance    End of Session PT - End of Session Equipment Utilized During Treatment: Gait belt;Left knee immobilizer Activity Tolerance: Patient tolerated treatment well Patient left: in chair;with call bell/phone within reach Nurse Communication: Mobility status  GP     Ferman Hamming 01/16/2013, 12:06 PM Weldon Picking PT Acute Rehab Services [phone] Beeper [phone]

## 2013-01-16 NOTE — Anesthesia Postprocedure Evaluation (Signed)
  Anesthesia Post-op Note  Patient: Robert [name]  Procedure(s) Performed: Procedure(s): TOTAL KNEE ARTHROPLASTY- left  (Left)  Patient Location: PACU  Anesthesia Type:General  Level of Consciousness: awake, alert , oriented and patient cooperative  Airway and Oxygen Therapy: Patient Spontanous Breathing and Patient connected to nasal cannula oxygen  Post-op Pain: mild  Post-op Assessment: Post-op Vital signs reviewed, Patient's Cardiovascular Status Stable, Respiratory Function Stable, Patent Airway, No signs of Nausea or vomiting and Pain level controlled  Post-op Vital Signs: Reviewed and stable  Complications: No apparent anesthesia complications

## 2013-01-16 NOTE — Progress Notes (Signed)
ANTICOAGULATION CONSULT NOTE - Initial Consult  Pharmacy Consult for Coumadin Indication: VTE prophylaxis  Allergies  Allergen Reactions  . Sulfa Antibiotics     Childhood allergy    Patient Measurements:   Heparin Dosing Weight:   Vital Signs: Temp: 98.4 F (36.9 C) (08/07 0605) BP: 116/65 mmHg (08/07 0605) Pulse Rate: 79 (08/07 0605)  Labs:  Recent Labs  01/16/13 0500  HGB 10.9*  HCT 31.6*  PLT 212    The CrCl is unknown because both a height and weight (above a minimum accepted value) are required for this calculation.   Medical History: Past Medical History  Diagnosis Date  . History of blood transfusion     autologus  . H/O cardiovascular stress test     results good, [facility], done as a baseline  Sept. 2013  . Asthma     rare use of ventolin inhaler, last time a month  or more ago   . Pneumonia     hosp. as a child   . Arthritis     heredity - hip dysplasia, OA  . Hyperlipidemia   . Complication of anesthesia     shivering, hypothermia after THA '93 with GA    Medications:  Scheduled:  . acetaminophen  1,000 mg Oral Q6H  . atorvastatin  40 mg Oral QHS  . docusate sodium  100 mg Oral BID  . enoxaparin (LOVENOX) injection  30 mg Subcutaneous Q12H    Assessment: 62yo male s/p L-TKA, on Coumadin for VTE px.  Preop INR 1.01.  Plan for d/c home tomorrow if all goes well with PT.  Goal of Therapy:  INR 2-3 Monitor platelets by anticoagulation protocol: Yes   Plan:  1.  Coumadin 7.5mg  x 1 2.  Daily INR  Marisue Humble, PharmD Clinical Pharmacist [facility]

## 2013-01-16 NOTE — Progress Notes (Addendum)
Physical Therapy Treatment Patient Details Name: ANA LIAW MRN: [id number] DOB: 04-Sep-1950 Today's Date: 01/16/2013 Time: 4098-1191 PT Time Calculation (min): 23 min  PT Assessment / Plan / Recommendation  History of Present Illness Pt. 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  Pt. with h/o THAs bilaterally.   PT Comments   Pt. progressing well with mobility and ROM.  He needed re-education on not using pillow under knee as he was found with bed pillow under knee upon PT entry. Pt.states he will comply with this request and daughter present and reinforcing this with pt.   Follow Up Recommendations  Home health PT;Supervision/Assistance - 24 hour;Supervision for mobility/OOB     Does the patient have the potential to tolerate intense rehabilitation     Barriers to Discharge        Equipment Recommendations  None recommended by PT    Recommendations for Other Services    Frequency 7X/week   Progress towards PT Goals Progress towards PT goals: Progressing toward goals  Plan Current plan remains appropriate    Precautions / Restrictions Precautions Precautions: Knee Precaution Booklet Issued: Yes (comment) Precaution Comments: pt. educated on no pillow under left knee and on quad sets and ankle pumps.  .  Pt. with pillow under knee upon PT entry into room.  Pt. re-educated on reason for not doing this and he says he will comply. Required Braces or Orthoses: Knee Immobilizer - Left Knee Immobilizer - Left: On when out of bed or walking Restrictions Weight Bearing Restrictions: Yes LLE Weight Bearing: Weight bearing as tolerated   Pertinent Vitals/Pain See vitals tab     Mobility  Bed Mobility Bed Mobility: Supine to Sit;Sitting - Scoot to Edge of Bed Supine to Sit: 5: Supervision;HOB flat Sitting - Scoot to Edge of Bed: 6: Modified independent (Device/Increase time) Sit to Supine: 4: Min assist Details for Bed Mobility Assistance: Pt. managing upper body  well, needed min assist for L LE to get back into bed Transfers Transfers: Sit to Stand;Stand to Sit Sit to Stand: 4: Min guard;From bed;With upper extremity assist Stand to Sit: 4: Min guard;With upper extremity assist;To bed Details for Transfer Assistance: min guard assist for safety Ambulation/Gait Ambulation/Gait Assistance: 4: Min guard Ambulation Distance (Feet): 100 Feet Assistive device: Rolling walker Ambulation/Gait Assistance Details: needed several cues to keep RW further ahead of him; min guard for safety Gait Pattern: Step-to pattern;Decreased hip/knee flexion - right Stairs: No    Exercises Total Joint Exercises Ankle Circles/Pumps: AROM;Both;20 reps Knee Flexion: AAROM;Left;10 reps;Seated Goniometric ROM: 0-55   PT Diagnosis: Difficulty walking;Generalized weakness  PT Problem List: Decreased strength;Decreased range of motion;Decreased activity tolerance;Decreased mobility;Decreased knowledge of use of DME;Decreased knowledge of precautions;Pain PT Treatment Interventions: DME instruction;Gait training;Stair training;Functional mobility training;Therapeutic activities;Therapeutic exercise;Patient/family education   PT Goals (current goals can now be found in the care plan section) Acute Rehab PT Goals 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  Pt. with h/o THAs bilaterally.    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  Cognition Arousal/Alertness: Awake/alert Behavior During Therapy: [facility] for tasks assessed/performed  Overall Cognitive Status: Within Functional Limits for tasks assessed     Balance     End of Session PT - End of Session Equipment Utilized During Treatment: Gait belt;Left knee immobilizer Activity Tolerance: Patient tolerated treatment well Patient left: in bed;with call bell/phone within reach;with family/visitor present Nurse Communication: Mobility status;Patient requests pain meds   GP     Ferman Hamming 01/16/2013, 3:12 PM Weldon Picking PT Acute Rehab Services [phone] Beeper [phone]

## 2013-01-16 NOTE — Progress Notes (Signed)
UR COMPLETED  

## 2013-01-17 DIAGNOSIS — M1712 Unilateral primary osteoarthritis, left knee: Secondary | ICD-10-CM | POA: Diagnosis present

## 2013-01-17 MED ORDER — WARFARIN SODIUM 5 MG PO TABS: 5.0000 mg | ORAL_TABLET | Freq: Every day | ORAL | Status: DC

## 2013-01-17 MED ORDER — METHOCARBAMOL 500 MG PO TABS: 500.0000 mg | ORAL_TABLET | Freq: Four times a day (QID) | ORAL | Status: DC | PRN

## 2013-01-17 MED ORDER — OXYCODONE-ACETAMINOPHEN 5-325 MG PO TABS: 1.0000 | ORAL_TABLET | ORAL | Status: DC | PRN

## 2013-01-17 MED ORDER — ENOXAPARIN SODIUM 30 MG/0.3ML ~~LOC~~ SOLN: 30.0000 mg | Freq: Two times a day (BID) | SUBCUTANEOUS | Status: DC

## 2013-01-17 MED ORDER — DSS 100 MG PO CAPS: 100.0000 mg | ORAL_CAPSULE | Freq: Two times a day (BID) | ORAL | Status: DC

## 2013-01-17 NOTE — Progress Notes (Signed)
RN reviewed discharge instructions with patient and wife. All questions answered. Prescriptions given. Pateint said he felt like he had a fever, temperature was taken, temp was 98.9 orally.   Patient wheeled down to wife's car by NT.

## 2013-01-17 NOTE — Progress Notes (Signed)
Physical Therapy Treatment Patient Details Name: ELDWIN VOLKOV MRN: [id number] DOB: 02-22-1951 Today's Date: 01/17/2013 Time: 4098-1191 PT Time Calculation (min): 24 min  PT Assessment / Plan / Recommendation  History of Present Illness Pt. 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  Pt. with h/o THAs bilaterally.   PT Comments   Pt. Is slightly impulsive but overall appeared safe today in his mobility.  He states PA told to "be a turtle, not a rabbit" right now and he seemed to modify his impulsivity with this cueing.  He slowed his pace with this cueing.  Follow Up Recommendations  Home health PT;Supervision/Assistance - 24 hour;Supervision for mobility/OOB     Does the patient have the potential to tolerate intense rehabilitation     Barriers to Discharge        Equipment Recommendations  None recommended by PT    Recommendations for Other Services    Frequency 7X/week   Progress towards PT Goals Progress towards PT goals: Progressing toward goals  Plan Current plan remains appropriate    Precautions / Restrictions Precautions Precautions: Knee Precaution Booklet Issued: Yes (comment) Precaution Comments: pt. educated on no pillow under left knee and on quad sets and ankle pumps.  .  Pt. with pillow under knee upon PT entry into room.  Pt. re-educated on reason for not doing this and he says he will comply. Required Braces or Orthoses: Knee Immobilizer - Left Knee Immobilizer - Left: On when out of bed or walking Restrictions Weight Bearing Restrictions: Yes LLE Weight Bearing: Weight bearing as tolerated   Pertinent Vitals/Pain See vitals tab     Mobility  Bed Mobility Bed Mobility: Supine to Sit;Sitting - Scoot to Edge of Bed Supine to Sit: 6: Modified independent (Device/Increase time);HOB flat Sitting - Scoot to Edge of Bed: 6: Modified independent (Device/Increase time) Sit to Supine: Not Tested (comment) Details for Bed Mobility Assistance: Pt.  uses knee immobilizer to move his left knee to edge of bed.  He tends to try to use trapeze when left to his own methods, but can complete mobility without use of trapeze Transfers Transfers: Sit to Stand;Stand to Sit Sit to Stand: 6: Modified independent (Device/Increase time);From bed;With upper extremity assist Stand to Sit: 6: Modified independent (Device/Increase time);To chair/3-in-1;With armrests Details for Transfer Assistance: cues for correct and safest hand placment Ambulation/Gait Ambulation/Gait Assistance: 6: Modified independent (Device/Increase time) Ambulation Distance (Feet): 200 Feet Assistive device: Rolling walker Ambulation/Gait Assistance Details: good pace and sequencing; only one brief standing rest break needed Gait Pattern: Step-to pattern;Decreased hip/knee flexion - right Stairs: Yes Stairs Assistance: 3: Mod assist Stairs Assistance Details (indicate cue type and reason): Pt. used left rail and mod hand hold support from PT on right.  Pt. remembered technique from prior hip surgeries.   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  Pt. with h/o THAs bilaterally.    Subjective Data  Subjective: Pt. 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, fast food Arousal/Alertness: Awake/alert Behavior During Therapy: WFL for tasks assessed/performed Overall Cognitive Status: Within Functional Limits for tasks assessed    Balance  Balance Balance Assessed: Yes Dynamic Standing  Balance Dynamic Standing - Balance Support: Bilateral upper extremity supported Dynamic Standing - Level of Assistance: 5: Stand by assistance  End of Session PT - End of Session Equipment Utilized During Treatment: Gait belt;Left knee immobilizer Activity Tolerance: Patient tolerated treatment well Patient left: Other (comment) (on 3 in one in bathroom for sponge bath) Nurse Communication: Mobility status CPM Left Knee CPM Left Knee: On Left Knee Flexion (Degrees): 60 Left Knee Extension (Degrees): 0   GP     Ferman Hamming 01/17/2013, 12:00 PM Weldon Picking PT Acute Rehab Services [phone] Beeper [phone]

## 2013-01-17 NOTE — Progress Notes (Signed)
   CARE MANAGEMENT NOTE 01/17/2013  Patient:  Robert [name], Robert [name]   Account Number:  [id number]  Date Initiated:  01/17/2013  Documentation initiated by:  Jiles Crocker  Subjective/Objective Assessment:   ADMITTED WITH SURGERY     Action/Plan:   HOME HEALTH CARE ARRANGED WITH [facility] AS REQUESTED   Anticipated DC Date:  01/17/2013   Anticipated DC Plan:  HOME W HOME HEALTH SERVICES      DC Planning Services  CM consult      Choice offered to / List presented to:  C-1 Patient        HH arranged  HH-1 RN  HH-2 PT      [facility] agency  [facility]   Status of service:  Completed, signed off Medicare Important Message given?  NA - LOS <3 / Initial given by admissions (If response is "NO", the following Medicare IM given date fields will be blank)  Per UR Regulation:  Reviewed for med. necessity/level of care/duration of stay  Comments:  01/17/2013- B Analilia Geddis RN,BSN,MHA

## 2013-01-17 NOTE — Discharge Summary (Signed)
Patient ID: Robert [name] MRN: [id number] DOB/AGE: [birth year] 62 y.o.  Admit date: 01/15/2013 Discharge date:   Admission Diagnoses: DJD LEFT KNEE Past Medical History  Diagnosis Date  . History of blood transfusion     autologus  . H/O cardiovascular stress test     results good, [facility], done as a baseline  Sept. 2013  . Asthma     rare use of ventolin inhaler, last time a month  or more ago   . Pneumonia     hosp. as a child   . Arthritis     heredity - hip dysplasia, OA  . Hyperlipidemia   . Complication of anesthesia     shivering, hypothermia after THA '93 with GA    Discharge Diagnoses:  Principal Problem:   Osteoarthritis of left knee   Surgeries: Procedure(s): TOTAL KNEE ARTHROPLASTY- left  on 01/15/2013    Consultants:    Discharged Condition: Improved  Hospital Course: Robert [name] is an 62 y.o. male who was admitted 01/15/2013 with a chief complaint of No chief complaint on file. , and found to have a diagnosis of DJD LEFT KNEE.  They were brought to the operating room on 01/15/2013 and underwent Procedure(s): TOTAL KNEE ARTHROPLASTY- left .    They were given perioperative antibiotics: Anti-infectives   Start     Dose/Rate Route Frequency Ordered Stop   01/15/13 1630  ceFAZolin (ANCEF) IVPB 2 g/50 mL premix     2 g 100 mL/hr over 30 Minutes Intravenous Every 6 hours 01/15/13 1621 01/15/13 2228   01/15/13 0600  ceFAZolin (ANCEF) IVPB 2 g/50 mL premix     2 g 100 mL/hr over 30 Minutes Intravenous On call to O.R. 01/14/13 1447 01/15/13 1205    .  They were given sequential compression devices, early ambulation, and Coumadin and Lovenox for DVT prophylaxis.  Recent vital signs: Patient Vitals for the past 24 hrs:  BP Temp Pulse Resp SpO2  01/31/2013 0604 140/77 mmHg 99.3 F (37.4 C) 76 18 98 %  01-31-2013 0400 - - - 18 98 %  01-31-2013 0000 - - - 18 98 %  01/16/13 2126 129/73 mmHg 100.8 F (38.2 C) 94 18 96 %  01/16/13 2010 - 100.1 F (37.8 C) - - -   01/16/13 2000 - - - 18 99 %  01/16/13 1519 - - - 18 -  01/16/13 1300 126/70 mmHg 98 F (36.7 C) 76 18 99 %  01/16/13 1200 - - - 18 -  .  Recent laboratory studies: Dg Knee 1-2 Views Left  01-31-2013   *RADIOLOGY REPORT*  Clinical Data: Status post knee replacement.  LEFT KNEE - 1-2 VIEW  Comparison: None.  Findings: Left total knee arthroplasty is in place.  Surgical staples are noted and there is some gas in the soft tissues.  The device is located and there is no fracture.  IMPRESSION: Left total knee replacement without evidence of complication.   Original Report Authenticated By: Holley Dexter, M.D.    Discharge Medications:     Medication List    STOP taking these medications       aspirin 81 MG tablet     FISH OIL PO     HYDROcodone-acetaminophen 10-325 MG per tablet  Commonly known as:  NORCO     meloxicam 15 MG tablet  Commonly known as:  MOBIC     MULTI-VITAMIN PO     niacin 500 MG CR tablet  Commonly known as:  NIASPAN  TUMS PO      TAKE these medications       atorvastatin 40 MG tablet  Commonly known as:  LIPITOR  Take 40 mg by mouth at bedtime.     DSS 100 MG Caps  Take 100 mg by mouth 2 (two) times daily.     enoxaparin 30 MG/0.3ML injection  Commonly known as:  LOVENOX  Inject 0.3 mLs (30 mg total) into the skin every 12 (twelve) hours. Discontinue when therapeutic on coumadin     methocarbamol 500 MG tablet  Commonly known as:  ROBAXIN  Take 1-2 tablets (500-1,000 mg total) by mouth every 6 (six) hours as needed.     oxyCODONE-acetaminophen 5-325 MG per tablet  Commonly known as:  ROXICET  Take 1 tablet by mouth every 4 (four) hours as needed for pain.     VENTOLIN HFA 108 (90 BASE) MCG/ACT inhaler  Generic drug:  albuterol  Take 1 puff by mouth daily as needed for shortness of breath.     warfarin 5 MG tablet  Commonly known as:  COUMADIN  Take 1 tablet (5 mg total) by mouth daily. Or as directed by pharmacist         Diagnostic Studies: Dg Knee 1-2 Views Left  2013-01-30   *RADIOLOGY REPORT*  Clinical Data: Status post knee replacement.  LEFT KNEE - 1-2 VIEW  Comparison: None.  Findings: Left total knee arthroplasty is in place.  Surgical staples are noted and there is some gas in the soft tissues.  The device is located and there is no fracture.  IMPRESSION: Left total knee replacement without evidence of complication.   Original Report Authenticated By: Holley Dexter, M.D.    They benefited maximally from their hospital stay and there were no complications.     Disposition: 06-Home-Health Care Svc     Discharge Orders   Future Orders Complete By Expires     CPM  As directed     Comments:      Continuous passive motion machine (CPM):      Use the CPM from 0 to 60 for 6-8 hours per day.      You may increase by 5-10 per day.  You may break it up into 2 or 3 sessions per day.      Use CPM for 3-4 weeks or until you are told to stop.    Call MD / Call 911  As directed     Comments:      If you experience chest pain or shortness of breath, CALL 911 and be transported to the hospital emergency room.  If you develope a fever above 101 F, pus (white drainage) or increased drainage or redness at the wound, or calf pain, call your surgeon's office.    Change dressing  As directed     Comments:      Change the dressing daily with sterile 4 x 4 inch gauze dressing and apply TED hose.  You may clean the incision with alcohol prior to redressing.    Constipation Prevention  As directed     Comments:      Drink plenty of fluids.  Prune juice and/or coffee may be helpful.  You may use a stool softener, such as Colace (over the counter) 100 mg twice a day.  Use MiraLax (over the counter) for constipation as needed but this may take several days to work.  Mag Citrate --OR-- Milk of Magnesia may also be used but follow directions  on the label.    Diet - low sodium heart healthy  As directed     Discharge  instructions  As directed     Comments:      1 tab 2 times a day while on narcotics.  STOOL SOFTENERContinuous passive motion machine (CPM):      Use the CPM from 0 to 90 for 6 hours per day.       You may break it up into 2 or 3 sessions per day.      Use CPM for 2 weeks or until you are told to stop.Change the dressing daily with sterile 4 x 4 inch gauze dressing and apply TED hose.  You may clean the incision with alcohol prior to redressing.Use stockings (TED hose) for 4 weeks on both leg(s).  You may remove them at night for sleeping.Total Knee Replacement Care After Refer to this sheet in the next few weeks. These discharge instructions provide you with general information on caring for yourself after you leave the hospital. Your caregiver may also give you specific instructions. Your treatment has been planned according to the most current medical practices available, but unavoidable complications sometimes occur. If you have any problems or questions after discharge, please call your caregiver. Regaining a near full range of motion of your knee within the first 3 to 6 weeks after surgery is critical. HOME CARE INSTRUCTIONS  You may resume a normal diet and activities as directed.  Perform exercises as directed.  Place pillow up under heel at all times except when in CPM or when walking.  You will receive physical therapy daily  Take showers instead of baths until informed otherwise.  Change bandages (dressings)daily Do not take over-the-counter or prescription medicines for pain, discomfort, or fever. Eat a well-balanced diet.  Avoid lifting or driving until you are instructed otherwise.  Make an appointment to see your caregiver for stitches (suture) or staple removal as directed.  If you have been sent home with a continuous passive motion machine (CPM machine), 0-90 degrees 6 hrs a day   2 hrs a shift SEEK MEDICAL CARE IF: You have swelling of your calf or leg.  You develop shortness  of breath or chest pain.  You have redness, swelling, or increasing pain in the wound.  There is pus or any unusual drainage coming from the surgical site.  You notice a bad smell coming from the surgical site or dressing.  The surgical site breaks open after sutures or staples have been removed.  There is persistent bleeding from the suture or staple line.  You are getting worse or are not improving.  You have any other questions or concerns.  SEEK IMMEDIATE MEDICAL CARE IF:  You have a fever.  You develop a rash.  You have difficulty breathing.  You develop any reaction or side effects to medicines given.  Your knee motion is decreasing rather than improving.  MAKE SURE YOU:  Understand these instructions.  Will watch your condition.  Will get help right away if you are not doing well or get worse. 1-2 tablets every 4-6 hrs as needed for pain    Do not put a pillow under the knee. Place it under the heel.  As directed     Comments:      Place pillow under heel at all times except when up walking or in CPM.  You must sleep in it at night    Increase activity slowly as tolerated  As  directed     Patient may shower  As directed     Comments:      You may shower 5 days after surgery, on a seat in shower    TED hose  As directed     Comments:      Use stockings (TED hose) for 4 weeks on both leg(s).  You may remove them at night for sleeping.      Follow-up Information   Follow up with [facility], MD. Schedule an appointment as soon as possible for a visit in 2 weeks.   Contact information:   [address] [place] [address] [phone]        Signed: Clarene Critchley 01/17/2013, 10:18 AM

## 2013-01-17 NOTE — Progress Notes (Signed)
Physical Therapy Treatment Patient Details Name: Robert [name] MRN: [id number] DOB: 10-24-1950 Today's Date: 01/17/2013 Time: 4098-1191 PT Time Calculation (min): 44 min  PT Assessment / Plan / Recommendation  History of Present Illness Pt. 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  Pt. with h/o THAs bilaterally.   PT Comments   Excellent progress in therapy today.  AA knee flexion is 0 to 80 degrees.  Pt anticipates he will dc this pm.  Follow Up Recommendations  Home health PT;Supervision/Assistance - 24 hour;Supervision for mobility/OOB     Does the patient have the potential to tolerate intense rehabilitation     Barriers to Discharge        Equipment Recommendations  None recommended by PT    Recommendations for Other Services    Frequency 7X/week   Progress towards PT Goals Progress towards PT goals: Progressing toward goals  Plan Current plan remains appropriate    Precautions / Restrictions Precautions Precautions: Knee Precaution Comments: educated pt. on sitting knee flexion.  He can do this independently but still needs asist for SAQ and SLR. He is aware of this. Required Braces or Orthoses: Knee Immobilizer - Left Knee Immobilizer - Left: On when out of bed or walking Restrictions Weight Bearing Restrictions: Yes LLE Weight Bearing: Weight bearing as tolerated   Pertinent Vitals/Pain See vitals tab     Mobility  Bed Mobility Bed Mobility: Supine to Sit;Sitting - Scoot to Edge of Bed Supine to Sit: 6: Modified independent (Device/Increase time);HOB flat Sitting - Scoot to Edge of Bed: 6: Modified independent (Device/Increase time) Sit to Supine: Not Tested (comment) Details for Bed Mobility Assistance: Instructed pt. in use of bed sheet to move LE in transitional movements Transfers Transfers: Sit to Stand;Stand to Sit Sit to Stand: 6: Modified independent (Device/Increase time);From bed;With upper extremity assist Stand to Sit: 6: Modified  independent (Device/Increase time);With upper extremity assist;To bed Details for Transfer Assistance: managing at mod I level with no cues needed Ambulation/Gait Ambulation/Gait Assistance: 6: Modified independent (Device/Increase time) Ambulation Distance (Feet): 200 Feet Assistive device: Rolling walker Ambulation/Gait Assistance Details: occasional reminder to move RW a little further forward as he tends to take longer steps.  Otherwise managing his ambulation well at mod I level Gait Pattern: Step-to pattern;Decreased hip/knee flexion - right Stairs: Yes Stairs Assistance: 4: Min assist Stairs Assistance Details (indicate cue type and reason): min support from therapist on pt's R side with his use of L rail.  He needed encounagement to bear weight throught L LE as tolerated as he tends to limit weight on steps. Stair Management Technique: One rail Left Number of Stairs: 3    Exercises Total Joint Exercises Ankle Circles/Pumps: AROM;Both;20 reps Quad Sets: AROM;Both;10 reps Short Arc QuadBarbaraann Boys;Left;10 reps;Supine Straight Leg Raises: AAROM;Left;10 reps;Supine Knee Flexion: AAROM;Left;10 reps;Seated Goniometric ROM: 0 to 80   PT Diagnosis:    PT Problem List:   PT Treatment Interventions:     PT Goals (current goals can now be found in the care plan section)    Visit Information  Last PT Received On: 01/17/13 Assistance Needed: +1 History of Present Illness: Pt. 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  Pt. with h/o THAs bilaterally.    Subjective Data  Subjective: Pt. reports he has been in CPM nearly 4 hours this am   Cognition  Cognition Arousal/Alertness: Awake/alert Behavior During Therapy: WFL for tasks assessed/performed Overall Cognitive Status: Within Functional Limits  for tasks assessed    Balance     End of Session PT - End of Session Equipment Utilized During Treatment: Gait belt;Left knee immobilizer Activity Tolerance: Patient  tolerated treatment well Patient left: in bed;with call bell/phone within reach;Other (comment) (ice pack on left knee) Nurse Communication: Mobility status   GP     Ferman Hamming 01/17/2013, 3:46 PM Weldon Picking PT Acute Rehab Services [phone] [place] [phone]

## 2013-01-17 NOTE — Evaluation (Signed)
Occupational Therapy Evaluation and Discharge Summary Patient Details Name: Robert [name] MRN: [id number] DOB: August 19, 1950 Today's Date: 01/17/2013 Time: 4098-1191 OT Time Calculation (min): 22 min  OT Assessment / Plan / Recommendation History of present illness Pt. 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  Pt. with h/o THAs bilaterally.   Clinical Impression   Pt admitted with above diagnosis and is doing very well with basic adls and will have his daughter at home if necessary to assist him.  No further OT needs.    OT Assessment  Patient does not need any further OT services    Follow Up Recommendations  No OT follow up    Barriers to Discharge      Equipment Recommendations  None recommended by OT    Recommendations for Other Services    Frequency       Precautions / Restrictions Precautions Precautions: Knee Precaution Booklet Issued: Yes (comment) Precaution Comments: pt. educated on no pillow under left knee and on quad sets and ankle pumps.  .  Pt. with pillow under knee upon PT entry into room.  Pt. re-educated on reason for not doing this and he says he will comply. Required Braces or Orthoses: Knee Immobilizer - Left Knee Immobilizer - Left: On when out of bed or walking Restrictions Weight Bearing Restrictions: Yes LLE Weight Bearing: Weight bearing as tolerated   Pertinent Vitals/Pain Pt with very little pain this morning.    ADL  Eating/Feeding: Simulated;Independent Where Assessed - Eating/Feeding: Chair Grooming: Performed;Wash/dry hands;Teeth care;Supervision/safety Where Assessed - Grooming: Unsupported standing Upper Body Bathing: Performed;Set up Where Assessed - Upper Body Bathing: Unsupported sitting Lower Body Bathing: Performed;Set up Where Assessed - Lower Body Bathing: Unsupported sit to stand Upper Body Dressing: Performed;Independent Where Assessed - Upper Body Dressing: Unsupported sitting Lower Body Dressing:  Performed;Minimal assistance Where Assessed - Lower Body Dressing: Unsupported sit to stand Toilet Transfer: Performed;Supervision/safety Toilet Transfer Method: Other (comment) (walked to bathroom) Toilet Transfer Equipment: Comfort height toilet Toileting - Clothing Manipulation and Hygiene: Performed;Supervision/safety Where Assessed - Engineer, mining and Hygiene: Standing Equipment Used: Rolling walker;Knee Immobilizer Transfers/Ambulation Related to ADLs: Pt walked in room with distant S.  Nursing notifed that pt does not need bed alarm on and he can walk to bathroom on his own. ADL Comments: Pt only needed assist for donning L sock.    OT Diagnosis:    OT Problem List:   OT Treatment Interventions:     OT Goals(Current goals can be found in the care plan section) Acute Rehab OT Goals Patient Stated Goal: return to work in [employer]  Visit Information  Last OT Received On: 01/17/13 Assistance Needed: +1 PT/OT Co-Evaluation/Treatment: Yes History of Present Illness: Pt. 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  Pt. with h/o THAs bilaterally.       Prior Functioning     Home Living Family/patient expects to be discharged to:: Private residence Living Arrangements: Spouse/significant other;Children Available Help at Discharge: Family;Available 24 hours/day Type of Home: House Home Access: Stairs to enter [employer] of Steps: 3 Entrance Stairs-Rails: Left Home Layout: Two level;Able to live on main level with bedroom/bathroom Alternate Level Stairs-Number of Steps: 12 Alternate Level Stairs-Rails: Right Home Equipment: Walker - 2 wheels;Bedside commode;Shower seat - built in Prior Function Level of Independence: Independent 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 Dominant Hand: Right         Vision/Perception Vision - History Baseline Vision:  No visual deficits Patient Visual Report: No  change from baseline Vision - Assessment Vision Assessment: Vision not tested   Cognition  Cognition Arousal/Alertness: Awake/alert Behavior During Therapy: WFL for tasks assessed/performed Overall Cognitive Status: Within Functional Limits for tasks assessed    Extremity/Trunk Assessment Upper Extremity Assessment Upper Extremity Assessment: Overall WFL for tasks assessed Lower Extremity Assessment Lower Extremity Assessment: Defer to PT evaluation Cervical / Trunk Assessment Cervical / Trunk Assessment: Normal     Mobility Bed Mobility Bed Mobility: Supine to Sit;Sitting - Scoot to Edge of Bed Supine to Sit: 5: Supervision;HOB flat Sitting - Scoot to Edge of Bed: 6: Modified independent (Device/Increase time) Details for Bed Mobility Assistance: Pt able to move leg off bed w/o assist.  Pt tends to hold onto knee mobilizer to do so.  Encouraged pt to lift it on his own.  also encrouaged pt not to use trapeze as he is returning home today and will not have one. Transfers Transfers: Sit to Stand;Stand to Sit Sit to Stand: 6: Modified independent (Device/Increase time);From bed Stand to Sit: 6: Modified independent (Device/Increase time);To chair/3-in-1 Details for Transfer Assistance: Mod I during adls in room today.     Exercise     Balance Balance Balance Assessed: Yes Dynamic Standing Balance Dynamic Standing - Balance Support: Bilateral upper extremity supported Dynamic Standing - Level of Assistance: 5: Stand by assistance   End of Session OT - End of Session Equipment Utilized During Treatment: Rolling walker;Left knee immobilizer Activity Tolerance: Patient tolerated treatment well Patient left: in chair;Other (comment) (in bathroom bathing.) Nurse Communication: Mobility status;Other (comment) (pt okay to get up by himself) CPM Left Knee CPM Left Knee: On Left Knee Flexion (Degrees): 60 Left Knee Extension (Degrees): 0  GO     Hope Budds 01/17/2013,  11:20 AM [phone]

## 2013-01-17 NOTE — Progress Notes (Signed)
Subjective: 2 Days Post-Op Procedure(s) (LRB): TOTAL KNEE ARTHROPLASTY- left  (Left) Patient reports pain as 4 on 0-10 scale.    Objective: Vital signs in last 24 hours: Temp:  [98 F (36.7 C)-100.8 F (38.2 C)] 99.3 F (37.4 C) (08/08 0604) Pulse Rate:  [76-94] 76 (08/08 0604) Resp:  [18] 18 (08/08 0604) BP: (126-140)/(70-77) 140/77 mmHg (08/08 0604) SpO2:  [96 %-99 %] 98 % (08/08 0604)  Intake/Output from previous day: 08/07 0701 - 08/08 0700 In: 1080 [P.O.:480; I.V.:600] Out: 700 [Urine:700] Intake/Output this shift:     Recent Labs  01/16/13 0500  HGB 10.9*    Recent Labs  01/16/13 0500  WBC 7.3  RBC 3.86*  HCT 31.6*  PLT 212   No results found for this basename: NA, K, CL, CO2, BUN, CREATININE, GLUCOSE, CALCIUM,  in the last 72 hours No results found for this basename: LABPT, INR,  in the last 72 hours  Neurovascular intact Dorsiflexion/Plantar flexion intact Incision: dressing C/D/I and no drainage No cellulitis present Compartment soft  Assessment/Plan: 2 Days Post-Op Procedure(s) (LRB): TOTAL KNEE ARTHROPLASTY- left  (Left) Up with therapy Discharge home with home health  Ezekiah Massie B 01/17/2013, 10:05 AM

## 2013-01-20 ENCOUNTER — Encounter (HOSPITAL_COMMUNITY): Payer: Self-pay | Admitting: Orthopedic Surgery

## 2013-01-20 NOTE — Progress Notes (Signed)
Late entry: SW received a consult for possible placement. PT  At this time is recommending home with [facility] and not SNF.  Clinical Social Worker will sign off for now as social work intervention is no longer needed. Please consult [place] again if new need arises.   Sabino Niemann, MSW [phone]

## 2013-02-07 ENCOUNTER — Other Ambulatory Visit: Payer: Self-pay | Admitting: Orthopedic Surgery

## 2013-05-15 IMAGING — CR DG CHEST 2V
2 series · 2 of 2 positions shown · non-contrast
Comparison: 12/10/2006

CLINICAL DATA: Preop right total hip

CHEST - 2 VIEW

[view not recorded (1 of 2)]
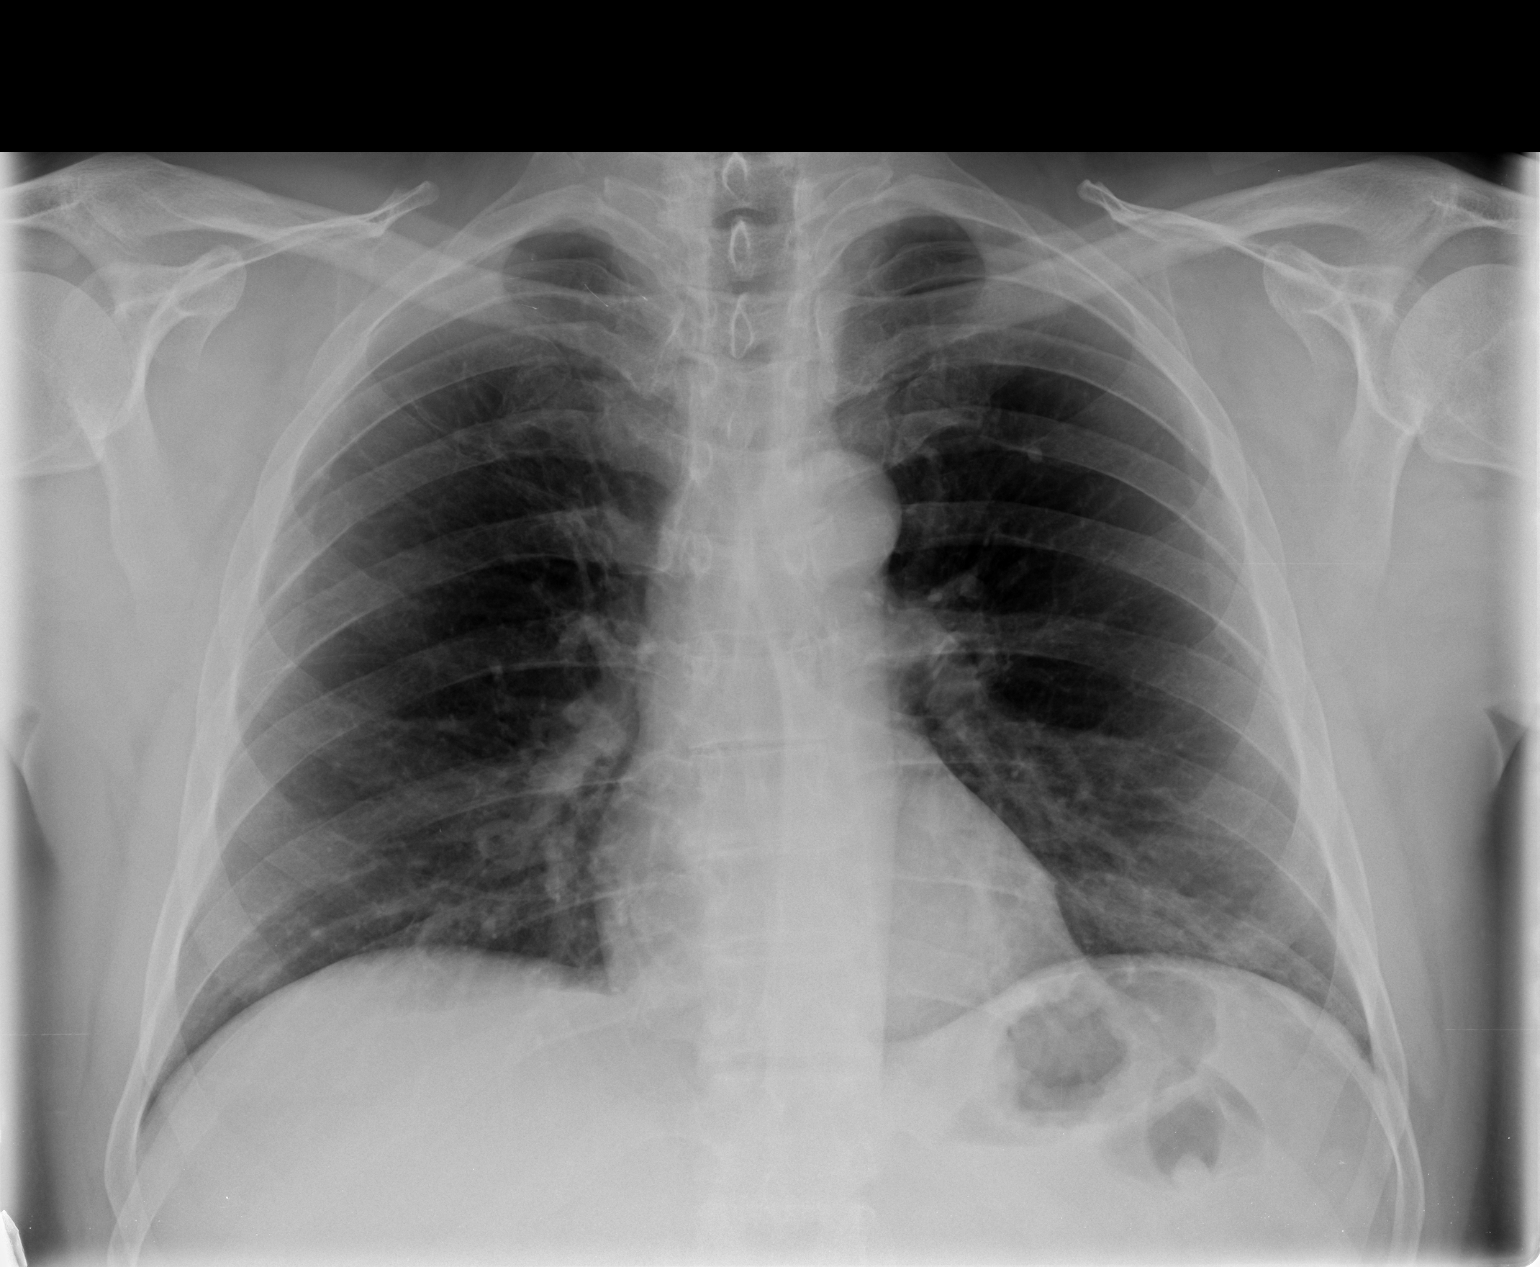

[view not recorded (2 of 2)]
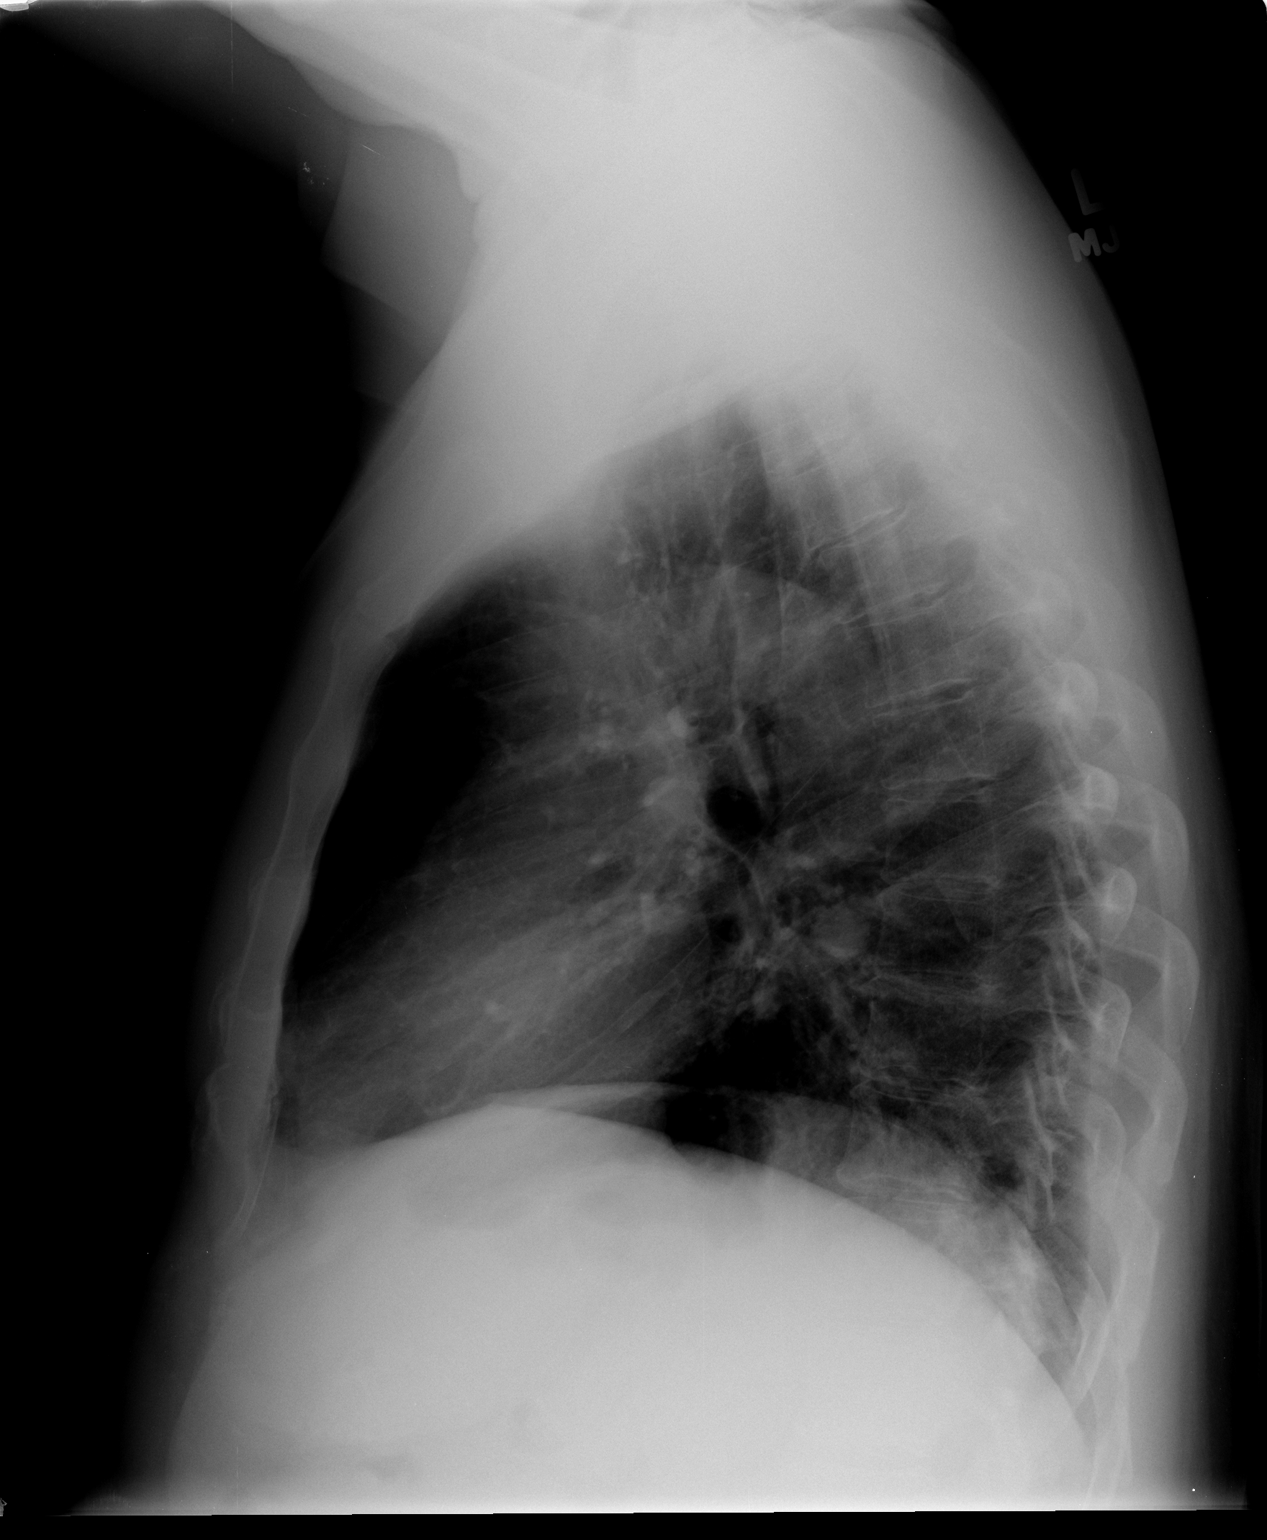

[2 of 2 positions shown; findings below may reference images not displayed]

FINDINGS: Mild left basilar opacity, likely atelectasis.  Lungs are
otherwise clear.  No pleural effusion or pneumothorax.

Cardiomediastinal silhouette is within normal limits.

Mild degenerative changes of the visualized thoracolumbar spine.
IMPRESSION: No evidence of acute cardiopulmonary disease.

## 2013-05-16 ENCOUNTER — Ambulatory Visit: Payer: BC Managed Care – PPO | Admitting: Cardiovascular Disease

## 2014-06-12 HISTORY — PX: CHOLECYSTECTOMY: SHX55

## 2014-07-18 ENCOUNTER — Encounter: Payer: Self-pay | Admitting: *Deleted

## 2014-07-18 ENCOUNTER — Emergency Department
Admission: EM | Admit: 2014-07-18 | Discharge: 2014-07-18 | Disposition: A | Discharge status: Home / Self Care | Payer: BLUE CROSS/BLUE SHIELD | Source: Home / Self Care | Attending: Family Medicine | Admitting: Family Medicine

## 2014-07-18 DIAGNOSIS — K5732 Diverticulitis of large intestine without perforation or abscess without bleeding: Secondary | ICD-10-CM

## 2014-07-18 MED ORDER — METRONIDAZOLE 500 MG PO TABS: 500.0000 mg | ORAL_TABLET | Freq: Three times a day (TID) | ORAL | Status: DC

## 2014-07-18 MED ORDER — CIPROFLOXACIN HCL 500 MG PO TABS: 500.0000 mg | ORAL_TABLET | Freq: Two times a day (BID) | ORAL | Status: DC

## 2014-07-18 NOTE — ED Provider Notes (Addendum)
Robert [name] is a 64 y.o. male who presents to Urgent Care today for abdominal pain. Patient has mild pelvic abdominal pain going to the back present for the last several days. Patient has been mildly constipated but had a normal bowel movement this morning after taking stool softeners. No urinary symptoms. No fevers vomiting or diarrhea. Patient notes a previous history of diverticulosis. Feels well otherwise.   Past Medical History  Diagnosis Date  . History of blood transfusion     autologus  . H/O cardiovascular stress test     results good, [facility], done as a baseline  Sept. 2013  . Asthma     rare use of ventolin inhaler, last time a month  or more ago   . Pneumonia     hosp. as a child   . Arthritis     heredity - hip dysplasia, OA  . Hyperlipidemia   . Complication of anesthesia     shivering, hypothermia after THA '93 with GA   Past Surgical History  Procedure Laterality Date  . Joint replacement  1993    bilateral hip replacement  . Left knee surgery  2010  . Appendectomy    . Wisdom tooth extraction    . Hemorrhoid surgery    . Nasal sinus surgery    . Colonoscopy    . Total hip revision  03/27/2012    Procedure: TOTAL HIP REVISION;  Surgeon: Ninetta Lights, MD;  Location: [facility];  Service: Orthopedics;  Laterality: Right;  with left knee cortisone injection  . Revision total hip arthroplasty      right - revision- 03/2012  . Total hip revision  05/15/2012    Procedure: TOTAL HIP REVISION;  Surgeon: Ninetta Lights, MD;  Location: [facility];  Service: Orthopedics;  Laterality: Left;  . Nm myocar perf wall motion  03/07/2012    EF 57%  low risk scan  . Nm myocar perf wall motion  10/22/2006    EF 53%   . Nm myocar perf wall motion  07/09/2003    MILD ANTERIOR wall thinning w/poss. superimposed anterolateral wall isch .-midventricular apex distrib. of mid to distal LAD  . Cardiac catheterization  07/31/2003    LV norm. EF58%,norm cors w/10% narrowing in mid lad afafter 3rd  diag  -medical therapy  . Total knee arthroplasty Left 01/15/2013    Procedure: TOTAL KNEE ARTHROPLASTY- left ;  Surgeon: Ninetta Lights, MD;  Location: [facility];  Service: Orthopedics;  Laterality: Left;   History  Substance Use Topics  . Smoking status: Never Smoker   . Smokeless tobacco: Never Used  . Alcohol Use: 1.2 oz/week    2 Cans of beer per week     Comment: occasional wine or beer   ROS as above Medications: No current facility-administered medications for this encounter.   Current Outpatient Prescriptions  Medication Sig Dispense Refill  . atorvastatin (LIPITOR) 40 MG tablet Take 40 mg by mouth at bedtime.     . ciprofloxacin (CIPRO) 500 MG tablet Take 1 tablet (500 mg total) by mouth every 12 (twelve) hours. 14 tablet 0  . metroNIDAZOLE (FLAGYL) 500 MG tablet Take 1 tablet (500 mg total) by mouth 3 (three) times daily. 21 tablet 0  . [DISCONTINUED] VENTOLIN HFA 108 (90 BASE) MCG/ACT inhaler Take 1 puff by mouth daily as needed for shortness of breath.      Allergies  Allergen Reactions  . Sulfa Antibiotics     Childhood allergy  Exam:  BP 132/86 mmHg  Pulse 81  Temp(Src) 98.1 F (36.7 C) (Oral)  Ht 5\' 10"  (1.778 m)  Wt 208 lb (94.348 kg)  BMI 29.84 kg/m2  SpO2 98% Gen: Well NAD HEENT: EOMI,  MMM Lungs: Normal work of breathing. CTABL Heart: RRR no MRG Abd: NABS, Soft. Nondistended, mildly tender left lower quadrant without rebound or guarding. No CV angle tenderness to percussion Exts: Brisk capillary refill, warm and well perfused.   No results found for this or any previous visit (from the past 24 hour(s)). No results found.  Assessment and Plan: 64 y.o. male with probable diverticulitis. Patient meets outpatient treatment criteria.  Plan to treat with Cipro and Flagyl. Follow-up with PCP. Present to emergency room if worsening.  Discussed warning signs or symptoms. Please see discharge instructions. Patient expresses understanding.     Gregor Hams, MD 07/18/14 1654  Addendum: Transcription error corrected  Gregor Hams, MD 07/20/14 1149

## 2014-07-18 NOTE — ED Notes (Signed)
Pt states he had stomach pain wed night and no BM for 4 days.  He had a BM today and his stomach is better but says his lower back in the kidney area hurts now.  He is worried about infection.  Denies urinary issues but has noticed some increased urination at night.

## 2014-07-18 NOTE — Discharge Instructions (Signed)
Thank you for coming in today. Use a clear liquid diet for the next day.  Slowly advance your diet.  Do not drink alcohol while taking the antibiotics Discontinue ciprofloxacin if you develop tendon pain. Follow-up with your primary care provider. Go to the emergency room if you get worse   Diverticulitis Diverticulitis is inflammation or infection of small pouches in your colon that form when you have a condition called diverticulosis. The pouches in your colon are called diverticula. Your colon, or large intestine, is where water is absorbed and stool is formed. Complications of diverticulitis can include:  Bleeding.  Severe infection.  Severe pain.  Perforation of your colon.  Obstruction of your colon. CAUSES  Diverticulitis is caused by bacteria. Diverticulitis happens when stool becomes trapped in diverticula. This allows bacteria to grow in the diverticula, which can lead to inflammation and infection. RISK FACTORS People with diverticulosis are at risk for diverticulitis. Eating a diet that does not include enough fiber from fruits and vegetables may make diverticulitis more likely to develop. SYMPTOMS  Symptoms of diverticulitis may include:  Abdominal pain and tenderness. The pain is normally located on the left side of the abdomen, but may occur in other areas.  Fever and chills.  Bloating.  Cramping.  Nausea.  Vomiting.  Constipation.  Diarrhea.  Blood in your stool. DIAGNOSIS  Your health care provider will ask you about your medical history and do a physical exam. You may need to have tests done because many medical conditions can cause the same symptoms as diverticulitis. Tests may include:  Blood tests.  Urine tests.  Imaging tests of the abdomen, including X-rays and CT scans. When your condition is under control, your health care provider may recommend that you have a colonoscopy. A colonoscopy can show how severe your diverticula are and  whether something else is causing your symptoms. TREATMENT  Most cases of diverticulitis are mild and can be treated at home. Treatment may include:  Taking over-the-counter pain medicines.  Following a clear liquid diet.  Taking antibiotic medicines by mouth for 7-10 days. More severe cases may be treated at a hospital. Treatment may include:  Not eating or drinking.  Taking prescription pain medicine.  Receiving antibiotic medicines through an IV tube.  Receiving fluids and nutrition through an IV tube.  Surgery. HOME CARE INSTRUCTIONS   Follow your health care provider's instructions carefully.  Follow a full liquid diet or other diet as directed by your health care provider. After your symptoms improve, your health care provider may tell you to change your diet. He or she may recommend you eat a high-fiber diet. Fruits and vegetables are good sources of fiber. Fiber makes it easier to pass stool.  Take fiber supplements or probiotics as directed by your health care provider.  Only take medicines as directed by your health care provider.  Keep all your follow-up appointments. SEEK MEDICAL CARE IF:   Your pain does not improve.  You have a hard time eating food.  Your bowel movements do not return to normal. SEEK IMMEDIATE MEDICAL CARE IF:   Your pain becomes worse.  Your symptoms do not get better.  Your symptoms suddenly get worse.  You have a fever.  You have repeated vomiting.  You have bloody or black, tarry stools. MAKE SURE YOU:   Understand these instructions.  Will watch your condition.  Will get help right away if you are not doing well or get worse. Document Released: 03/08/2005 Document Revised:  06/03/2013 Document Reviewed: 04/23/2013 ExitCare Patient Information 2015 [place], [place]. This information is not intended to replace advice given to you by your health care provider. Make sure you discuss any questions you have with your health care  provider.

## 2014-07-20 ENCOUNTER — Telehealth: Payer: Self-pay | Admitting: Emergency Medicine

## 2014-08-04 ENCOUNTER — Emergency Department
Admission: EM | Admit: 2014-08-04 | Discharge: 2014-08-04 | Disposition: A | Discharge status: Other Acute Inpatient Hospital | Payer: BLUE CROSS/BLUE SHIELD | Source: Home / Self Care | Attending: Emergency Medicine | Admitting: Emergency Medicine

## 2014-08-04 ENCOUNTER — Encounter: Payer: Self-pay | Admitting: *Deleted

## 2014-08-04 DIAGNOSIS — R1013 Epigastric pain: Secondary | ICD-10-CM | POA: Diagnosis not present

## 2014-08-04 LAB — POCT CBC W AUTO DIFF (K'VILLE URGENT CARE)

## 2014-08-04 MED ORDER — GI COCKTAIL ~~LOC~~
30.0000 mL | Freq: Once | ORAL | Status: AC
  Administered 2014-08-04: 30 mL via ORAL

## 2014-08-04 NOTE — ED Provider Notes (Signed)
CSN: [id number]     Arrival date & time 08/04/14  1555 History   First MD Initiated Contact with Patient 08/04/14 1601     Chief Complaint  Patient presents with  . Abdominal Pain    HPI Epigastric pain since last night. Sharp. Intensity 8 out of 10 .No radiation. He tried probiotic last night and a stool softener, and had a soft bowel movement this morning but that did not help the epigastric pain. No blood or mucus in stool. Has decreased appetite but tolerated half sandwich this morning. Denies nausea or vomiting. States he was seen at urgent care about 2 weeks ago (07/18/14) for diverticulitis and prescribed 2 antibiotics, Cipro and metronidazole and his lower abdominal pain completely resolved after 5 days on that treatment. He states today's abdominal pain is completely different than when he had the diverticulitis symptoms. No documented fever, although he felt chills last night. No focal neurologic symptoms. Denies chest pain or shortness of breath or cough. Denies urinary symptoms. Past Medical History  Diagnosis Date  . History of blood transfusion     autologus  . H/O cardiovascular stress test     results good, [facility], done as a baseline  Sept. 2013  . Asthma     rare use of ventolin inhaler, last time a month  or more ago   . Pneumonia     hosp. as a child   . Arthritis     heredity - hip dysplasia, OA  . Hyperlipidemia   . Complication of anesthesia     shivering, hypothermia after THA '93 with GA   Past Surgical History  Procedure Laterality Date  . Joint replacement  1993    bilateral hip replacement  . Left knee surgery  2010  . Appendectomy    . Wisdom tooth extraction    . Hemorrhoid surgery    . Nasal sinus surgery    . Colonoscopy    . Total hip revision  03/27/2012    Procedure: TOTAL HIP REVISION;  Surgeon: Ninetta Lights, MD;  Location: [facility];  Service: Orthopedics;  Laterality: Right;  with left knee cortisone injection  . Revision total hip  arthroplasty      right - revision- 03/2012  . Total hip revision  05/15/2012    Procedure: TOTAL HIP REVISION;  Surgeon: Ninetta Lights, MD;  Location: [facility];  Service: Orthopedics;  Laterality: Left;  . Nm myocar perf wall motion  03/07/2012    EF 57%  low risk scan  . Nm myocar perf wall motion  10/22/2006    EF 53%   . Nm myocar perf wall motion  07/09/2003    MILD ANTERIOR wall thinning w/poss. superimposed anterolateral wall isch .-midventricular apex distrib. of mid to distal LAD  . Cardiac catheterization  07/31/2003    LV norm. EF58%,norm cors w/10% narrowing in mid lad afafter 3rd diag  -medical therapy  . Total knee arthroplasty Left 01/15/2013    Procedure: TOTAL KNEE ARTHROPLASTY- left ;  Surgeon: Ninetta Lights, MD;  Location: [facility];  Service: Orthopedics;  Laterality: Left;   Family History  Problem Relation Age of Onset  . Colon cancer Mother 67  . Prostate cancer Father   . Leukemia Sister 4   History  Substance Use Topics  . Smoking status: Never Smoker   . Smokeless tobacco: Never Used  . Alcohol Use: 1.2 oz/week    2 Cans of beer per week     Comment:  occasional wine or beer    Review of Systems  All other systems reviewed and are negative.   Allergies  Sulfa antibiotics  Home Medications   Prior to Admission medications   Medication Sig Start Date End Date Taking? Authorizing Provider  atorvastatin (LIPITOR) 40 MG tablet Take 40 mg by mouth at bedtime.  01/18/12   Historical Provider, MD  ciprofloxacin (CIPRO) 500 MG tablet Take 1 tablet (500 mg total) by mouth every 12 (twelve) hours. 07/18/14   Gregor Hams, MD  metroNIDAZOLE (FLAGYL) 500 MG tablet Take 1 tablet (500 mg total) by mouth 3 (three) times daily. 07/18/14   Gregor Hams, MD   BP 160/100 mmHg  Pulse 68  Temp(Src) 97.6 F (36.4 C) (Oral)  Resp 18  Ht 5\' 10"  (1.778 m)  Wt 209 lb (94.802 kg)  BMI 29.99 kg/m2  SpO2 98% Physical Exam  Constitutional: He is oriented to person, place, and  time. He appears well-developed and well-nourished. No distress.  HENT:  Right Ear: External ear normal.  Left Ear: External ear normal.  Nose: Nose normal.  Mouth/Throat: Oropharynx is clear and moist. No oropharyngeal exudate.  Eyes: Conjunctivae are normal. Right eye exhibits no discharge. Left eye exhibits no discharge. No scleral icterus.  Neck: Normal range of motion. Neck supple. No JVD present.  Cardiovascular: Normal rate, regular rhythm and normal heart sounds.  Exam reveals no gallop and no friction rub.   No murmur heard. Pulmonary/Chest: Breath sounds normal. No respiratory distress.  Abdominal: Soft. He exhibits no distension, no abdominal bruit, no pulsatile midline mass and no mass. Bowel sounds are increased. There is no hepatosplenomegaly. There is tenderness (Epigastric and mild tenderness left upper quadrant. No other abdominal tenderness.). There is no rigidity, no rebound, no guarding, no CVA tenderness, no tenderness at McBurney's point and negative Murphy's sign.  Musculoskeletal: Normal range of motion.  Neurological: He is alert and oriented to person, place, and time. No cranial nerve deficit.  Skin: Skin is warm and dry. No rash noted. He is not diaphoretic.  Psychiatric: He has a normal mood and affect.  Nursing note and vitals reviewed.   ED Course  Procedures (including critical care time) Labs Review Labs Reviewed  POCT CBC W AUTO DIFF ([facility])   CBC within normal limits--hemoglobin 14.4, WBC 7.8  Imaging Review No results found.   MDM   1. Abdominal pain, epigastric    GI cocktail given. Patient reevaluated 20 minutes later and pain was still moderately severe 8 out of 10 intensity.  Discussed options, and he will go to local emergency room.--He declined ambulance transport We made copies of this note and CBC for him to take    Jacqulyn Cane, MD 08/04/14 [phone]

## 2014-08-04 NOTE — ED Notes (Signed)
Pt c/o mid epigastric abd pain x last night. Reports some diarrhea this morning. Denies fever, N/V.

## 2014-08-06 ENCOUNTER — Telehealth: Payer: Self-pay | Admitting: *Deleted

## 2015-02-01 ENCOUNTER — Emergency Department
Admission: EM | Admit: 2015-02-01 | Discharge: 2015-02-01 | Disposition: A | Discharge status: Home / Self Care | Payer: BLUE CROSS/BLUE SHIELD | Source: Home / Self Care | Attending: Emergency Medicine | Admitting: Emergency Medicine

## 2015-02-01 ENCOUNTER — Emergency Department (INDEPENDENT_AMBULATORY_CARE_PROVIDER_SITE_OTHER): Payer: BLUE CROSS/BLUE SHIELD

## 2015-02-01 DIAGNOSIS — J209 Acute bronchitis, unspecified: Secondary | ICD-10-CM

## 2015-02-01 DIAGNOSIS — J9811 Atelectasis: Secondary | ICD-10-CM | POA: Diagnosis not present

## 2015-02-01 MED ORDER — ALBUTEROL SULFATE HFA 108 (90 BASE) MCG/ACT IN AERS: 1.0000 | INHALATION_SPRAY | Freq: Four times a day (QID) | RESPIRATORY_TRACT | Status: DC | PRN

## 2015-02-01 MED ORDER — AZITHROMYCIN 250 MG PO TABS: 250.0000 mg | ORAL_TABLET | Freq: Every day | ORAL | Status: DC

## 2015-02-01 NOTE — ED Provider Notes (Signed)
CSN: [id number]     Arrival date & time 02/01/15  1350 History   First MD Initiated Contact with Patient 02/01/15 1420     Chief Complaint  Patient presents with  . Nasal Congestion   (Consider location/radiation/quality/duration/timing/severity/associated sxs/prior Treatment) Patient is a 64 y.o. male presenting with cough. The history is provided by the patient. No language interpreter was used.  Cough Cough characteristics:  Productive Sputum characteristics:  Nondescript Severity:  Moderate Onset quality:  Gradual Timing:  Constant Progression:  Worsening Chronicity:  New Smoker: no   Context: not sick contacts   Relieved by:  Nothing Worsened by:  Nothing tried Ineffective treatments:  None tried Associated symptoms: sore throat   Associated symptoms: no shortness of breath     Past Medical History  Diagnosis Date  . History of blood transfusion     autologus  . H/O cardiovascular stress test     results good, [facility], done as a baseline  Sept. 2013  . Asthma     rare use of ventolin inhaler, last time a month  or more ago   . Pneumonia     hosp. as a child   . Arthritis     heredity - hip dysplasia, OA  . Hyperlipidemia   . Complication of anesthesia     shivering, hypothermia after THA '93 with GA   Past Surgical History  Procedure Laterality Date  . Joint replacement  1993    bilateral hip replacement  . Left knee surgery  2010  . Appendectomy    . Wisdom tooth extraction    . Hemorrhoid surgery    . Nasal sinus surgery    . Colonoscopy    . Total hip revision  03/27/2012    Procedure: TOTAL HIP REVISION;  Surgeon: Ninetta Lights, MD;  Location: [facility];  Service: Orthopedics;  Laterality: Right;  with left knee cortisone injection  . Revision total hip arthroplasty      right - revision- 03/2012  . Total hip revision  05/15/2012    Procedure: TOTAL HIP REVISION;  Surgeon: Ninetta Lights, MD;  Location: [facility];  Service: Orthopedics;  Laterality: Left;  .  Nm myocar perf wall motion  03/07/2012    EF 57%  low risk scan  . Nm myocar perf wall motion  10/22/2006    EF 53%   . Nm myocar perf wall motion  07/09/2003    MILD ANTERIOR wall thinning w/poss. superimposed anterolateral wall isch .-midventricular apex distrib. of mid to distal LAD  . Cardiac catheterization  07/31/2003    LV norm. EF58%,norm cors w/10% narrowing in mid lad afafter 3rd diag  -medical therapy  . Total knee arthroplasty Left 01/15/2013    Procedure: TOTAL KNEE ARTHROPLASTY- left ;  Surgeon: Ninetta Lights, MD;  Location: [facility];  Service: Orthopedics;  Laterality: Left;   Family History  Problem Relation Age of Onset  . Colon cancer Mother 35  . Prostate cancer Father   . Leukemia Sister 4   Social History  Substance Use Topics  . Smoking status: Never Smoker   . Smokeless tobacco: Never Used  . Alcohol Use: No     Comment: occasional wine or beer    Review of Systems  HENT: Positive for sore throat.   Respiratory: Positive for cough. Negative for shortness of breath.   All other systems reviewed and are negative.   Allergies  Sulfa antibiotics  Home Medications   Prior to Admission medications  Medication Sig Start Date End Date Taking? Authorizing Provider  albuterol (PROVENTIL HFA;VENTOLIN HFA) 108 (90 BASE) MCG/ACT inhaler Inhale 1-2 puffs into the lungs every 6 (six) hours as needed for wheezing or shortness of breath. 02/01/15   Fransico Meadow, PA-C  atorvastatin (LIPITOR) 40 MG tablet Take 40 mg by mouth at bedtime.  01/18/12   Historical Provider, MD  azithromycin (ZITHROMAX) 250 MG tablet Take 1 tablet (250 mg total) by mouth daily. Take first 2 tablets together, then 1 every day until finished. 02/01/15   Fransico Meadow, PA-C   BP 123/78 mmHg  Pulse 67  Temp(Src) 97.9 F (36.6 C) (Oral)  Ht 5\' 10"  (1.778 m)  Wt 208 lb (94.348 kg)  BMI 29.84 kg/m2  SpO2 97% Physical Exam  Constitutional: He is oriented to person, place, and time. He appears  well-developed and well-nourished.  HENT:  Head: Normocephalic and atraumatic.  Right Ear: External ear normal.  Left Ear: External ear normal.  Mouth/Throat: Oropharynx is clear and moist.  Eyes: Conjunctivae and EOM are normal. Pupils are equal, round, and reactive to light.  Neck: Normal range of motion.  Cardiovascular: Normal rate.   Pulmonary/Chest: Effort normal and breath sounds normal.  Abdominal: Soft. He exhibits no distension.  Musculoskeletal: Normal range of motion.  Neurological: He is alert and oriented to person, place, and time.  Skin: Skin is warm.  Psychiatric: He has a normal mood and affect.  Nursing note and vitals reviewed.   ED Course  Procedures (including critical care time) Labs Review Labs Reviewed - No data to display  Imaging Review Dg Chest 2 View  02/01/2015   CLINICAL DATA:  Cough, congestion, sinus infection initially that settled in chest over the weekend, slight fever  EXAM: CHEST  2 VIEW  COMPARISON:  03/21/2012  FINDINGS: Normal heart size, mediastinal contours, and pulmonary vascularity.  Mild bronchitic changes.  Subsegmental atelectasis LEFT base.  Calcified granuloma LEFT upper lobe.  No definite infiltrate, pleural effusion or pneumothorax.  Bones unremarkable.  IMPRESSION: Mild bronchitic changes with subsegmental atelectasis at LEFT base.   Electronically Signed   By: Lavonia Dana M.D.   On: 02/01/2015 14:58     MDM   1. Acute bronchitis, unspecified organism    zithromax Albuterol  avs Pt advised to follow up with primary for recheck if not improving in 3-4 days.   Fransico Meadow, PA-C 02/01/15 [address], [place] 02/01/15 [phone]

## 2015-02-01 NOTE — Discharge Instructions (Signed)

## 2015-02-01 NOTE — ED Notes (Signed)
Started Friday with a cold, rested this weekend, drank fluids, feels worse today and feels that it is in his chest Coughing up yellow sputum, has sinus drainage, drowsy, sneezing and a sore throat

## 2015-02-05 MED ORDER — PREDNISONE 10 MG (21) PO TBPK: ORAL_TABLET | ORAL | Status: DC

## 2015-10-14 ENCOUNTER — Encounter: Payer: Self-pay | Admitting: Internal Medicine

## 2015-10-25 DIAGNOSIS — D126 Benign neoplasm of colon, unspecified: Secondary | ICD-10-CM | POA: Insufficient documentation

## 2015-10-25 DIAGNOSIS — E7800 Pure hypercholesterolemia, unspecified: Secondary | ICD-10-CM | POA: Insufficient documentation

## 2015-10-25 DIAGNOSIS — J45909 Unspecified asthma, uncomplicated: Secondary | ICD-10-CM | POA: Insufficient documentation

## 2016-03-10 DIAGNOSIS — J302 Other seasonal allergic rhinitis: Secondary | ICD-10-CM | POA: Insufficient documentation

## 2016-06-12 HISTORY — PX: VENTRAL HERNIA REPAIR: SHX424

## 2016-07-11 ENCOUNTER — Emergency Department (INDEPENDENT_AMBULATORY_CARE_PROVIDER_SITE_OTHER)
Admission: EM | Admit: 2016-07-11 | Discharge: 2016-07-11 | Disposition: A | Discharge status: Home / Self Care | Payer: Medicare Other | Source: Home / Self Care | Attending: Emergency Medicine | Admitting: Emergency Medicine

## 2016-07-11 DIAGNOSIS — J101 Influenza due to other identified influenza virus with other respiratory manifestations: Secondary | ICD-10-CM | POA: Diagnosis not present

## 2016-07-11 LAB — POCT INFLUENZA A/B
INFLUENZA B, POC: NEGATIVE
Influenza A, POC: POSITIVE — AB

## 2016-07-11 MED ORDER — OSELTAMIVIR PHOSPHATE 75 MG PO CAPS: 75.0000 mg | ORAL_CAPSULE | Freq: Two times a day (BID) | ORAL | 0 refills | Status: DC

## 2016-07-11 NOTE — ED Triage Notes (Signed)
Started Sunday afternoon with cough, congestion, and runny nose.  Has been using Dayquil and Nyquil.  Felt worse yesterday.  Last night had chills and fever.  Took dayquil and mucinex today and felt good until 4 pm.

## 2016-07-11 NOTE — ED Provider Notes (Signed)
CSN: [id number]     Arrival date & time 07/11/16  1700 History   None    Chief Complaint  Patient presents with  . Cough  . Nasal Congestion   (Consider location/radiation/quality/duration/timing/severity/associated sxs/prior Treatment) The history is provided by the patient. No language interpreter was used.  Cough  Cough characteristics:  Productive Sputum characteristics:  Nondescript Severity:  Moderate Onset quality:  Gradual Duration:  2 days Timing:  Constant Progression:  Worsening Chronicity:  New Context: sick contacts   Relieved by:  Nothing Worsened by:  Nothing Ineffective treatments:  None tried Associated symptoms: fever and myalgias   Associated symptoms: no shortness of breath and no sore throat   Risk factors: no recent infection     Past Medical History:  Diagnosis Date  . Arthritis    heredity - hip dysplasia, OA  . Asthma    rare use of ventolin inhaler, last time a month  or more ago   . Complication of anesthesia    shivering, hypothermia after THA '93 with GA  . H/O cardiovascular stress test    results good, [facility], done as a baseline  Sept. 2013  . History of blood transfusion    autologus  . Hyperlipidemia   . Pneumonia    hosp. as a child    Past Surgical History:  Procedure Laterality Date  . APPENDECTOMY    . CARDIAC CATHETERIZATION  07/31/2003   LV norm. EF58%,norm cors w/10% narrowing in mid lad afafter 3rd diag  -medical therapy  . COLONOSCOPY    . HEMORRHOID SURGERY    . JOINT REPLACEMENT  1993   bilateral hip replacement  . left knee surgery  2010  . NASAL SINUS SURGERY    . NM MYOCAR PERF WALL MOTION  03/07/2012   EF 57%  low risk scan  . NM MYOCAR PERF WALL MOTION  10/22/2006   EF 53%   . NM MYOCAR PERF WALL MOTION  07/09/2003   MILD ANTERIOR wall thinning w/poss. superimposed anterolateral wall isch .-midventricular apex distrib. of mid to distal LAD  . REVISION TOTAL HIP ARTHROPLASTY     right - revision- 03/2012  .  TOTAL HIP REVISION  03/27/2012   Procedure: TOTAL HIP REVISION;  Surgeon: Ninetta Lights, MD;  Location: [facility];  Service: Orthopedics;  Laterality: Right;  with left knee cortisone injection  . TOTAL HIP REVISION  05/15/2012   Procedure: TOTAL HIP REVISION;  Surgeon: Ninetta Lights, MD;  Location: [place];  Service: Orthopedics;  Laterality: Left;  . TOTAL KNEE ARTHROPLASTY Left 01/15/2013   Procedure: TOTAL KNEE ARTHROPLASTY- left ;  Surgeon: Ninetta Lights, MD;  Location: [facility];  Service: Orthopedics;  Laterality: Left;  . WISDOM TOOTH EXTRACTION     Family History  Problem Relation Age of Onset  . Colon cancer Mother 57  . Prostate cancer Father   . Leukemia Sister 4   Social History  Substance Use Topics  . Smoking status: Never Smoker  . Smokeless tobacco: Never Used  . Alcohol use No     Comment: occasional wine or beer    Review of Systems  Constitutional: Positive for fever.  HENT: Negative for sore throat.   Respiratory: Positive for cough. Negative for shortness of breath.   Musculoskeletal: Positive for myalgias.  All other systems reviewed and are negative.   Allergies  Sulfa antibiotics  Home Medications   Prior to Admission medications   Medication Sig Start Date End Date Taking?  Authorizing Provider  albuterol (PROVENTIL HFA;VENTOLIN HFA) 108 (90 BASE) MCG/ACT inhaler Inhale 1-2 puffs into the lungs every 6 (six) hours as needed for wheezing or shortness of breath. 02/01/15   Fransico Meadow, PA-C  atorvastatin (LIPITOR) 40 MG tablet Take 40 mg by mouth at bedtime.  01/18/12   Historical Provider, MD  azithromycin (ZITHROMAX) 250 MG tablet Take 1 tablet (250 mg total) by mouth daily. Take first 2 tablets together, then 1 every day until finished. 02/01/15   Fransico Meadow, PA-C  oseltamivir (TAMIFLU) 75 MG capsule Take 1 capsule (75 mg total) by mouth every 12 (twelve) hours. 07/11/16   Fransico Meadow, PA-C  predniSONE (STERAPRED UNI-PAK 21 TAB) 10 MG (21) TBPK tablet  Take as directed for 6 days.--Take 6 on day 1, 5 on day 2, 4 on day 3, then 3 tablets on day 4, then 2 tablets on day 5, then 1 on day 6. 02/05/15   Jacqulyn Cane, MD   Meds Ordered and Administered this Visit  Medications - No data to display  BP 131/78 (BP Location: Left Arm)   Pulse 88   Temp 99.2 F (37.3 C) (Oral)   Ht 5\' 10"  (1.778 m)   Wt 215 lb (97.5 kg)   SpO2 96%   BMI 30.85 kg/m  No data found.   Physical Exam  Constitutional: He appears well-developed and well-nourished.  HENT:  Head: Normocephalic and atraumatic.  Eyes: Conjunctivae are normal.  Neck: Neck supple.  Cardiovascular: Normal rate and regular rhythm.   No murmur heard. Pulmonary/Chest: Effort normal and breath sounds normal. No respiratory distress.  Abdominal: Soft. There is no tenderness.  Musculoskeletal: He exhibits no edema.  Neurological: He is alert.  Skin: Skin is warm and dry.  Psychiatric: He has a normal mood and affect.  Nursing note and vitals reviewed.   Urgent Care Course     Procedures (including critical care time)  Labs Review Labs Reviewed  POCT INFLUENZA A/B - Abnormal; Notable for the following:       Result Value   Influenza A, POC Positive (*)    All other components within normal limits    Imaging Review No results found.   Visual Acuity Review  Right Eye Distance:   Left Eye Distance:   Bilateral Distance:    Right Eye Near:   Left Eye Near:    Bilateral Near:         MDM  positve flu test, Pt counseled on influenza.  Pt will take tamiflu.  Pt counseled on symptomatic treatment    1. Influenza A    An After Visit Summary was printed and given to the patient. Meds ordered this encounter  Medications  . oseltamivir (TAMIFLU) 75 MG capsule    Sig: Take 1 capsule (75 mg total) by mouth every 12 (twelve) hours.    Dispense:  10 capsule    Refill:  0    Order Specific Question:   Supervising Provider    Answer:   Burnett Harry, DAVID [place], PA-C 07/11/16 1800

## 2016-07-11 NOTE — Discharge Instructions (Signed)
Return if any problems.

## 2016-08-17 DIAGNOSIS — M545 Low back pain, unspecified: Secondary | ICD-10-CM | POA: Insufficient documentation

## 2016-12-24 ENCOUNTER — Emergency Department (INDEPENDENT_AMBULATORY_CARE_PROVIDER_SITE_OTHER)
Admission: EM | Admit: 2016-12-24 | Discharge: 2016-12-24 | Disposition: A | Discharge status: Home / Self Care | Payer: Medicare Other | Source: Home / Self Care | Attending: Family Medicine | Admitting: Family Medicine

## 2016-12-24 DIAGNOSIS — J4521 Mild intermittent asthma with (acute) exacerbation: Secondary | ICD-10-CM

## 2016-12-24 DIAGNOSIS — J069 Acute upper respiratory infection, unspecified: Secondary | ICD-10-CM

## 2016-12-24 DIAGNOSIS — J0110 Acute frontal sinusitis, unspecified: Secondary | ICD-10-CM

## 2016-12-24 MED ORDER — PREDNISONE 20 MG PO TABS: ORAL_TABLET | ORAL | 0 refills | Status: DC

## 2016-12-24 MED ORDER — METHYLPREDNISOLONE SODIUM SUCC 40 MG IJ SOLR
80.0000 mg | Freq: Once | INTRAMUSCULAR | Status: AC
  Administered 2016-12-24: 80 mg via INTRAMUSCULAR

## 2016-12-24 MED ORDER — AMOXICILLIN-POT CLAVULANATE 875-125 MG PO TABS: 1.0000 | ORAL_TABLET | Freq: Two times a day (BID) | ORAL | 0 refills | Status: DC

## 2016-12-24 MED ORDER — ALBUTEROL SULFATE HFA 108 (90 BASE) MCG/ACT IN AERS: 1.0000 | INHALATION_SPRAY | Freq: Four times a day (QID) | RESPIRATORY_TRACT | 0 refills | Status: DC | PRN

## 2016-12-24 NOTE — ED Triage Notes (Signed)
Pt was taking things out of crawl space last Saturday, and has cough and congestion since.  Had asthma when he was younger, but has had an episode in over 15 years.  This episode started with sinus congestion.  Has used allergy meds and flonase, but not getting better.  Cough a lot at night, and has trouble sleeping.  Has an albuterol inhaler that he has been using at night, but it is not working well.

## 2016-12-24 NOTE — ED Provider Notes (Signed)
CSN: [id number]     Arrival date & time 12/24/16  1426 History   First MD Initiated Contact with Patient 12/24/16 1442     Chief Complaint  Patient presents with  . Cough   (Consider location/radiation/quality/duration/timing/severity/associated sxs/prior Treatment) HPI DONTRAIL BLACKWELL is a 66 y.o. male presenting to UC with c/o 1 week gradually worsening sinus congestion with cough and chest tightness. He was in a crawl space 1 week ago and thinks he may have inhaled some dust or condensation that was down there.  He has tried allergy medication along with Flonase and his inhaler with only mild temporary relief.  Hx of asthma when he was younger but has not had an exacerbation for about 15 years.  Cough is mildly productive, worse at night. Associated sinus congestion and pressure.  Denies fever, chills, n/v/d. No sick contacts.  Prednisone has helped his asthma in the past.    Past Medical History:  Diagnosis Date  . Arthritis    heredity - hip dysplasia, OA  . Asthma    rare use of ventolin inhaler, last time a month  or more ago   . Complication of anesthesia    shivering, hypothermia after THA '93 with GA  . H/O cardiovascular stress test    results good, [facility], done as a baseline  Sept. 2013  . History of blood transfusion    autologus  . Hyperlipidemia   . Pneumonia    hosp. as a child    Past Surgical History:  Procedure Laterality Date  . APPENDECTOMY    . CARDIAC CATHETERIZATION  07/31/2003   LV norm. EF58%,norm cors w/10% narrowing in mid lad afafter 3rd diag  -medical therapy  . COLONOSCOPY    . HEMORRHOID SURGERY    . JOINT REPLACEMENT  1993   bilateral hip replacement  . left knee surgery  2010  . NASAL SINUS SURGERY    . NM MYOCAR PERF WALL MOTION  03/07/2012   EF 57%  low risk scan  . NM MYOCAR PERF WALL MOTION  10/22/2006   EF 53%   . NM MYOCAR PERF WALL MOTION  07/09/2003   MILD ANTERIOR wall thinning w/poss. superimposed anterolateral wall isch  .-midventricular apex distrib. of mid to distal LAD  . REVISION TOTAL HIP ARTHROPLASTY     right - revision- 03/2012  . TOTAL HIP REVISION  03/27/2012   Procedure: TOTAL HIP REVISION;  Surgeon: Ninetta Lights, MD;  Location: [facility];  Service: Orthopedics;  Laterality: Right;  with left knee cortisone injection  . TOTAL HIP REVISION  05/15/2012   Procedure: TOTAL HIP REVISION;  Surgeon: Ninetta Lights, MD;  Location: [facility];  Service: Orthopedics;  Laterality: Left;  . TOTAL KNEE ARTHROPLASTY Left 01/15/2013   Procedure: TOTAL KNEE ARTHROPLASTY- left ;  Surgeon: Ninetta Lights, MD;  Location: [facility];  Service: Orthopedics;  Laterality: Left;  . WISDOM TOOTH EXTRACTION     Family History  Problem Relation Age of Onset  . Colon cancer Mother 29  . Prostate cancer Father   . Leukemia Sister 4   Social History  Substance Use Topics  . Smoking status: Never Smoker  . Smokeless tobacco: Never Used  . Alcohol use No     Comment: occasional wine or beer    Review of Systems  Constitutional: Positive for fatigue. Negative for chills and fever.  HENT: Positive for congestion. Negative for ear pain, sore throat, trouble swallowing and voice change.   Respiratory:  Positive for cough, chest tightness and wheezing. Negative for shortness of breath.   Cardiovascular: Negative for chest pain and palpitations.  Gastrointestinal: Negative for abdominal pain, diarrhea, nausea and vomiting.  Musculoskeletal: Negative for arthralgias, back pain and myalgias.  Skin: Negative for rash.    Allergies  Sulfa antibiotics  Home Medications   Prior to Admission medications   Medication Sig Start Date End Date Taking? Authorizing Provider  meloxicam (MOBIC) 7.5 MG tablet Take 7.5 mg by mouth as needed for pain.   Yes [provider]  albuterol (PROVENTIL HFA;VENTOLIN HFA) 108 (90 Base) MCG/ACT inhaler Inhale 1-2 puffs into the lungs every 6 (six) hours as needed for wheezing or shortness of breath.  12/24/16   Noe Gens, PA-C  amoxicillin-clavulanate (AUGMENTIN) 875-125 MG tablet Take 1 tablet by mouth 2 (two) times daily. One po bid x 7 days 12/24/16   Noe Gens, PA-C  atorvastatin (LIPITOR) 40 MG tablet Take 40 mg by mouth at bedtime.  01/18/12   [provider]  azithromycin (ZITHROMAX) 250 MG tablet Take 1 tablet (250 mg total) by mouth daily. Take first 2 tablets together, then 1 every day until finished. 02/01/15   Fransico Meadow, PA-C  oseltamivir (TAMIFLU) 75 MG capsule Take 1 capsule (75 mg total) by mouth every 12 (twelve) hours. 07/11/16   Fransico Meadow, PA-C  predniSONE (DELTASONE) 20 MG tablet 3 tabs po day one, then 2 po daily x 4 days 12/24/16   Noe Gens, PA-C   Meds Ordered and Administered this Visit   Medications  methylPREDNISolone sodium succinate (SOLU-MEDROL) 40 mg/mL injection 80 mg (80 mg Intramuscular Given 12/24/16 1456)    BP 130/87 (BP Location: Left Arm)   Pulse 69   Temp 98.2 F (36.8 C) (Oral)   Ht 5\' 10"  (1.778 m)   Wt 214 lb (97.1 kg)   SpO2 97%   BMI 30.71 kg/m  No data found.   Physical Exam  Constitutional: He is oriented to person, place, and time. He appears well-developed and well-nourished. No distress.  HENT:  Head: Normocephalic and atraumatic.  Right Ear: Tympanic membrane normal.  Left Ear: Tympanic membrane normal.  Nose: Right sinus exhibits maxillary sinus tenderness and frontal sinus tenderness. Left sinus exhibits maxillary sinus tenderness and frontal sinus tenderness.  Mouth/Throat: Uvula is midline, oropharynx is clear and moist and mucous membranes are normal.  Eyes: EOM are normal.  Neck: Normal range of motion. Neck supple.  Cardiovascular: Normal rate and regular rhythm.   Pulmonary/Chest: Effort normal. No stridor. No respiratory distress. He has wheezes ( faint in lower lung fields). He has no rales.  Musculoskeletal: Normal range of motion.  Lymphadenopathy:    He has no cervical adenopathy.   Neurological: He is alert and oriented to person, place, and time.  Skin: Skin is warm and dry. He is not diaphoretic.  Psychiatric: He has a normal mood and affect. His behavior is normal.  Nursing note and vitals reviewed.   Urgent Care Course     Procedures (including critical care time)  Labs Review Labs Reviewed - No data to display  Imaging Review No results found.    MDM   1. Mild intermittent asthma with exacerbation   2. Upper respiratory tract infection, unspecified type   3. Acute non-recurrent frontal sinusitis    Discussed CXR however, pt will be on antibiotics for sinus infection as well.  Will hold off on CXR.   Solumedrol 80mg  IM  Rx: Augmentin, prednisone, albuterol inhaler  F/u in 1 week if not improving. Patient verbalized understanding and agreement with treatment plan.     Noe Gens, [place] 12/24/16 1710

## 2017-03-19 ENCOUNTER — Emergency Department (INDEPENDENT_AMBULATORY_CARE_PROVIDER_SITE_OTHER)
Admission: EM | Admit: 2017-03-19 | Discharge: 2017-03-19 | Disposition: A | Discharge status: Home / Self Care | Payer: Medicare Other | Source: Home / Self Care | Attending: Family Medicine | Admitting: Family Medicine

## 2017-03-19 ENCOUNTER — Encounter: Payer: Self-pay | Admitting: *Deleted

## 2017-03-19 DIAGNOSIS — J019 Acute sinusitis, unspecified: Secondary | ICD-10-CM

## 2017-03-19 DIAGNOSIS — J069 Acute upper respiratory infection, unspecified: Secondary | ICD-10-CM | POA: Diagnosis not present

## 2017-03-19 MED ORDER — ALBUTEROL SULFATE HFA 108 (90 BASE) MCG/ACT IN AERS: 1.0000 | INHALATION_SPRAY | Freq: Four times a day (QID) | RESPIRATORY_TRACT | 0 refills | Status: DC | PRN

## 2017-03-19 MED ORDER — BENZONATATE 100 MG PO CAPS: 100.0000 mg | ORAL_CAPSULE | Freq: Three times a day (TID) | ORAL | 0 refills | Status: DC

## 2017-03-19 MED ORDER — PREDNISONE 20 MG PO TABS: ORAL_TABLET | ORAL | 0 refills | Status: DC

## 2017-03-19 MED ORDER — AZITHROMYCIN 250 MG PO TABS: 250.0000 mg | ORAL_TABLET | Freq: Every day | ORAL | 0 refills | Status: DC

## 2017-03-19 NOTE — ED Triage Notes (Signed)
Pt c/o sinus HA, nasal congestion, and productive cough x 1wk. Hx of asthma. He has taken Claritin D and used inhaler.

## 2017-03-19 NOTE — ED Provider Notes (Signed)
[facility]    CSN: [id number] Arrival date & time: 03/19/17  1140     History   Chief Complaint Chief Complaint  Patient presents with  . Cough  . Nasal Congestion    HPI Robert [name] is a 66 y.o. male.   HPI  Robert [name] is a 66 y.o. male presenting to UC with hx of asthma c/o worsening chest congestion, chest tightness, sinus congestion and sinus pressure for 1 week.  He has been using his inhaler more often this week as well as taken Claritin D with mild relief. Denies fever, chills, n/v/d. Hx of sinus infections, bronchitis and pneumonia in the past. His daughter was sick with a cough about 1-2 weeks ago.  No recent travel.    Past Medical History:  Diagnosis Date  . Arthritis    heredity - hip dysplasia, OA  . Asthma    rare use of ventolin inhaler, last time a month  or more ago   . Complication of anesthesia    shivering, hypothermia after THA '93 with GA  . H/O cardiovascular stress test    results good, [facility], done as a baseline  Sept. 2013  . History of blood transfusion    autologus  . Hyperlipidemia   . Pneumonia    hosp. as a child     Patient Active Problem List   Diagnosis Date Noted  . Osteoarthritis of left knee 01/17/2013  . Pulled muscle: Likely Serratus muscle in the left mid axillary line. 11/07/2012  . Hyperlipidemia 11/07/2012    Past Surgical History:  Procedure Laterality Date  . APPENDECTOMY    . CARDIAC CATHETERIZATION  07/31/2003   LV norm. EF58%,norm cors w/10% narrowing in mid lad afafter 3rd diag  -medical therapy  . COLONOSCOPY    . HEMORRHOID SURGERY    . JOINT REPLACEMENT  1993   bilateral hip replacement  . left knee surgery  2010  . NASAL SINUS SURGERY    . NM MYOCAR PERF WALL MOTION  03/07/2012   EF 57%  low risk scan  . NM MYOCAR PERF WALL MOTION  10/22/2006   EF 53%   . NM MYOCAR PERF WALL MOTION  07/09/2003   MILD ANTERIOR wall thinning w/poss. superimposed anterolateral wall isch .-midventricular  apex distrib. of mid to distal LAD  . REVISION TOTAL HIP ARTHROPLASTY     right - revision- 03/2012  . TOTAL HIP REVISION  03/27/2012   Procedure: TOTAL HIP REVISION;  Surgeon: Ninetta Lights, MD;  Location: [facility];  Service: Orthopedics;  Laterality: Right;  with left knee cortisone injection  . TOTAL HIP REVISION  05/15/2012   Procedure: TOTAL HIP REVISION;  Surgeon: Ninetta Lights, MD;  Location: [facility];  Service: Orthopedics;  Laterality: Left;  . TOTAL KNEE ARTHROPLASTY Left 01/15/2013   Procedure: TOTAL KNEE ARTHROPLASTY- left ;  Surgeon: Ninetta Lights, MD;  Location: [facility];  Service: Orthopedics;  Laterality: Left;  . WISDOM TOOTH EXTRACTION         Home Medications    Prior to Admission medications   Medication Sig Start Date End Date Taking? Authorizing Provider  albuterol (PROVENTIL HFA;VENTOLIN HFA) 108 (90 Base) MCG/ACT inhaler Inhale 1-2 puffs into the lungs every 6 (six) hours as needed for wheezing or shortness of breath. 03/19/17   Noe Gens, PA-C  atorvastatin (LIPITOR) 40 MG tablet Take 40 mg by mouth at bedtime.  01/18/12   [provider]  azithromycin (  ZITHROMAX) 250 MG tablet Take 1 tablet (250 mg total) by mouth daily. Take first 2 tablets together, then 1 every day until finished. 03/19/17   Noe Gens, PA-C  benzonatate (TESSALON) 100 MG capsule Take 1-2 capsules (100-200 mg total) by mouth every 8 (eight) hours. 03/19/17   Noe Gens, PA-C  meloxicam (MOBIC) 7.5 MG tablet Take 7.5 mg by mouth as needed for pain.    [provider]  predniSONE (DELTASONE) 20 MG tablet 3 tabs po day one, then 2 po daily x 4 days 03/19/17   Noe Gens, PA-C    Family History Family History  Problem Relation Age of Onset  . Colon cancer Mother 78  . Prostate cancer Father   . Leukemia Sister 4    Social History Social History  Substance Use Topics  . Smoking status: Never Smoker  . Smokeless tobacco: Never Used  . Alcohol use No     Comment:  occasional wine or beer     Allergies   Sulfa antibiotics   Review of Systems Review of Systems  Constitutional: Negative for chills and fever.  HENT: Positive for congestion, postnasal drip, rhinorrhea, sinus pain and sinus pressure. Negative for ear pain, sore throat, trouble swallowing and voice change.   Respiratory: Positive for cough, chest tightness and wheezing. Negative for shortness of breath.   Cardiovascular: Negative for chest pain and palpitations.  Gastrointestinal: Negative for abdominal pain, diarrhea, nausea and vomiting.  Musculoskeletal: Negative for arthralgias, back pain and myalgias.  Skin: Negative for rash.  Neurological: Positive for headaches. Negative for dizziness and light-headedness.     Physical Exam Triage Vital Signs ED Triage Vitals  Enc Vitals Group     BP 03/19/17 1209 126/82     Pulse Rate 03/19/17 1209 67     Resp 03/19/17 1209 16     Temp 03/19/17 1209 98.4 F (36.9 C)     Temp Source 03/19/17 1209 Oral     SpO2 03/19/17 1209 98 %     Weight 03/19/17 1210 212 lb (96.2 kg)     Height 03/19/17 1210 5\' 10"  (1.778 m)     Head Circumference --      Peak Flow --      Pain Score 03/19/17 1210 0     Pain Loc --      Pain Edu? --      Excl. in [place]? --    No data found.   Updated Vital Signs BP 126/82 (BP Location: Left Arm)   Pulse 67   Temp 98.4 F (36.9 C) (Oral)   Resp 16   Ht 5\' 10"  (1.778 m)   Wt 212 lb (96.2 kg)   SpO2 98%   BMI 30.42 kg/m   Visual Acuity Right Eye Distance:   Left Eye Distance:   Bilateral Distance:    Right Eye Near:   Left Eye Near:    Bilateral Near:     Physical Exam  Constitutional: He is oriented to person, place, and time. He appears well-developed and well-nourished. No distress.  HENT:  Head: Normocephalic and atraumatic.  Right Ear: Tympanic membrane normal.  Left Ear: Tympanic membrane normal.  Nose: Mucosal edema present. Right sinus exhibits maxillary sinus tenderness and frontal  sinus tenderness. Left sinus exhibits maxillary sinus tenderness and frontal sinus tenderness.  Mouth/Throat: Uvula is midline, oropharynx is clear and moist and mucous membranes are normal.  Eyes: EOM are normal.  Neck: Normal range of motion. Neck  supple.  Cardiovascular: Normal rate and regular rhythm.   Pulmonary/Chest: Effort normal. No stridor. No respiratory distress. He has wheezes (diffuse expiratory). He has no rales.  Musculoskeletal: Normal range of motion.  Lymphadenopathy:    He has no cervical adenopathy.  Neurological: He is alert and oriented to person, place, and time.  Skin: Skin is warm and dry. He is not diaphoretic.  Psychiatric: He has a normal mood and affect. His behavior is normal.  Nursing note and vitals reviewed.    UC Treatments / Results  Labs (all labs ordered are listed, but only abnormal results are displayed) Labs Reviewed - No data to display  EKG  EKG Interpretation None       Radiology No results found.  Procedures Procedures (including critical care time)  Medications Ordered in UC Medications - No data to display   Initial Impression / Assessment and Plan / UC Course  I have reviewed the triage vital signs and the nursing notes.  Pertinent labs & imaging results that were available during my care of the patient were reviewed by me and considered in my medical decision making (see chart for details).     Hx and exam c/w viral URI, however, due to sinus tenderness and hx of pneumonia in the past, encouraged trying prednisone and symptomatic treatment for 2-3 more days, however, if still worsening, encouraged to start taking antibiotic as prescribed. Advised to stop taking Claritin D and start taking Plain Mucinex.   F/u with PCP in 1 week if not improving.   Final Clinical Impressions(s) / UC Diagnoses   Final diagnoses:  Upper respiratory tract infection, unspecified type  Acute rhinosinusitis    New  Prescriptions Discharge Medication List as of 03/19/2017 12:29 PM    START taking these medications   Details  azithromycin (ZITHROMAX) 250 MG tablet Take 1 tablet (250 mg total) by mouth daily. Take first 2 tablets together, then 1 every day until finished., Starting Mon 03/19/2017, Normal    benzonatate (TESSALON) 100 MG capsule Take 1-2 capsules (100-200 mg total) by mouth every 8 (eight) hours., Starting Mon 03/19/2017, Normal    predniSONE (DELTASONE) 20 MG tablet 3 tabs po day one, then 2 po daily x 4 days, Normal         Controlled Substance Prescriptions [place] Controlled Substance Registry consulted? Not Applicable   Tyrell Antonio 03/19/17 1305

## 2017-03-19 NOTE — Discharge Instructions (Signed)
°  You may take 500mg  acetaminophen every 4-6 hours or in combination with ibuprofen 400-600mg  every 6-8 hours as needed for pain, inflammation, and fever.  Be sure to drink at least eight 8oz glasses of water to stay well hydrated and get at least 8 hours of sleep at night, preferably more while sick.   You may try using the prednisone as well as over the counter Mucinex to help with congestion and cough.  If still worsening or worsening sinus pain, you should start taking the antibiotic and be sure to complete the entire course of antibiotic to make sure infection has been properly treated.

## 2017-05-31 DIAGNOSIS — Z8719 Personal history of other diseases of the digestive system: Secondary | ICD-10-CM | POA: Insufficient documentation

## 2017-05-31 HISTORY — DX: Personal history of other diseases of the digestive system: Z87.19

## 2017-09-27 LAB — LIPID PANEL
Cholesterol: 164 (ref 0–200)
HDL: 39 (ref 35–70)
LDL Cholesterol: 110
LDl/HDL Ratio: 4.2
Triglycerides: 131 (ref 40–160)

## 2018-03-22 LAB — COMPREHENSIVE METABOLIC PANEL
Albumin: 4 (ref 3.5–5.0)
Calcium: 9.3 (ref 8.7–10.7)
GFR calc Af Amer: >=90
GFR calc non Af Amer: >=90

## 2018-03-22 LAB — LIPID PANEL
Cholesterol: 159 (ref 0–200)
HDL: 44 (ref 35–70)
LDL Cholesterol: 100
LDl/HDL Ratio: 3.6
Triglycerides: 141 (ref 40–160)

## 2018-03-22 LAB — PSA: PSA: 2.95

## 2018-03-22 LAB — BASIC METABOLIC PANEL
BUN: 11 (ref 4–21)
CO2: 30 — AB (ref 13–22)
Chloride: 102 (ref 99–108)
Creatinine: 0.8 (ref 0.6–1.3)
Glucose: 83
Potassium: 4.7 (ref 3.4–5.3)
Sodium: 140 (ref 137–147)

## 2018-03-22 LAB — HEPATIC FUNCTION PANEL
ALT: 29 (ref 10–40)
AST: 20 (ref 14–40)
Alkaline Phosphatase: 77 (ref 25–125)
Bilirubin, Total: 0.6

## 2018-03-28 DIAGNOSIS — Z8042 Family history of malignant neoplasm of prostate: Secondary | ICD-10-CM | POA: Insufficient documentation

## 2019-04-23 LAB — BASIC METABOLIC PANEL
BUN: 10 (ref 4–21)
CO2: 30 — AB (ref 13–22)
Chloride: 101 (ref 99–108)
Creatinine: 0.8 (ref 0.6–1.3)
Glucose: 85
Potassium: 4.8 (ref 3.4–5.3)
Sodium: 136 — AB (ref 137–147)

## 2019-04-23 LAB — COMPREHENSIVE METABOLIC PANEL
Albumin: 4 (ref 3.5–5.0)
Calcium: 9.4 (ref 8.7–10.7)
GFR calc non Af Amer: >=90

## 2019-04-23 LAB — HEPATIC FUNCTION PANEL
ALT: 21 (ref 10–40)
AST: 18 (ref 14–40)
Alkaline Phosphatase: 84 (ref 25–125)
Bilirubin, Total: 0.6

## 2019-04-23 LAB — PSA: PSA: 2.08

## 2019-04-23 LAB — LIPID PANEL
Cholesterol: 176 (ref 0–200)
HDL: 40 (ref 35–70)
LDL Cholesterol: 114
LDl/HDL Ratio: 4.4
Triglycerides: 153 (ref 40–160)

## 2019-04-29 DIAGNOSIS — L301 Dyshidrosis [pompholyx]: Secondary | ICD-10-CM | POA: Insufficient documentation

## 2019-04-29 DIAGNOSIS — M47812 Spondylosis without myelopathy or radiculopathy, cervical region: Secondary | ICD-10-CM | POA: Insufficient documentation

## 2020-03-22 DIAGNOSIS — Z800 Family history of malignant neoplasm of digestive organs: Secondary | ICD-10-CM | POA: Insufficient documentation

## 2020-03-31 ENCOUNTER — Ambulatory Visit: Payer: Medicare Other | Admitting: Family Medicine

## 2020-04-29 DIAGNOSIS — I10 Essential (primary) hypertension: Secondary | ICD-10-CM | POA: Insufficient documentation

## 2020-04-29 LAB — LIPID PANEL
Cholesterol: 162 (ref 0–200)
HDL: 38 (ref 35–70)
LDL Cholesterol: 99
LDl/HDL Ratio: 4.3
Triglycerides: 176 — AB (ref 40–160)

## 2020-04-29 LAB — COMPREHENSIVE METABOLIC PANEL
Albumin: 4.1 (ref 3.5–5.0)
Calcium: 9.3 (ref 8.7–10.7)
GFR calc non Af Amer: >=90

## 2020-04-29 LAB — BASIC METABOLIC PANEL
BUN: 11 (ref 4–21)
CO2: 30 — AB (ref 13–22)
Chloride: 99 (ref 99–108)
Creatinine: 0.7 (ref 0.6–1.3)
Glucose: 86
Potassium: 4.5 (ref 3.4–5.3)
Sodium: 135 — AB (ref 137–147)

## 2020-04-29 LAB — PSA: PSA: 1.98

## 2020-04-29 LAB — HEPATIC FUNCTION PANEL
ALT: 38 (ref 10–40)
AST: 24 (ref 14–40)
Alkaline Phosphatase: 77 (ref 25–125)
Bilirubin, Total: 0.7

## 2020-05-21 LAB — BASIC METABOLIC PANEL
BUN: 16 (ref 4–21)
CO2: 29 — AB (ref 13–22)
Chloride: 100 (ref 99–108)
Creatinine: 1.2 (ref 0.6–1.3)
Glucose: 100
Potassium: 4.3 (ref 3.4–5.3)
Sodium: 138 (ref 137–147)

## 2020-05-21 LAB — COMPREHENSIVE METABOLIC PANEL
Calcium: 9.4 (ref 8.7–10.7)
GFR calc non Af Amer: 62

## 2020-07-01 LAB — BASIC METABOLIC PANEL
BUN: 11 (ref 4–21)
CO2: 31 — AB (ref 13–22)
Chloride: 100 (ref 99–108)
Creatinine: 0.8 (ref 0.6–1.3)
Glucose: 87
Potassium: 4.9 (ref 3.4–5.3)
Sodium: 138 (ref 137–147)

## 2020-07-01 LAB — HEPATIC FUNCTION PANEL
ALT: 38 (ref 10–40)
AST: 24 (ref 14–40)
Alkaline Phosphatase: 77 (ref 25–125)
Bilirubin, Total: 0.7

## 2020-07-01 LAB — COMPREHENSIVE METABOLIC PANEL: Calcium: 97 — AB (ref 8.7–10.7)

## 2020-07-02 ENCOUNTER — Other Ambulatory Visit: Payer: Self-pay | Admitting: Orthopedic Surgery

## 2020-07-02 DIAGNOSIS — M533 Sacrococcygeal disorders, not elsewhere classified: Secondary | ICD-10-CM

## 2020-07-14 ENCOUNTER — Ambulatory Visit: Payer: Medicare Other | Admitting: Family Medicine

## 2020-07-22 ENCOUNTER — Ambulatory Visit
Admission: RE | Admit: 2020-07-22 | Discharge: 2020-07-22 | Disposition: A | Discharge status: Home / Self Care | Payer: Medicare HMO | Source: Ambulatory Visit | Attending: Orthopedic Surgery | Admitting: Orthopedic Surgery

## 2020-07-22 ENCOUNTER — Other Ambulatory Visit: Payer: Self-pay

## 2020-07-22 DIAGNOSIS — G8929 Other chronic pain: Secondary | ICD-10-CM

## 2020-07-29 ENCOUNTER — Other Ambulatory Visit: Payer: Self-pay | Admitting: Orthopedic Surgery

## 2020-10-29 ENCOUNTER — Other Ambulatory Visit: Payer: Self-pay

## 2020-11-01 ENCOUNTER — Ambulatory Visit: Payer: Medicare HMO | Admitting: Family Medicine

## 2020-11-02 ENCOUNTER — Encounter: Payer: Self-pay | Admitting: Family Medicine

## 2020-11-05 ENCOUNTER — Telehealth: Payer: Self-pay

## 2020-11-05 ENCOUNTER — Encounter: Payer: Self-pay | Admitting: Family Medicine

## 2020-11-05 NOTE — Telephone Encounter (Signed)
A user error has taken place: encounter opened in error, closed for administrative reasons.

## 2020-11-12 ENCOUNTER — Other Ambulatory Visit: Payer: Self-pay

## 2020-11-12 ENCOUNTER — Ambulatory Visit (INDEPENDENT_AMBULATORY_CARE_PROVIDER_SITE_OTHER): Payer: Medicare HMO | Admitting: Family Medicine

## 2020-11-12 ENCOUNTER — Encounter: Payer: Self-pay | Admitting: Family Medicine

## 2020-11-12 VITALS — BP 143/80 | HR 104 | Temp 97.7°F | Ht 68.0 in | Wt 212.2 lb

## 2020-11-12 DIAGNOSIS — I10 Essential (primary) hypertension: Secondary | ICD-10-CM

## 2020-11-12 DIAGNOSIS — E7800 Pure hypercholesterolemia, unspecified: Secondary | ICD-10-CM

## 2020-11-12 MED ORDER — LISINOPRIL 30 MG PO TABS: 30.0000 mg | ORAL_TABLET | Freq: Every day | ORAL | 1 refills | Status: DC

## 2020-11-12 NOTE — Patient Instructions (Signed)
Check blood pressure and heart rate once a day and record the numbers so we can review them at next f/u in 2-3 wks.

## 2020-11-12 NOTE — Progress Notes (Signed)
Office Note 11/12/2020  CC:  Chief Complaint  Patient presents with  . Establish Care    HPI:  Robert [name]. is a 70 y.o. male who is here to establish care and f/u HTN and HLD. Patient's most recent primary MD: Dr. Sarajane Marek with [facility] in [place]. Old records were reviewed prior to and during today's visit.  Doing well, still working full time in [employer]. Last CPE 04/29/20:   Past PCP has been working on his bp---"borderline" until last year and then had to eventually start lisinopril, is on 20mg  qd dosing now. Home monitoring the last few months is avg low 130s over mid 80s on the 20mg  dosing. He works out on [employer] 3d/week and golfs regularly. Working on improved diet and is trying to lose wt--goal 205 lbs.  Has been on atorvastatin 40mg  qd and last LDL was 99 in Nov 2021.   Past Medical History:  Diagnosis Date  . Arthritis    heredity - hip dysplasia, OA  . Dyshydrosis   . Essential hypertension    onset 2020 per prior pcp record  . Family history of colon cancer in mother    rpt colonoscopy due 2026  . Family history of prostate cancer in father   . Hay fever   . History of blood transfusion    autologus  . History of colon polyps    rpt colonoscopy due 2026  . History of diverticulitis    severe diverticulosis in [place] and [place] on 2016 and 2021 colonoscopies  . Hyperlipidemia   . Mild intermittent asthma    rare use of ventolin inhaler  . Obesity, Class I, BMI 30-34.9   . Recurrent low back pain    +SI jt per pt (Murphy/Wainer)    Past Surgical History:  Procedure Laterality Date  . APPENDECTOMY  1964  . CARDIAC CATHETERIZATION  07/31/2003   LV norm. EF58%,norm coronaries.  . CHOLECYSTECTOMY  2016  . COLONOSCOPY     multiple (+FH CC and personal hx of polyps). 04/27/20 no polyps, recall 5 yrs ([facility], Dr. Andres Shad)  . HEMORRHOID SURGERY    . JOINT REPLACEMENT  1993   bilateral hip replacement  . left knee surgery  2010  .  NASAL SINUS SURGERY    . NM MYOCAR PERF WALL MOTION  03/07/2012   EF 57%  low risk scan  . NM MYOCAR PERF WALL MOTION  10/22/2006   EF 53%   . NM MYOCAR PERF WALL MOTION  07/09/2003   MILD ANTERIOR wall thinning w/poss. superimposed anterolateral wall isch .-midventricular apex distrib. of mid to distal LAD  . REVISION TOTAL HIP ARTHROPLASTY     right - revision- 03/2012  . TOTAL HIP REVISION  03/27/2012   Procedure: TOTAL HIP REVISION;  Surgeon: Ninetta Lights, MD;  Location: [place];  Service: Orthopedics;  Laterality: Right;  with left knee cortisone injection  . TOTAL HIP REVISION  05/15/2012   Procedure: TOTAL HIP REVISION;  Surgeon: Ninetta Lights, MD;  Location: [place];  Service: Orthopedics;  Laterality: Left;  . TOTAL KNEE ARTHROPLASTY Left 01/15/2013   Procedure: TOTAL KNEE ARTHROPLASTY- left ;  Surgeon: Ninetta Lights, MD;  Location: [place];  Service: Orthopedics;  Laterality: Left;  Marland Kitchen VENTRAL HERNIA REPAIR  2018   mesh insertion; Surgeron: F Patrica Duel, MD; Location: [facility]; Service;General; Laterality; N/A;  . WISDOM TOOTH EXTRACTION      Family History  Problem Relation Age of  Onset  . Colon cancer Mother 58  . Hyperlipidemia Mother   . Prostate cancer Father   . Leukemia Sister 4  . Stroke Maternal Grandfather   . Throat cancer Paternal Grandfather   . Diabetes Brother     Social History   Socioeconomic History  . Marital status: Married    Spouse name: Not on file  . Number of children: Not on file  . Years of education: Not on file  . Highest education level: Not on file  Occupational History  . Not on file  Tobacco Use  . Smoking status: Never Smoker  . Smokeless tobacco: Never Used  Vaping Use  . Vaping Use: Never used  Substance and Sexual Activity  . Alcohol use: No    Alcohol/week: 2.0 standard drinks    Types: 2 Cans of beer per week    Comment: occasional wine or beer  . Drug use: No  . Sexual activity: Yes    Partners: Female     Comment: married  Other Topics Concern  . Not on file  Social History Narrative   Married, +daughters.   Orig from [place], got PhD in [employer] at [employer] in [place] in 1981.   Occup: Psychologist, educational.   Educ: PhD   No T/A/D   Enjoys golf.   Social Determinants of Health   Financial Resource Strain: Not on file  Food Insecurity: Not on file  Transportation Needs: Not on file  Physical Activity: Not on file  Stress: Not on file  Social Connections: Not on file  Intimate Partner Violence: Not on file    Outpatient Encounter Medications as of 11/12/2020  Medication Sig  . albuterol (PROVENTIL HFA;VENTOLIN HFA) 108 (90 Base) MCG/ACT inhaler Inhale 1-2 puffs into the lungs every 6 (six) hours as needed for wheezing or shortness of breath.  Marland Kitchen atorvastatin (LIPITOR) 40 MG tablet Take 40 mg by mouth at bedtime.   . diclofenac (VOLTAREN) 75 MG EC tablet Take 75 mg by mouth 2 (two) times daily as needed.  . gabapentin (NEURONTIN) 300 MG capsule Take 1 capsule by mouth at bedtime.  Marland Kitchen levocetirizine (XYZAL) 5 MG tablet Take 5 mg by mouth daily as needed.  Marland Kitchen lisinopril (ZESTRIL) 30 MG tablet Take 1 tablet (30 mg total) by mouth daily.  . [DISCONTINUED] lisinopril (ZESTRIL) 20 MG tablet TAKE 1 TABLET (20 MG TOTAL) BY MOUTH DAILY. *KEEP APPOINTMENT ON 07/06/20 FOR ADDITIONAL REFILLS*  . [DISCONTINUED] predniSONE (DELTASONE) 20 MG tablet 3 tabs po day one, then 2 po daily x 4 days   No facility-administered encounter medications on file as of 11/12/2020.    Allergies  Allergen Reactions  . Sulfa Antibiotics     Childhood allergy  . Sulfasalazine Other (See Comments)    unknown    ROS Review of Systems  Constitutional: Negative for appetite change, chills, fatigue and fever.  HENT: Negative for congestion, dental problem, ear pain and sore throat.   Eyes: Negative for discharge, redness and visual disturbance.  Respiratory: Negative for cough, chest  tightness, shortness of breath and wheezing.   Cardiovascular: Negative for chest pain, palpitations and leg swelling.  Gastrointestinal: Negative for abdominal pain, blood in stool, diarrhea, nausea and vomiting.  Genitourinary: Negative for difficulty urinating, dysuria, flank pain, frequency, hematuria and urgency.  Musculoskeletal: Negative for arthralgias, back pain, joint swelling, myalgias and neck stiffness.  Skin: Negative for pallor and rash.  Neurological: Negative for dizziness, speech difficulty, weakness and headaches.  Hematological: Negative for adenopathy. Does not bruise/bleed easily.  Psychiatric/Behavioral: Negative for confusion and sleep disturbance. The patient is not nervous/anxious.     PE; Blood pressure (!) 143/80, pulse (!) 104, temperature 97.7 F (36.5 C), temperature source Other (Comment), height 5\' 8"  (1.727 m), weight 212 lb 4 oz (96.3 kg), SpO2 96 %. Body mass index is 32.27 kg/m.  Gen: Alert, well appearing.  Patient is oriented to person, place, time, and situation. AFFECT: pleasant, lucid thought and speech. PJS:RPRX: no injection, icteris, swelling, or exudate.  EOMI, PERRLA. Mouth: lips without lesion/swelling.  Oral mucosa pink and moist. Oropharynx without erythema, exudate, or swelling.  Neck - No masses or thyromegaly or limitation in range of motion.  No bruits. CV: RRR, no m/r/g.   LUNGS: CTA bilat, nonlabored resps, good aeration in all lung fields. ABD: soft, NT, ND, BS normal.  No hepatospenomegaly or mass.  No bruits. EXT: no clubbing or cyanosis.  no edema.   Pertinent labs:  Lab Results  Component Value Date   TSH 2.39 05/28/2007   Lab Results  Component Value Date   WBC 7.3 01/16/2013   HGB 10.9 (L) 01/16/2013   HCT 31.6 (L) 01/16/2013   MCV 81.9 01/16/2013   PLT 212 01/16/2013   Lab Results  Component Value Date   CREATININE 0.8 07/01/2020   BUN 11 07/01/2020   NA 138 07/01/2020   K 4.9 07/01/2020   CL 100 07/01/2020    CO2 31 (A) 07/01/2020   Lab Results  Component Value Date   ALT 38 07/01/2020   AST 24 07/01/2020   ALKPHOS 77 07/01/2020   BILITOT 0.3 01/03/2013   Lab Results  Component Value Date   CHOL 162 04/29/2020   Lab Results  Component Value Date   HDL 38 04/29/2020   Lab Results  Component Value Date   LDLCALC 99 04/29/2020   Lab Results  Component Value Date   TRIG 176 (A) 04/29/2020   No results found for: [facility] Lab Results  Component Value Date   PSA 1.98 04/29/2020   PSA 2.08 04/23/2019   PSA 2.95 03/22/2018   ASSESSMENT AND PLAN:   New pt; establishing care.  1) HTN: not ideal control. Increase lisinopril to 30mg  qd, goal bp avg 130/80 or better. Plan lytes/cr check at f/u in 2-3 wks.  2) HLD; tolerating atorva 40mg  qd long term. Goal LDL <100. Last LDL Nov 2021 was 99. Plan rpt lipid and hepatic panel at f/u 2-3 wks when fasting.  An After Visit Summary was printed and given to the patient.  Return for 2-3 wks f/u HTN.  Signed:  Crissie Sickles, MD           11/12/2020

## 2020-12-02 ENCOUNTER — Ambulatory Visit: Payer: Medicare HMO | Admitting: Family Medicine

## 2020-12-05 ENCOUNTER — Other Ambulatory Visit: Payer: Self-pay | Admitting: Family Medicine

## 2020-12-07 ENCOUNTER — Other Ambulatory Visit: Payer: Self-pay

## 2020-12-08 ENCOUNTER — Other Ambulatory Visit: Payer: Self-pay

## 2020-12-08 ENCOUNTER — Encounter: Payer: Self-pay | Admitting: Family Medicine

## 2020-12-08 ENCOUNTER — Ambulatory Visit (INDEPENDENT_AMBULATORY_CARE_PROVIDER_SITE_OTHER): Payer: Medicare HMO | Admitting: Family Medicine

## 2020-12-08 VITALS — BP 126/82 | HR 72 | Temp 97.8°F | Resp 16 | Ht 68.0 in | Wt 213.2 lb

## 2020-12-08 DIAGNOSIS — T50905A Adverse effect of unspecified drugs, medicaments and biological substances, initial encounter: Secondary | ICD-10-CM

## 2020-12-08 DIAGNOSIS — I10 Essential (primary) hypertension: Secondary | ICD-10-CM | POA: Diagnosis not present

## 2020-12-08 MED ORDER — LOSARTAN POTASSIUM 100 MG PO TABS: 100.0000 mg | ORAL_TABLET | Freq: Every day | ORAL | 1 refills | Status: DC

## 2020-12-08 NOTE — Progress Notes (Signed)
OFFICE VISIT  12/08/2020  CC:  Chief Complaint  Patient presents with   Follow-up    hypertension    HPI:    Patient is a 70 y.o. male who presents for 1 mo f/u HTN. A/P as of last visit: "1) HTN: not ideal control. Increase lisinopril to 30mg  qd, goal bp avg 130/80 or better. Plan lytes/cr check at f/u in 2-3 wks.   2) HLD; tolerating atorva 40mg  qd long term. Goal LDL <100. Last LDL Nov 2021 was 99. Plan rpt lipid and hepatic panel at f/u 2-3 wks when fasting."  INTERIM HX: Robert [name] is here feeling fine and says he feels improved since getting on inc dose of lisinopril. Home bp's avg 130/80s now. He does says he realizes he has had tickle-cough since starting lisinopril last year. No wheezing or SOB.   Past Medical History:  Diagnosis Date   Arthritis    heredity - hip dysplasia, OA   Dyshydrosis    Essential hypertension    onset 2020 per prior pcp record   Family history of colon cancer in mother    rpt colonoscopy due 2026   Family history of prostate cancer in father    Hay fever    History of blood transfusion    autologus   History of colon polyps    rpt colonoscopy due 2026   History of diverticulitis    severe diverticulosis in [place] and [place] on 2016 and 2021 colonoscopies   Hyperlipidemia    Mild intermittent asthma    rare use of ventolin inhaler   Obesity, Class I, BMI 30-34.9    Recurrent low back pain    +SI jt per pt (Murphy/Wainer)    Past Surgical History:  Procedure Laterality Date   [place]  07/31/2003   LV norm. EF58%,norm coronaries.   CHOLECYSTECTOMY  2016   COLONOSCOPY     multiple (+FH CC and personal hx of polyps). 04/27/20 no polyps, recall 5 yrs ([facility], Dr. Andres Shad)   [place]   bilateral hip replacement   left knee surgery  2010   NASAL SINUS SURGERY     NM MYOCAR PERF WALL MOTION  03/07/2012   EF 57%  low risk scan   NM MYOCAR PERF WALL MOTION   10/22/2006   EF 53%    NM MYOCAR PERF WALL MOTION  07/09/2003   MILD ANTERIOR wall thinning w/poss. superimposed anterolateral wall isch .-midventricular apex distrib. of mid to distal LAD   REVISION TOTAL HIP ARTHROPLASTY     right - revision- 03/2012   TOTAL HIP REVISION  03/27/2012   Procedure: TOTAL HIP REVISION;  Surgeon: Ninetta Lights, MD;  Location: [place];  Service: Orthopedics;  Laterality: Right;  with left knee cortisone injection   TOTAL HIP REVISION  05/15/2012   Procedure: TOTAL HIP REVISION;  Surgeon: Ninetta Lights, MD;  Location: [place];  Service: Orthopedics;  Laterality: Left;   TOTAL KNEE ARTHROPLASTY Left 01/15/2013   Procedure: TOTAL KNEE ARTHROPLASTY- left ;  Surgeon: Ninetta Lights, MD;  Location: [place];  Service: Orthopedics;  Laterality: Left;   VENTRAL HERNIA REPAIR  2018   mesh insertion; Surgeron: F Patrica Duel, MD; Location: [facility]; Service;General; Laterality; N/A;   WISDOM TOOTH EXTRACTION      Outpatient Medications Prior to Visit  Medication Sig Dispense Refill   atorvastatin (LIPITOR) 40 MG tablet Take 40 mg  by mouth at bedtime.      gabapentin (NEURONTIN) 300 MG capsule Take 1 capsule by mouth at bedtime.     lisinopril (ZESTRIL) 30 MG tablet Take 1 tablet (30 mg total) by mouth daily. 30 tablet 1   albuterol (PROVENTIL HFA;VENTOLIN HFA) 108 (90 Base) MCG/ACT inhaler Inhale 1-2 puffs into the lungs every 6 (six) hours as needed for wheezing or shortness of breath. (Patient not taking: Reported on 12/08/2020) 1 Inhaler 0   diclofenac (VOLTAREN) 75 MG EC tablet Take 75 mg by mouth 2 (two) times daily as needed. (Patient not taking: Reported on 12/08/2020)     hydrOXYzine (ATARAX/VISTARIL) 25 MG tablet Take 25-50 mg by mouth at bedtime as needed. (Patient not taking: Reported on 12/08/2020)     ketoconazole (NIZORAL) 2 % shampoo Apply topically 2 (two) times a week. (Patient not taking: Reported on 12/08/2020)     levocetirizine (XYZAL) 5 MG tablet  Take 5 mg by mouth daily as needed. (Patient not taking: Reported on 12/08/2020)     No facility-administered medications prior to visit.    Allergies  Allergen Reactions   Sulfa Antibiotics     Childhood allergy   Sulfasalazine Other (See Comments)    unknown    ROS As per HPI  PE: Vitals with BMI 12/08/2020 11/12/2020 03/19/2017  Height 5\' 8"  5\' 8"  5\' 10"   Weight 213 lbs 3 oz 212 lbs 4 oz 212 lbs  BMI 32.42 17.51 02.58  Systolic 527 782 423  Diastolic 82 80 82  Pulse 72 104 67   Gen: Alert, well appearing.  Patient is oriented to person, place, time, and situation. AFFECT: pleasant, lucid thought and speech. CV: RRR, no m/r/g.   LUNGS: CTA bilat, nonlabored resps, good aeration in all lung fields. EXT: no clubbing or cyanosis.  no edema.    LABS:  Lab Results  Component Value Date   TSH 2.39 05/28/2007   Lab Results  Component Value Date   WBC 7.3 01/16/2013   HGB 10.9 (L) 01/16/2013   HCT 31.6 (L) 01/16/2013   MCV 81.9 01/16/2013   PLT 212 01/16/2013   Lab Results  Component Value Date   IRON 137 05/28/2007   FERRITIN 67.1 05/28/2007    Lab Results  Component Value Date   CREATININE 0.8 07/01/2020   BUN 11 07/01/2020   NA 138 07/01/2020   K 4.9 07/01/2020   CL 100 07/01/2020   CO2 31 (A) 07/01/2020   Lab Results  Component Value Date   ALT 38 07/01/2020   AST 24 07/01/2020   ALKPHOS 77 07/01/2020   BILITOT 0.3 01/03/2013   Lab Results  Component Value Date   CHOL 162 04/29/2020   Lab Results  Component Value Date   HDL 38 04/29/2020   Lab Results  Component Value Date   LDLCALC 99 04/29/2020   Lab Results  Component Value Date   TRIG 176 (A) 04/29/2020   No results found for: CHOLHDL Lab Results  Component Value Date   PSA 1.98 04/29/2020   PSA 2.08 04/23/2019   PSA 2.95 03/22/2018   IMPRESSION AND PLAN:  HTN: well controlled now on lisinopril 30mg  qd but he has bothersome cough side effect from this med. Will change him over  to losartan at the 100 mg qd dosing and he'll continue to follow bp/hr at home and we'll reconvene to review in 1 mo.  HLD: tolerating statin and chol levels have been stable longterm. Plan recheck lipids at  next cpe approx 04/2021.  An After Visit Summary was printed and given to the patient.  FOLLOW UP: Return in about 4 weeks (around 01/05/2021) for f/u bp.  Signed:  Crissie Sickles, MD           12/08/2020

## 2020-12-31 ENCOUNTER — Other Ambulatory Visit: Payer: Self-pay | Admitting: Family Medicine

## 2021-01-26 ENCOUNTER — Telehealth: Payer: Self-pay | Admitting: Family Medicine

## 2021-01-26 NOTE — Telephone Encounter (Signed)
Left message for patient to schedule Annual Wellness Visit.  Please schedule with Nurse Health Advisor Julie Greer, RN at [facility]. Please call [phone] ask for Kathy  

## 2021-02-02 ENCOUNTER — Ambulatory Visit (INDEPENDENT_AMBULATORY_CARE_PROVIDER_SITE_OTHER): Payer: Medicare HMO | Admitting: *Deleted

## 2021-02-02 ENCOUNTER — Other Ambulatory Visit: Payer: Self-pay | Admitting: Family Medicine

## 2021-02-02 DIAGNOSIS — Z0000 Encounter for general adult medical examination without abnormal findings: Secondary | ICD-10-CM

## 2021-02-02 NOTE — Patient Instructions (Signed)
Robert [name] , Thank you for taking time to come for your Medicare Wellness Visit. I appreciate your ongoing commitment to your health goals. Please review the following plan we discussed and let me know if I can assist you in the future.   Screening recommendations/referrals: Colonoscopy: up to date Recommended yearly ophthalmology/optometry visit for glaucoma screening and checkup Recommended yearly dental visit for hygiene and checkup  Vaccinations: Influenza vaccine: Education provided Pneumococcal vaccine: up to date Tdap vaccine: up to date Shingles vaccine: Education provided    Advanced directives: Education provided  Conditions/risks identified: na    Preventive Care 70 Years and Older, Male Preventive care refers to lifestyle choices and visits with your health care provider that can promote health and wellness. What does preventive care include? A yearly physical exam. This is also called an annual well check. Dental exams once or twice a year. Routine eye exams. Ask your health care provider how often you should have your eyes checked. Personal lifestyle choices, including: Daily care of your teeth and gums. Regular physical activity. Eating a healthy diet. Avoiding tobacco and drug use. Limiting alcohol use. Practicing safe sex. Taking low doses of aspirin every day. Taking vitamin and mineral supplements as recommended by your health care provider. What happens during an annual well check? The services and screenings done by your health care provider during your annual well check will depend on your age, overall health, lifestyle risk factors, and family history of disease. Counseling  Your health care provider may ask you questions about your: Alcohol use. Tobacco use. Drug use. Emotional well-being. Home and relationship well-being. Sexual activity. Eating habits. History of falls. Memory and ability to understand (cognition). Work and work  Statistician. Screening  You may have the following tests or measurements: Height, weight, and BMI. Blood pressure. Lipid and cholesterol levels. These may be checked every 5 years, or more frequently if you are over 59 years old. Skin check. Lung cancer screening. You may have this screening every year starting at age 29 if you have a 30-pack-year history of smoking and currently smoke or have quit within the past 15 years. Fecal occult blood test (FOBT) of the stool. You may have this test every year starting at age 67. Flexible sigmoidoscopy or colonoscopy. You may have a sigmoidoscopy every 5 years or a colonoscopy every 10 years starting at age 19. Prostate cancer screening. Recommendations will vary depending on your family history and other risks. Hepatitis C blood test. Hepatitis B blood test. Sexually transmitted disease (STD) testing. Diabetes screening. This is done by checking your blood sugar (glucose) after you have not eaten for a while (fasting). You may have this done every 1-3 years. Abdominal aortic aneurysm (AAA) screening. You may need this if you are a current or former smoker. Osteoporosis. You may be screened starting at age 74 if you are at high risk. Talk with your health care provider about your test results, treatment options, and if necessary, the need for more tests. Vaccines  Your health care provider may recommend certain vaccines, such as: Influenza vaccine. This is recommended every year. Tetanus, diphtheria, and acellular pertussis (Tdap, Td) vaccine. You may need a Td booster every 10 years. Zoster vaccine. You may need this after age 31. Pneumococcal 13-valent conjugate (PCV13) vaccine. One dose is recommended after age 11. Pneumococcal polysaccharide (PPSV23) vaccine. One dose is recommended after age 29. Talk to your health care provider about which screenings and vaccines you need and how often you  need them. This information is not intended to replace  advice given to you by your health care provider. Make sure you discuss any questions you have with your health care provider. Document Released: 06/25/2015 Document Revised: 02/16/2016 Document Reviewed: 03/30/2015 Elsevier Interactive Patient Education  2017 [place] Prevention in the Home Falls can cause injuries. They can happen to people of all ages. There are many things you can do to make your home safe and to help prevent falls. What can I do on the outside of my home? Regularly fix the edges of walkways and driveways and fix any cracks. Remove anything that might make you trip as you walk through a door, such as a raised step or threshold. Trim any bushes or trees on the path to your home. Use bright outdoor lighting. Clear any walking paths of anything that might make someone trip, such as rocks or tools. Regularly check to see if handrails are loose or broken. Make sure that both sides of any steps have handrails. Any raised decks and porches should have guardrails on the edges. Have any leaves, snow, or ice cleared regularly. Use sand or salt on walking paths during winter. Clean up any spills in your garage right away. This includes oil or grease spills. What can I do in the bathroom? Use night lights. Install grab bars by the toilet and in the tub and shower. Do not use towel bars as grab bars. Use non-skid mats or decals in the tub or shower. If you need to sit down in the shower, use a plastic, non-slip stool. Keep the floor dry. Clean up any water that spills on the floor as soon as it happens. Remove soap buildup in the tub or shower regularly. Attach bath mats securely with double-sided non-slip rug tape. Do not have throw rugs and other things on the floor that can make you trip. What can I do in the bedroom? Use night lights. Make sure that you have a light by your bed that is easy to reach. Do not use any sheets or blankets that are too big for your bed.  They should not hang down onto the floor. Have a firm chair that has side arms. You can use this for support while you get dressed. Do not have throw rugs and other things on the floor that can make you trip. What can I do in the kitchen? Clean up any spills right away. Avoid walking on wet floors. Keep items that you use a lot in easy-to-reach places. If you need to reach something above you, use a strong step stool that has a grab bar. Keep electrical cords out of the way. Do not use floor polish or wax that makes floors slippery. If you must use wax, use non-skid floor wax. Do not have throw rugs and other things on the floor that can make you trip. What can I do with my stairs? Do not leave any items on the stairs. Make sure that there are handrails on both sides of the stairs and use them. Fix handrails that are broken or loose. Make sure that handrails are as long as the stairways. Check any carpeting to make sure that it is firmly attached to the stairs. Fix any carpet that is loose or worn. Avoid having throw rugs at the top or bottom of the stairs. If you do have throw rugs, attach them to the floor with carpet tape. Make sure that you have a light switch  at the top of the stairs and the bottom of the stairs. If you do not have them, ask someone to add them for you. What else can I do to help prevent falls? Wear shoes that: Do not have high heels. Have rubber bottoms. Are comfortable and fit you well. Are closed at the toe. Do not wear sandals. If you use a stepladder: Make sure that it is fully opened. Do not climb a closed stepladder. Make sure that both sides of the stepladder are locked into place. Ask someone to hold it for you, if possible. Clearly mark and make sure that you can see: Any grab bars or handrails. First and last steps. Where the edge of each step is. Use tools that help you move around (mobility aids) if they are needed. These  include: Canes. Walkers. Scooters. Crutches. Turn on the lights when you go into a dark area. Replace any light bulbs as soon as they burn out. Set up your furniture so you have a clear path. Avoid moving your furniture around. If any of your floors are uneven, fix them. If there are any pets around you, be aware of where they are. Review your medicines with your doctor. Some medicines can make you feel dizzy. This can increase your chance of falling. Ask your doctor what other things that you can do to help prevent falls. This information is not intended to replace advice given to you by your health care provider. Make sure you discuss any questions you have with your health care provider. Document Released: 03/25/2009 Document Revised: 11/04/2015 Document Reviewed: 07/03/2014 Elsevier Interactive Patient Education  2017 [employer].

## 2021-02-02 NOTE — Progress Notes (Signed)
Subjective:   Robert [name]. is a 70 y.o. male who presents for Medicare Annual/Subsequent preventive examination.  I connected with  Robert [name]. on 02/02/21 by a telephone enabled telemedicine application and verified that I am speaking with the correct person using two identifiers.   I discussed the limitations of evaluation and management by telemedicine. The patient expressed understanding and agreed to proceed.   Review of Systems    NA Cardiac Risk Factors include: hypertension;male gender     Objective:    Today's Vitals   There is no height or weight on file to calculate BMI.  Advanced Directives 02/02/2021 01/03/2013 05/16/2012 05/06/2012 03/29/2012 03/21/2012  Does Patient Have a Medical Advance Directive? Yes Patient has advance directive, copy not in chart Patient does not have advance directive Patient does not have advance directive;Patient would like information Patient does not have advance directive Patient does not have advance directive;Patient would like information  Type of Advance Directive Living will;Healthcare Power of Attorney - - Living will - -  Copy of [place] in Chart? No - copy requested - - - - -  Would patient like information on creating a medical advance directive? No - Patient declined - - Advance directive packet given Advance directive packet given Advance directive packet given  Pre-existing out of facility DNR order (yellow form or pink MOST form) - - No - - -    Current Medications (verified) Outpatient Encounter Medications as of 02/02/2021  Medication Sig   atorvastatin (LIPITOR) 40 MG tablet Take 40 mg by mouth at bedtime.    gabapentin (NEURONTIN) 300 MG capsule Take 1 capsule by mouth at bedtime.   losartan (COZAAR) 100 MG tablet Take 1 tablet (100 mg total) by mouth daily.   albuterol (PROVENTIL HFA;VENTOLIN HFA) 108 (90 Base) MCG/ACT inhaler Inhale 1-2 puffs into the lungs every 6 (six) hours as needed for  wheezing or shortness of breath. (Patient not taking: Reported on 12/08/2020)   diclofenac (VOLTAREN) 75 MG EC tablet Take 75 mg by mouth 2 (two) times daily as needed. (Patient not taking: No sig reported)   hydrOXYzine (ATARAX/VISTARIL) 25 MG tablet Take 25-50 mg by mouth at bedtime as needed. (Patient not taking: No sig reported)   ketoconazole (NIZORAL) 2 % shampoo Apply topically 2 (two) times a week. (Patient not taking: No sig reported)   levocetirizine (XYZAL) 5 MG tablet Take 5 mg by mouth daily as needed. (Patient not taking: No sig reported)   No facility-administered encounter medications on file as of 02/02/2021.    Allergies (verified) Sulfa antibiotics, Sulfasalazine, and Lisinopril   History: Past Medical History:  Diagnosis Date   Arthritis    heredity - hip dysplasia, OA   Dyshydrosis    Essential hypertension    onset 2020 per prior pcp record   Family history of colon cancer in mother    rpt colonoscopy due 2026   Family history of prostate cancer in father    Hay fever    History of blood transfusion    autologus   History of colon polyps    rpt colonoscopy due 2026   History of diverticulitis    severe diverticulosis in [place] and [place] on 2016 and 2021 colonoscopies   Hyperlipidemia    Mild intermittent asthma    rare use of ventolin inhaler   Obesity, Class I, BMI 30-34.9    Recurrent low back pain    +SI jt per pt (Murphy/Wainer)  Past Surgical History:  Procedure Laterality Date   [place]  07/31/2003   LV norm. EF58%,norm coronaries.   CHOLECYSTECTOMY  2016   COLONOSCOPY     multiple (+FH CC and personal hx of polyps). 04/27/20 no polyps, recall 5 yrs ([facility], Dr. Andres Shad)   [place]   bilateral hip replacement   left knee surgery  2010   NASAL SINUS SURGERY     NM MYOCAR PERF WALL MOTION  03/07/2012   EF 57%  low risk scan   NM MYOCAR PERF WALL MOTION  10/22/2006   EF 53%     NM MYOCAR PERF WALL MOTION  07/09/2003   MILD ANTERIOR wall thinning w/poss. superimposed anterolateral wall isch .-midventricular apex distrib. of mid to distal LAD   REVISION TOTAL HIP ARTHROPLASTY     right - revision- 03/2012   TOTAL HIP REVISION  03/27/2012   Procedure: TOTAL HIP REVISION;  Surgeon: Ninetta Lights, MD;  Location: [name];  Service: Orthopedics;  Laterality: Right;  with left knee cortisone injection   TOTAL HIP REVISION  05/15/2012   Procedure: TOTAL HIP REVISION;  Surgeon: Ninetta Lights, MD;  Location: [place];  Service: Orthopedics;  Laterality: Left;   TOTAL KNEE ARTHROPLASTY Left 01/15/2013   Procedure: TOTAL KNEE ARTHROPLASTY- left ;  Surgeon: Ninetta Lights, MD;  Location: [place];  Service: Orthopedics;  Laterality: Left;   VENTRAL HERNIA REPAIR  2018   mesh insertion; Surgeron: F Patrica Duel, MD; Location: [facility]; Service;General; Laterality; N/A;   WISDOM TOOTH EXTRACTION     Family History  Problem Relation Age of Onset   Colon cancer Mother 72   Hyperlipidemia Mother    Prostate cancer Father    Leukemia Sister 4   Stroke Maternal Grandfather    Throat cancer Paternal Grandfather    Diabetes Brother    Social History   Socioeconomic History   Marital status: Married    Spouse name: Not on file   Number of children: Not on file   Years of education: Not on file   Highest education level: Not on file  Occupational History   Not on file  Tobacco Use   Smoking status: Never   Smokeless tobacco: Never  Vaping Use   Vaping Use: Never used  Substance and Sexual Activity   Alcohol use: No    Alcohol/week: 2.0 standard drinks    Types: 2 Cans of beer per week    Comment: occasional wine or beer   Drug use: No   Sexual activity: Yes    Partners: Female    Comment: married  Other Topics Concern   Not on file  Social History Narrative   Married, +daughters.   Orig from [place], got PhD in [employer] at [place] in  [place] in 1981.   Occup: Psychologist, educational.   Educ: PhD   No T/A/D   Enjoys golf.   Social Determinants of Health   Financial Resource Strain: Low Risk    Difficulty of Paying Living Expenses: Not hard at all  Food Insecurity: No Food Insecurity   Worried About Charity fundraiser in the Last Year: Never true   [place] in the Last Year: Never true  Transportation Needs: No Transportation Needs   Lack of Transportation (Medical): No   Lack of Transportation (Non-Medical): No  Physical Activity: Sufficiently Active  Days of Exercise per Week: 4 days   Minutes of Exercise per Session: 40 min  Stress: No Stress Concern Present   Feeling of Stress : Only a little  Social Connections: Engineer, building services of Communication with Friends and Family: More than three times a week   Frequency of Social Gatherings with Friends and Family: More than three times a week   Attends Religious Services: More than 4 times per year   Active Member of [employer] or Organizations: Yes   Attends Music therapist: More than 4 times per year   Marital Status: Married    Tobacco Counseling Counseling given: Not Answered   Clinical Intake:  Pre-visit preparation completed: Yes  Pain : No/denies pain     Nutritional Risks: None Diabetes: No  How often do you need to have someone help you when you read instructions, pamphlets, or other written materials from your doctor or pharmacy?: 1 - Never  Diabetic? NO  Interpreter Needed?: No  Information entered by :: Leroy Kennedy LPN   Activities of Daily Living In your present state of health, do you have any difficulty performing the following activities: 02/02/2021  Hearing? N  Vision? N  Difficulty concentrating or making decisions? N  Walking or climbing stairs? N  Dressing or bathing? N  Doing errands, shopping? N  Preparing Food and eating ? N  Using the Toilet? N  In the past six months,  have you accidently leaked urine? N  Do you have problems with loss of bowel control? N  Managing your Medications? N  Managing your Finances? N  Housekeeping or managing your Housekeeping? N  Some recent data might be hidden    Patient Care Team: Tammi Sou, MD as PCP - General (Family Medicine) Marica Otter, [place] (Optometry) Blackard, Julieta Bellini, MD as Consulting Physician (Gastroenterology) Troy Sine, MD as Consulting Physician (Cardiology)  Indicate any recent Medical Services you may have received from other than [facility] providers in the past year (date may be approximate).     Assessment:   This is a routine wellness examination for Mikal.  Hearing/Vision screen Hearing Screening - Comments:: No trouble hearing Vision Screening - Comments:: Up to date Dr. Marica Otter  Dietary issues and exercise activities discussed: Current Exercise Habits: Home exercise routine, Type of exercise: walking;strength training/weights (golf), Time (Minutes): 35, Frequency (Times/Week): 5, Weekly Exercise (Minutes/Week): 175, Intensity: Moderate   Goals Addressed             This Visit's Progress    Patient Stated       Increase physical activity        Depression Screen PHQ 2/9 Scores 02/02/2021  PHQ - 2 Score 0    Fall Risk Fall Risk  02/02/2021  Falls in the past year? 0  Number falls in past yr: 0  Injury with Fall? 0  Follow up Falls evaluation completed;Falls prevention discussed    FALL RISK PREVENTION PERTAINING TO THE HOME:  Any stairs in or around the home? Yes  If so, are there any without handrails? No  Home free of loose throw rugs in walkways, pet beds, electrical cords, etc? Yes  Adequate lighting in your home to reduce risk of falls? Yes   ASSISTIVE DEVICES UTILIZED TO PREVENT FALLS:  Life alert? No  Use of a cane, walker or w/c? No  Grab bars in the bathroom? Yes  Shower chair or bench in shower? Yes  Elevated toilet seat or  a handicapped  toilet? Yes   TIMED UP AND GO:  Was the test performed? No .    Cognitive Function:  Normal cognitive status assessed by direct observation by this Nurse Health Advisor. No abnormalities found.          Immunizations Immunization History  Administered Date(s) Administered   Influenza Split 01/30/2014   Influenza, High Dose Seasonal PF 04/06/2016, 02/26/2017, 03/28/2018, 04/14/2019   Influenza,inj,Quad PF,6+ Mos 01/30/2014   Influenza,inj,quad, With Preservative 05/05/2015   Janssen (J&J) SARS-COV-2 Vaccination 09/17/2019   Pneumococcal Conjugate-13 10/25/2015, 11/20/2016   Pneumococcal Polysaccharide-23 03/28/2018   Tdap 07/05/2007, 12/02/2019   Zoster, Live 03/05/2014    TDAP status: Up to date  Flu Vaccine status: Due, Education has been provided regarding the importance of this vaccine. Advised may receive this vaccine at local pharmacy or Health Dept. Aware to provide a copy of the vaccination record if obtained from local pharmacy or Health Dept. Verbalized acceptance and understanding.  Pneumococcal vaccine status: Up to date  Covid-19 vaccine status: Information provided on how to obtain vaccines.   Qualifies for Shingles Vaccine? Yes   Zostavax completed Yes   Shingrix Completed?: No.    Education has been provided regarding the importance of this vaccine. Patient has been advised to call insurance company to determine out of pocket expense if they have not yet received this vaccine. Advised may also receive vaccine at local pharmacy or Health Dept. Verbalized acceptance and understanding.  Screening Tests Health Maintenance  Topic Date Due   Zoster Vaccines- Shingrix (1 of 2) Never done   COVID-19 Vaccine (2 - Booster for Janssen series) 11/12/2019   INFLUENZA VACCINE  01/10/2021   COLONOSCOPY (Pts 45-17yr Insurance coverage will need to be confirmed)  04/27/2025   TETANUS/TDAP  12/01/2029   Hepatitis C Screening  Completed   PNA vac Low Risk Adult   Completed   HPV VACCINES  Aged Out    Health Maintenance  Health Maintenance Due  Topic Date Due   Zoster Vaccines- Shingrix (1 of 2) Never done   COVID-19 Vaccine (2 - Booster for Janssen series) 11/12/2019   INFLUENZA VACCINE  01/10/2021    Colorectal cancer screening: Type of screening: Colonoscopy. Completed 2021. Repeat every 5 years  Lung Cancer Screening: (Low Dose CT Chest recommended if Age 70-80years, 30 pack-year currently smoking OR have quit w/in 15years.) does not qualify.   Lung Cancer Screening Referral:   Additional Screening:  Hepatitis C Screening: does not qualify; Completed 2016  Vision Screening: Recommended annual ophthalmology exams for early detection of glaucoma and other disorders of the eye. Is the patient up to date with their annual eye exam?  Yes  Who is the provider or what is the name of the office in which the patient attends annual eye exams? Dr. MSabra HeckIf pt is not established with a provider, would they like to be referred to a provider to establish care? No .   Dental Screening: Recommended annual dental exams for proper oral hygiene  Community Resource Referral / Chronic Care Management: CRR required this visit?  No   CCM required this visit?  No      Plan:     I have personally reviewed and noted the following in the patient's chart:   Medical and social history Use of alcohol, tobacco or illicit drugs  Current medications and supplements including opioid prescriptions. Patient is not currently taking opioid prescriptions. Functional ability and status Nutritional status Physical activity Advanced directives  List of other physicians Hospitalizations, surgeries, and ER visits in previous 12 months Vitals Screenings to include cognitive, depression, and falls Referrals and appointments  In addition, I have reviewed and discussed with patient certain preventive protocols, quality metrics, and best practice recommendations. A  written personalized care plan for preventive services as well as general preventive health recommendations were provided to patient.     Leroy Kennedy, LPN   [id number]   Nurse Notes: na

## 2021-02-05 ENCOUNTER — Telehealth: Payer: Self-pay

## 2021-02-05 NOTE — Telephone Encounter (Signed)
LVM for pt to CB to schedule AWV.

## 2021-02-27 ENCOUNTER — Other Ambulatory Visit: Payer: Self-pay | Admitting: Family Medicine

## 2021-03-11 ENCOUNTER — Other Ambulatory Visit: Payer: Self-pay

## 2021-03-11 ENCOUNTER — Encounter: Payer: Self-pay | Admitting: Family Medicine

## 2021-03-11 ENCOUNTER — Ambulatory Visit (INDEPENDENT_AMBULATORY_CARE_PROVIDER_SITE_OTHER): Payer: Medicare HMO | Admitting: Family Medicine

## 2021-03-11 VITALS — BP 133/79 | HR 68 | Temp 98.0°F | Ht 68.0 in | Wt 217.8 lb

## 2021-03-11 DIAGNOSIS — I10 Essential (primary) hypertension: Secondary | ICD-10-CM | POA: Diagnosis not present

## 2021-03-11 MED ORDER — LOSARTAN POTASSIUM 100 MG PO TABS: 100.0000 mg | ORAL_TABLET | Freq: Every day | ORAL | 3 refills | Status: DC

## 2021-03-11 NOTE — Progress Notes (Signed)
OFFICE VISIT  03/11/2021  CC:  Chief Complaint  Patient presents with   Follow-up    hypertension    HPI:    Patient is a 70 y.o. male who presents for 3 mo f/u HTN. A/P as of last visit: "HTN: well controlled now on lisinopril 30mg  qd but he has bothersome cough side effect from this med. Will change him over to losartan at the 100 mg qd dosing and he'll continue to follow bp/hr at home and we'll reconvene to review in 1 mo.   HLD: tolerating statin and chol levels have been stable longterm. Plan recheck lipids at next cpe approx 04/2021."  INTERIM HX: Feeling well. Driving to [facility] for work soon, then driving to [place] to visit his 49 y/o dad who is in [place].  BPs at home ranging 120s-140s over 60s-90s.  He consistently notes bp is up in mornings after drinking 2-4 cups of strong coffee.  BP more consistently in normal range when he drinks much less coffee.  He has no more cough since getting off ACE-I.  Past Medical History:  Diagnosis Date   Arthritis    heredity - hip dysplasia, OA   Dyshydrosis    Essential hypertension    onset 2020 per prior pcp record   Family history of colon cancer in mother    rpt colonoscopy due 2026   Family history of prostate cancer in father    Hay fever    History of blood transfusion    autologus   History of colon polyps    rpt colonoscopy due 2026   History of diverticulitis    severe diverticulosis in [place] and [place] on 2016 and 2021 colonoscopies   Hyperlipidemia    Mild intermittent asthma    rare use of ventolin inhaler   Obesity, Class I, BMI 30-34.9    Recurrent low back pain    +SI jt per pt (Murphy/Wainer)    Past Surgical History:  Procedure Laterality Date   [place]  07/31/2003   LV norm. EF58%,norm coronaries.   CHOLECYSTECTOMY  2016   COLONOSCOPY     multiple (+FH CC and personal hx of polyps). 04/27/20 no polyps, recall 5 yrs ([facility], Dr. Andres Shad)    [place]   bilateral hip replacement   left knee surgery  2010   NASAL SINUS SURGERY     NM MYOCAR PERF WALL MOTION  03/07/2012   EF 57%  low risk scan   NM MYOCAR PERF WALL MOTION  10/22/2006   EF 53%    NM MYOCAR PERF WALL MOTION  07/09/2003   MILD ANTERIOR wall thinning w/poss. superimposed anterolateral wall isch .-midventricular apex distrib. of mid to distal LAD   REVISION TOTAL HIP ARTHROPLASTY     right - revision- 03/2012   TOTAL HIP REVISION  03/27/2012   Procedure: TOTAL HIP REVISION;  Surgeon: Ninetta Lights, MD;  Location: [place];  Service: Orthopedics;  Laterality: Right;  with left knee cortisone injection   TOTAL HIP REVISION  05/15/2012   Procedure: TOTAL HIP REVISION;  Surgeon: Ninetta Lights, MD;  Location: [place];  Service: Orthopedics;  Laterality: Left;   TOTAL KNEE ARTHROPLASTY Left 01/15/2013   Procedure: TOTAL KNEE ARTHROPLASTY- left ;  Surgeon: Ninetta Lights, MD;  Location: [place];  Service: Orthopedics;  Laterality: Left;   VENTRAL HERNIA REPAIR  2018   mesh insertion; Surgeron:  Brooks Sailors, MD; Location: [facility]; Service;General; Laterality; N/A;   WISDOM TOOTH EXTRACTION      Outpatient Medications Prior to Visit  Medication Sig Dispense Refill   atorvastatin (LIPITOR) 40 MG tablet Take 40 mg by mouth at bedtime.      gabapentin (NEURONTIN) 300 MG capsule Take 1 capsule by mouth at bedtime.     losartan (COZAAR) 100 MG tablet TAKE 1 TABLET BY MOUTH EVERY DAY 14 tablet 0   albuterol (PROVENTIL HFA;VENTOLIN HFA) 108 (90 Base) MCG/ACT inhaler Inhale 1-2 puffs into the lungs every 6 (six) hours as needed for wheezing or shortness of breath. (Patient not taking: No sig reported) 1 Inhaler 0   diclofenac (VOLTAREN) 75 MG EC tablet Take 75 mg by mouth 2 (two) times daily as needed. (Patient not taking: No sig reported)     hydrOXYzine (ATARAX/VISTARIL) 25 MG tablet Take 25-50 mg by mouth at bedtime as needed.  (Patient not taking: No sig reported)     ketoconazole (NIZORAL) 2 % shampoo Apply topically 2 (two) times a week. (Patient not taking: No sig reported)     levocetirizine (XYZAL) 5 MG tablet Take 5 mg by mouth daily as needed. (Patient not taking: No sig reported)     No facility-administered medications prior to visit.    Allergies  Allergen Reactions   Sulfa Antibiotics     Childhood allergy   Sulfasalazine Other (See Comments)    unknown   Lisinopril Cough    ROS As per HPI  PE: Vitals with BMI 03/11/2021 02/02/2021 12/08/2020  Height 5\' 8"  (No Data) 5\' 8"   Weight 217 lbs 13 oz (No Data) 213 lbs 3 oz  BMI 71.69 - 67.89  Systolic 381 (No Data) 017  Diastolic 79 (No Data) 82  Pulse 68 - 72   Gen: Alert, well appearing.  Patient is oriented to person, place, time, and situation. AFFECT: pleasant, lucid thought and speech. CV: RRR, no m/r/g.   LUNGS: CTA bilat, nonlabored resps, good aeration in all lung fields. EXT: no clubbing or cyanosis.  no edema.    LABS:    Chemistry      Component Value Date/Time   NA 138 07/01/2020 0000   K 4.9 07/01/2020 0000   CL 100 07/01/2020 0000   CO2 31 (A) 07/01/2020 0000   BUN 11 07/01/2020 0000   CREATININE 0.8 07/01/2020 0000   CREATININE 0.92 01/03/2013 1453   GLU 87 07/01/2020 0000      Component Value Date/Time   CALCIUM 97.0 (A) 07/01/2020 0000   ALKPHOS 77 07/01/2020 0000   AST 24 07/01/2020 0000   ALT 38 07/01/2020 0000   BILITOT 0.3 01/03/2013 1453     Lab Results  Component Value Date   CHOL 162 04/29/2020   HDL 38 04/29/2020   LDLCALC 99 04/29/2020   TRIG 176 (A) 04/29/2020   Lab Results  Component Value Date   WBC 7.3 01/16/2013   HGB 10.9 (L) 01/16/2013   HCT 31.6 (L) 01/16/2013   MCV 81.9 01/16/2013   PLT 212 01/16/2013   IMPRESSION AND PLAN:  1) HTN: control is fine.  He will cut back on coffee more consistently and also check some bp's late in the day.  Cont losartan 100 qd.  An After Visit  Summary was printed and given to the patient.  FOLLOW UP: Return for 6-8 wks f/u for fasting CPE.  Signed:  Crissie Sickles, MD  03/11/2021  

## 2021-04-27 ENCOUNTER — Encounter: Payer: Self-pay | Admitting: Family Medicine

## 2021-04-27 ENCOUNTER — Ambulatory Visit (INDEPENDENT_AMBULATORY_CARE_PROVIDER_SITE_OTHER): Payer: Medicare HMO | Admitting: Family Medicine

## 2021-04-27 ENCOUNTER — Other Ambulatory Visit: Payer: Self-pay

## 2021-04-27 VITALS — BP 134/83 | HR 74 | Temp 97.6°F | Ht 69.0 in | Wt 214.4 lb

## 2021-04-27 DIAGNOSIS — I10 Essential (primary) hypertension: Secondary | ICD-10-CM | POA: Diagnosis not present

## 2021-04-27 DIAGNOSIS — Z0000 Encounter for general adult medical examination without abnormal findings: Secondary | ICD-10-CM

## 2021-04-27 DIAGNOSIS — Z125 Encounter for screening for malignant neoplasm of prostate: Secondary | ICD-10-CM | POA: Diagnosis not present

## 2021-04-27 DIAGNOSIS — Z23 Encounter for immunization: Secondary | ICD-10-CM | POA: Diagnosis not present

## 2021-04-27 DIAGNOSIS — E782 Mixed hyperlipidemia: Secondary | ICD-10-CM

## 2021-04-27 LAB — PSA, MEDICARE: PSA: 2 ng/ml (ref 0.10–4.00)

## 2021-04-27 MED ORDER — ALBUTEROL SULFATE HFA 108 (90 BASE) MCG/ACT IN AERS: 1.0000 | INHALATION_SPRAY | Freq: Four times a day (QID) | RESPIRATORY_TRACT | 0 refills | Status: DC | PRN

## 2021-04-27 MED ORDER — LOSARTAN POTASSIUM 100 MG PO TABS: 100.0000 mg | ORAL_TABLET | Freq: Every day | ORAL | 3 refills | Status: DC

## 2021-04-27 MED ORDER — ZOSTER VAC RECOMB ADJUVANTED 50 MCG/0.5ML IM SUSR: 0.5000 mL | Freq: Once | INTRAMUSCULAR | 0 refills | Status: AC

## 2021-04-27 MED ORDER — ATORVASTATIN CALCIUM 40 MG PO TABS: 40.0000 mg | ORAL_TABLET | Freq: Every day | ORAL | 3 refills | Status: DC

## 2021-04-27 NOTE — Progress Notes (Signed)
Office Note 04/27/2021  CC:  Chief Complaint  Patient presents with   Annual Exam    Pt is not fasting    HPI:  Patient is a 70 y.o. male who is here for annual health maintenance exam and f/u HTN and HLD. I last saw him 03/11/21. A/P as of that visit: "1) HTN: control is fine.  He will cut back on coffee more consistently and also check some bp's late in the day.  Cont losartan 100 qd."  INTERIM HX: He feels well, plays golf some and does elliptical most days of the week. Plans a month-long trip to [place] next year.  Works from home and enjoys this.  Home blood pressure is reviewed and they average in the 130s over 80s, occasional excursion of systolic into the 370W and pretty commonly can be in the 88Q diastolic.  He does note blood pressure up more when stressed.  Gets ongoing management for his right lumbosacral pain with Dr. Ron Agee with [facility].  Gets a cortisone injection on November 28.  There is plan for nerve ablation procedure sometime in the next half year or so.  He takes diclofenac and gabapentin on average of twice a week.  Past Medical History:  Diagnosis Date   Arthritis    heredity - hip dysplasia, OA   Dyshydrosis    Essential hypertension    onset 2020 per prior pcp record   Family history of colon cancer in mother    rpt colonoscopy due 2026   Family history of prostate cancer in father    Hay fever    History of blood transfusion    autologus   History of colon polyps    rpt colonoscopy due 2026   History of diverticulitis    severe diverticulosis in [place] and [place] on 2016 and 2021 colonoscopies   Hyperlipidemia    Mild intermittent asthma    rare use of ventolin inhaler   Obesity, Class I, BMI 30-34.9    Recurrent low back pain    +SI jt per pt (Murphy/Wainer)    Past Surgical History:  Procedure Laterality Date   [place]  07/31/2003   LV norm. EF58%,norm coronaries.    CHOLECYSTECTOMY  2016   COLONOSCOPY     multiple (+FH CC and personal hx of polyps). 04/27/20 no polyps, recall 5 yrs ([facility], Dr. Andres Shad)   [place]   bilateral hip replacement   left knee surgery  2010   NASAL SINUS SURGERY     NM MYOCAR PERF WALL MOTION  03/07/2012   EF 57%  low risk scan   NM MYOCAR PERF WALL MOTION  10/22/2006   EF 53%    NM MYOCAR PERF WALL MOTION  07/09/2003   MILD ANTERIOR wall thinning w/poss. superimposed anterolateral wall isch .-midventricular apex distrib. of mid to distal LAD   REVISION TOTAL HIP ARTHROPLASTY     right - revision- 03/2012   TOTAL HIP REVISION  03/27/2012   Procedure: TOTAL HIP REVISION;  Surgeon: Ninetta Lights, MD;  Location: [facility];  Service: Orthopedics;  Laterality: Right;  with left knee cortisone injection   TOTAL HIP REVISION  05/15/2012   Procedure: TOTAL HIP REVISION;  Surgeon: Ninetta Lights, MD;  Location: [facility];  Service: Orthopedics;  Laterality: Left;   TOTAL KNEE ARTHROPLASTY Left 01/15/2013   Procedure: TOTAL KNEE ARTHROPLASTY- left ;  Surgeon: Nestor Ramp  Percell Miller, MD;  Location: [facility];  Service: Orthopedics;  Laterality: Left;   VENTRAL HERNIA REPAIR  2018   mesh insertion; Surgeron: F Patrica Duel, MD; Location: [facility]; Service;General; Laterality; N/A;   WISDOM TOOTH EXTRACTION      Family History  Problem Relation Age of Onset   Colon cancer Mother 79   Hyperlipidemia Mother    Prostate cancer Father    Leukemia Sister 4   Stroke Maternal Grandfather    Throat cancer Paternal Grandfather    Diabetes Brother     Social History   Socioeconomic History   Marital status: Married    Spouse name: Not on file   Number of children: Not on file   Years of education: Not on file   Highest education level: Not on file  Occupational History   Not on file  Tobacco Use   Smoking status: Never   Smokeless tobacco: Never  Vaping Use   Vaping Use: Never used  Substance  and Sexual Activity   Alcohol use: No    Alcohol/week: 2.0 standard drinks    Types: 2 Cans of beer per week    Comment: occasional wine or beer   Drug use: No   Sexual activity: Yes    Partners: Female    Comment: married  Other Topics Concern   Not on file  Social History Narrative   Married, +daughters.   Orig from [place], got PhD in [employer] at [place] in [place] in 1981.   Occup: Psychologist, educational.   Educ: PhD   No T/A/D   Enjoys golf.   Social Determinants of Health   Financial Resource Strain: Low Risk    Difficulty of Paying Living Expenses: Not hard at all  Food Insecurity: No Food Insecurity   Worried About Charity fundraiser in the Last Year: Never true   [place] in the Last Year: Never true  Transportation Needs: No Transportation Needs   Lack of Transportation (Medical): No   Lack of Transportation (Non-Medical): No  Physical Activity: Sufficiently Active   Days of Exercise per Week: 4 days   Minutes of Exercise per Session: 40 min  Stress: No Stress Concern Present   Feeling of Stress : Only a little  Social Connections: Engineer, building services of Communication with Friends and Family: More than three times a week   Frequency of Social Gatherings with Friends and Family: More than three times a week   Attends Religious Services: More than 4 times per year   Active Member of [employer] or Organizations: Yes   Attends Music therapist: More than 4 times per year   Marital Status: Married  Human resources officer Violence: Not At Risk   Fear of Current or Ex-Partner: No   Emotionally Abused: No   Physically Abused: No   Sexually Abused: No    Outpatient Medications Prior to Visit  Medication Sig Dispense Refill   gabapentin (NEURONTIN) 300 MG capsule Take 1 capsule by mouth at bedtime.     GLUCOSAMINE-CHONDROITIN PO Take by mouth daily.     levocetirizine (XYZAL) 5 MG tablet Take 5 mg by mouth every  evening.     atorvastatin (LIPITOR) 40 MG tablet Take 40 mg by mouth at bedtime.      losartan (COZAAR) 100 MG tablet Take 1 tablet (100 mg total) by mouth daily. 90 tablet 3   albuterol (PROVENTIL HFA;VENTOLIN HFA) 108 (90 Base) MCG/ACT inhaler  Inhale 1-2 puffs into the lungs every 6 (six) hours as needed for wheezing or shortness of breath. (Patient not taking: No sig reported) 1 Inhaler 0   No facility-administered medications prior to visit.    Allergies  Allergen Reactions   Sulfa Antibiotics     Childhood allergy   Sulfasalazine Other (See Comments)    unknown   Lisinopril Cough    ROS Review of Systems  Constitutional:  Negative for appetite change, chills, fatigue and fever.  HENT:  Negative for congestion, dental problem, ear pain and sore throat.   Eyes:  Negative for discharge, redness and visual disturbance.  Respiratory:  Negative for cough, chest tightness, shortness of breath and wheezing.   Cardiovascular:  Negative for chest pain, palpitations and leg swelling.  Gastrointestinal:  Negative for abdominal pain, blood in stool, diarrhea, nausea and vomiting.  Genitourinary:  Negative for difficulty urinating, dysuria, flank pain, frequency, hematuria and urgency.  Musculoskeletal:  Positive for back pain (as per hpi). Negative for arthralgias, joint swelling, myalgias and neck stiffness.  Skin:  Negative for pallor and rash.  Neurological:  Negative for dizziness, speech difficulty, weakness and headaches.  Hematological:  Negative for adenopathy. Does not bruise/bleed easily.  Psychiatric/Behavioral:  Negative for confusion and sleep disturbance. The patient is not nervous/anxious.    PE; Vitals with BMI 04/27/2021 03/11/2021 02/02/2021  Height 5\' 9"  5\' 8"  (No Data)  Weight 214 lbs 6 oz 217 lbs 13 oz (No Data)  BMI 60.73 71.06 -  Systolic 269 485 (No Data)  Diastolic 83 79 (No Data)  Pulse 74 68 -     Gen: Alert, well appearing.  Patient is oriented to person,  place, time, and situation. AFFECT: pleasant, lucid thought and speech. ENT: Ears: EACs clear, normal epithelium.  TMs with good light reflex and landmarks bilaterally.  Eyes: no injection, icteris, swelling, or exudate.  EOMI, PERRLA. Nose: no drainage or turbinate edema/swelling.  No injection or focal lesion.  Mouth: lips without lesion/swelling.  Oral mucosa pink and moist.  Dentition intact and without obvious caries or gingival swelling.  Oropharynx without erythema, exudate, or swelling.  Neck: supple/nontender.  No LAD, mass, or TM.  Carotid pulses 2+ bilaterally, without bruits. CV: RRR, no m/r/g.   LUNGS: CTA bilat, nonlabored resps, good aeration in all lung fields. ABD: soft, NT, ND, BS normal.  No hepatospenomegaly or mass.  No bruits. EXT: no clubbing, cyanosis, or edema.  Musculoskeletal: no joint swelling, erythema, warmth, or tenderness.  ROM of all joints intact. Skin - no sores or suspicious lesions or rashes or color changes  Pertinent labs:  Lab Results  Component Value Date   TSH 2.39 05/28/2007   Lab Results  Component Value Date   WBC 7.3 01/16/2013   HGB 10.9 (L) 01/16/2013   HCT 31.6 (L) 01/16/2013   MCV 81.9 01/16/2013   PLT 212 01/16/2013   Lab Results  Component Value Date   CREATININE 0.8 07/01/2020   BUN 11 07/01/2020   NA 138 07/01/2020   K 4.9 07/01/2020   CL 100 07/01/2020   CO2 31 (A) 07/01/2020   Lab Results  Component Value Date   ALT 38 07/01/2020   AST 24 07/01/2020   ALKPHOS 77 07/01/2020   BILITOT 0.3 01/03/2013   Lab Results  Component Value Date   CHOL 162 04/29/2020   Lab Results  Component Value Date   HDL 38 04/29/2020   Lab Results  Component Value Date   LDLCALC  99 04/29/2020   Lab Results  Component Value Date   TRIG 176 (A) 04/29/2020   No results found for: CHOLHDL Lab Results  Component Value Date   PSA 1.98 04/29/2020   PSA 2.08 04/23/2019   PSA 2.95 03/22/2018   ASSESSMENT AND PLAN:   1) HTN: Blood  pressure average still mildly high but he wants to hold off on any additional medication at this time.  Continue losartan 100 mg a day.  To home monitoring and call if consistently greater than 140/90. Lytes/cr today.  2) HLD: Tolerating atorvastatin 40 mg a day.  He last ate about 5 hours ago: Serial with almond milk and coffee with creamer. Lipid and hepatic panel today.  3) Health maintenance exam: Reviewed age and gender appropriate health maintenance issues (prudent diet, regular exercise, health risks of tobacco and excessive alcohol, use of seatbelts, fire alarms in home, use of sunscreen).  Also reviewed age and gender appropriate health screening as well as vaccine recommendations. Vaccines: Shingrix->rx to pharmacy.  Flu->given today.  Otherwise ALL UTD. Labs: cbc, cmet, flp, PSA. Prostate ca screening: FH prostate cancer->PSA. Colon ca screening: FH colon cancer-->recall 04/2025.  An After Visit Summary was printed and given to the patient.  FOLLOW UP:  Return in about 6 months (around 10/25/2021) for routine chronic illness f/u.  Signed:  Crissie Sickles, MD           04/27/2021

## 2021-04-27 NOTE — Patient Instructions (Signed)

## 2021-04-28 LAB — CBC WITH DIFFERENTIAL/PLATELET
Absolute Monocytes: 710 cells/uL (ref 200–950)
Basophils Absolute: 60 cells/uL (ref 0–200)
Basophils Relative: 0.9 %
Eosinophils Absolute: 141 cells/uL (ref 15–500)
Eosinophils Relative: 2.1 %
HCT: 44.1 % (ref 38.5–50.0)
Hemoglobin: 15 g/dL (ref 13.2–17.1)
Lymphs Abs: 1648 cells/uL (ref 850–3900)
MCH: 29.8 pg (ref 27.0–33.0)
MCHC: 34 g/dL (ref 32.0–36.0)
MCV: 87.7 fL (ref 80.0–100.0)
MPV: 10.7 fL (ref 7.5–12.5)
Monocytes Relative: 10.6 %
Neutro Abs: 4141 cells/uL (ref 1500–7800)
Neutrophils Relative %: 61.8 %
Platelets: 296 10*3/uL (ref 140–400)
RBC: 5.03 10*6/uL (ref 4.20–5.80)
RDW: 13.4 % (ref 11.0–15.0)
Total Lymphocyte: 24.6 %
WBC: 6.7 10*3/uL (ref 3.8–10.8)

## 2021-04-28 LAB — COMPREHENSIVE METABOLIC PANEL
AG Ratio: 1.6 (calc) (ref 1.0–2.5)
ALT: 28 U/L (ref 9–46)
AST: 23 U/L (ref 10–35)
Albumin: 4.1 g/dL (ref 3.6–5.1)
Alkaline phosphatase (APISO): 87 U/L (ref 35–144)
BUN: 12 mg/dL (ref 7–25)
CO2: 28 mmol/L (ref 20–32)
Calcium: 9.3 mg/dL (ref 8.6–10.3)
Chloride: 99 mmol/L (ref 98–110)
Creat: 0.78 mg/dL (ref 0.70–1.28)
Globulin: 2.6 g/dL (calc) (ref 1.9–3.7)
Glucose, Bld: 81 mg/dL (ref 65–99)
Potassium: 4.5 mmol/L (ref 3.5–5.3)
Sodium: 136 mmol/L (ref 135–146)
Total Bilirubin: 0.6 mg/dL (ref 0.2–1.2)
Total Protein: 6.7 g/dL (ref 6.1–8.1)

## 2021-04-28 LAB — LIPID PANEL
Cholesterol: 174 mg/dL (ref <200)
HDL: 38 mg/dL — ABNORMAL LOW (ref >=40)
LDL Cholesterol (Calc): 99 mg/dL (calc)
Non-HDL Cholesterol (Calc): 136 mg/dL (calc) — ABNORMAL HIGH (ref <130)
Total CHOL/HDL Ratio: 4.6 (calc) (ref <5.0)
Triglycerides: 242 mg/dL — ABNORMAL HIGH (ref <150)

## 2021-06-10 ENCOUNTER — Encounter: Payer: Self-pay | Admitting: Family Medicine

## 2021-06-10 ENCOUNTER — Ambulatory Visit (INDEPENDENT_AMBULATORY_CARE_PROVIDER_SITE_OTHER): Payer: Medicare HMO | Admitting: Family Medicine

## 2021-06-10 ENCOUNTER — Other Ambulatory Visit: Payer: Self-pay

## 2021-06-10 VITALS — BP 157/94 | HR 70 | Temp 97.9°F | Wt 211.8 lb

## 2021-06-10 DIAGNOSIS — J4521 Mild intermittent asthma with (acute) exacerbation: Secondary | ICD-10-CM

## 2021-06-10 DIAGNOSIS — J069 Acute upper respiratory infection, unspecified: Secondary | ICD-10-CM

## 2021-06-10 MED ORDER — ALBUTEROL SULFATE HFA 108 (90 BASE) MCG/ACT IN AERS: 1.0000 | INHALATION_SPRAY | Freq: Four times a day (QID) | RESPIRATORY_TRACT | 0 refills | Status: DC | PRN

## 2021-06-10 MED ORDER — PREDNISONE 20 MG PO TABS: ORAL_TABLET | ORAL | 0 refills | Status: DC

## 2021-06-10 MED ORDER — DOXYCYCLINE HYCLATE 100 MG PO CAPS: 100.0000 mg | ORAL_CAPSULE | Freq: Two times a day (BID) | ORAL | 0 refills | Status: AC

## 2021-06-10 NOTE — Progress Notes (Signed)
OFFICE VISIT  06/10/2021  CC:  Chief Complaint  Patient presents with   Cough   Nasal Congestion   HPI:    Patient is a 70 y.o. male who presents for respiratory symptoms.  INTERIM HX: Robert [name] has had 1 week of nasal congestion, cough, mild headache and sore throat, and fatigue.  His chest has felt tight in the evenings and he has used his albuterol each night and has gotten improvement with this.  No fevers or shortness of breath or audible wheezing.  Home COVID test 4 nights ago negative  Past Medical History:  Diagnosis Date   Arthritis    heredity - hip dysplasia, OA   Dyshydrosis    Essential hypertension    onset 2020 per prior pcp record   Family history of colon cancer in mother    rpt colonoscopy due 2026   Family history of prostate cancer in father    Hay fever    History of blood transfusion    autologus   History of colon polyps    rpt colonoscopy due 2026   History of diverticulitis    severe diverticulosis in [place] and [place] on 2016 and 2021 colonoscopies   Hyperlipidemia    Mild intermittent asthma    rare use of ventolin inhaler   Obesity, Class I, BMI 30-34.9    Recurrent low back pain    +SI jt per pt (Murphy/Wainer)    Past Surgical History:  Procedure Laterality Date   [place]  07/31/2003   LV norm. EF58%,norm coronaries.   CHOLECYSTECTOMY  2016   COLONOSCOPY     multiple (+FH CC and personal hx of polyps). 04/27/20 no polyps, recall 5 yrs ([facility], Dr. Andres Shad)   [place]   bilateral hip replacement   left knee surgery  2010   NASAL SINUS SURGERY     NM MYOCAR PERF WALL MOTION  03/07/2012   EF 57%  low risk scan   NM MYOCAR PERF WALL MOTION  10/22/2006   EF 53%    NM MYOCAR PERF WALL MOTION  07/09/2003   MILD ANTERIOR wall thinning w/poss. superimposed anterolateral wall isch .-midventricular apex distrib. of mid to distal LAD   REVISION TOTAL HIP ARTHROPLASTY     right  - revision- 03/2012   TOTAL HIP REVISION  03/27/2012   Procedure: TOTAL HIP REVISION;  Surgeon: Ninetta Lights, MD;  Location: [facility];  Service: Orthopedics;  Laterality: Right;  with left knee cortisone injection   TOTAL HIP REVISION  05/15/2012   Procedure: TOTAL HIP REVISION;  Surgeon: Ninetta Lights, MD;  Location: [facility];  Service: Orthopedics;  Laterality: Left;   TOTAL KNEE ARTHROPLASTY Left 01/15/2013   Procedure: TOTAL KNEE ARTHROPLASTY- left ;  Surgeon: Ninetta Lights, MD;  Location: [facility];  Service: Orthopedics;  Laterality: Left;   VENTRAL HERNIA REPAIR  2018   mesh insertion; Surgeron: F Patrica Duel, MD; Location: [facility]; Service;General; Laterality; N/A;   WISDOM TOOTH EXTRACTION      Outpatient Medications Prior to Visit  Medication Sig Dispense Refill   atorvastatin (LIPITOR) 40 MG tablet Take 1 tablet (40 mg total) by mouth at bedtime. 90 tablet 3   diclofenac (VOLTAREN) 75 MG EC tablet Take 75 mg by mouth 2 (two) times daily as needed.     gabapentin (NEURONTIN) 300 MG capsule Take 1 capsule by mouth at bedtime.  GLUCOSAMINE-CHONDROITIN PO Take by mouth daily.     levocetirizine (XYZAL) 5 MG tablet Take 5 mg by mouth every evening.     losartan (COZAAR) 100 MG tablet Take 1 tablet (100 mg total) by mouth daily. 90 tablet 3   albuterol (VENTOLIN HFA) 108 (90 Base) MCG/ACT inhaler Inhale 1-2 puffs into the lungs every 6 (six) hours as needed for wheezing or shortness of breath. 1 each 0   No facility-administered medications prior to visit.    Allergies  Allergen Reactions   Sulfa Antibiotics     Childhood allergy   Sulfasalazine Other (See Comments)    unknown   Lisinopril Cough    ROS As per HPI  PE: Vitals with BMI 06/10/2021 04/27/2021 03/11/2021  Height - 5\' 9"  5\' 8"   Weight 211 lbs 13 oz 214 lbs 6 oz 217 lbs 13 oz  BMI - 46.50 35.46  Systolic 568 127 517  Diastolic 94 83 79  Pulse 70 74 68     Physical Exam  Gen: alert, NAD,  NONTOXIC APPEARING. HEENT: eyes without injection, drainage, or swelling.  Ears: EACs clear, TMs with normal light reflex and landmarks.  Nose: Clear rhinorrhea, with some dried, crusty exudate adherent to mildly injected mucosa.  No purulent d/c.  No paranasal sinus TTP.  No facial swelling.  Throat and mouth without focal lesion.  No pharyngial swelling, erythema, or exudate.   Neck: supple, no LAD.   LUNGS: CTA bilat, nonlabored resps.   CV: RRR, no m/r/g. EXT: no c/c/e SKIN: no rash   LABS:  Last CBC Lab Results  Component Value Date   WBC 6.7 04/27/2021   HGB 15.0 04/27/2021   HCT 44.1 04/27/2021   MCV 87.7 04/27/2021   MCH 29.8 04/27/2021   RDW 13.4 04/27/2021   PLT 296 00/17/4944   Last metabolic panel Lab Results  Component Value Date   GLUCOSE 81 04/27/2021   NA 136 04/27/2021   K 4.5 04/27/2021   CL 99 04/27/2021   CO2 28 04/27/2021   BUN 12 04/27/2021   CREATININE 0.78 04/27/2021   GFRNONAA 62 05/21/2020   CALCIUM 9.3 04/27/2021   PROT 6.7 04/27/2021   ALBUMIN 4.1 04/29/2020   BILITOT 0.6 04/27/2021   ALKPHOS 77 07/01/2020   AST 23 04/27/2021   ALT 28 04/27/2021    IMPRESSION AND PLAN:  URI with cough and congestion, prolonged symptoms.   Signs of mild acute asthma flare as well. Prednisone 40 mg a day x5 days. Doxycycline 100 mg twice a day x7 days. Continue over-the-counter symptomatic care.  An After Visit Summary was printed and given to the patient.  FOLLOW UP: Return if symptoms worsen or fail to improve.  Signed:  Crissie Sickles, MD           06/10/2021

## 2021-07-01 ENCOUNTER — Other Ambulatory Visit: Payer: Self-pay | Admitting: Family Medicine

## 2021-08-12 HISTORY — PX: CATARACT EXTRACTION: SUR2

## 2021-08-26 HISTORY — PX: CATARACT EXTRACTION: SUR2

## 2021-09-15 IMAGING — CT CT BIOPSY
1 of 3 series · 12 of 32 positions shown, 18 images · non-contrast
Comparison: none

CLINICAL DATA: 70-year-old male with approximately 6 months of
right lower back pain localizing to the sacroiliac joint. He
presents for a diagnostic anesthetic injection under CT guidance.

[Series 2: needle -guided injection · axial · 0.82mm/px · z∈[-128,-40]mm · 12 of 52 slices shown, 18 images]
[im 4/52  soft-tissue]
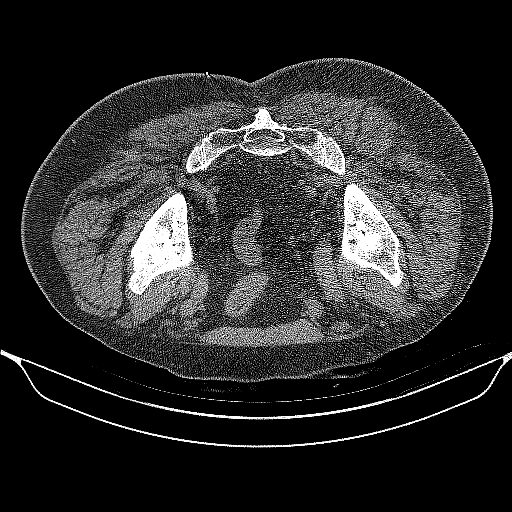
[im 4/52  bone]
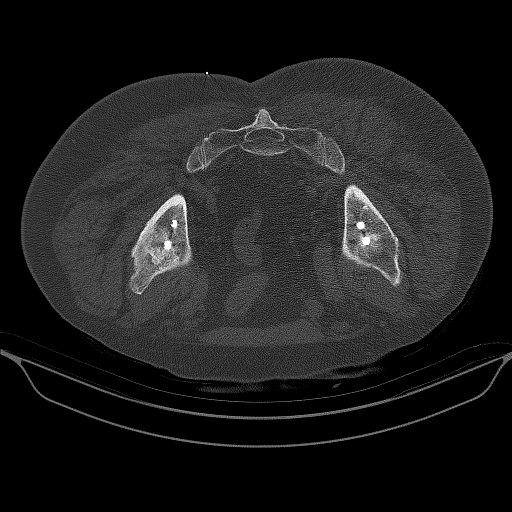
[im 8/52  soft-tissue]
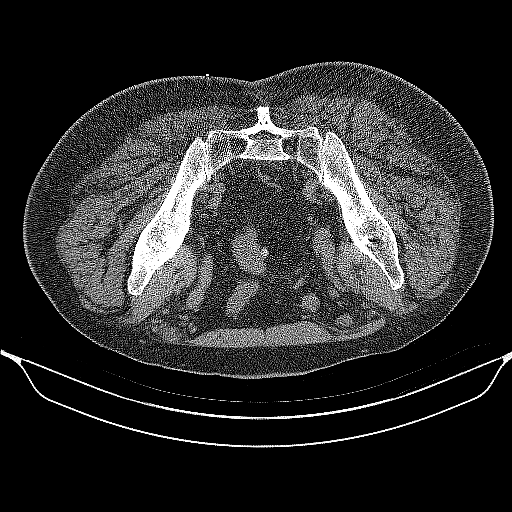
[im 12/52  soft-tissue]
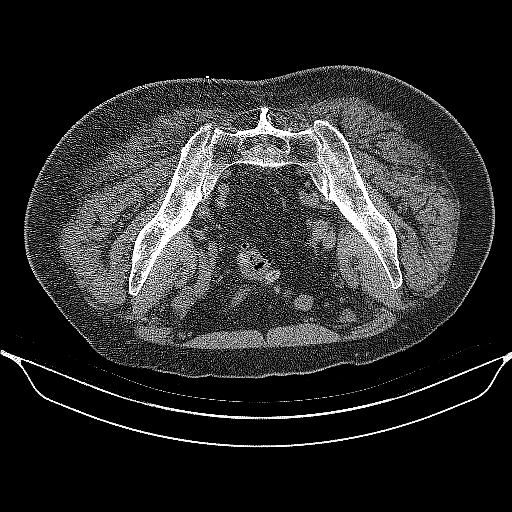
[im 16/52  soft-tissue]
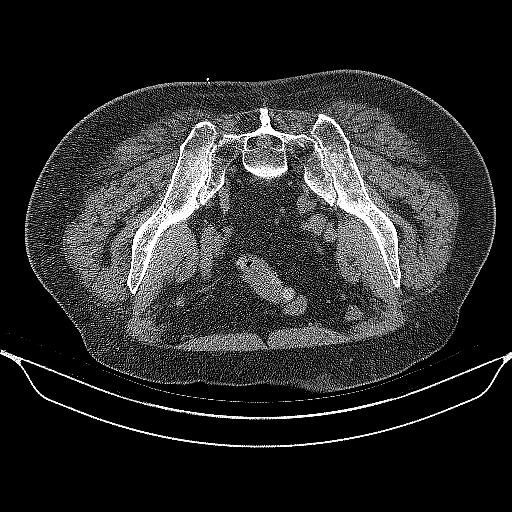
[im 20/52  soft-tissue]
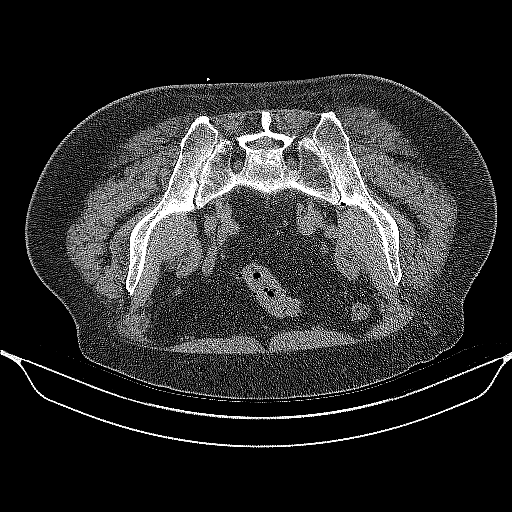
[im 24/52  soft-tissue]
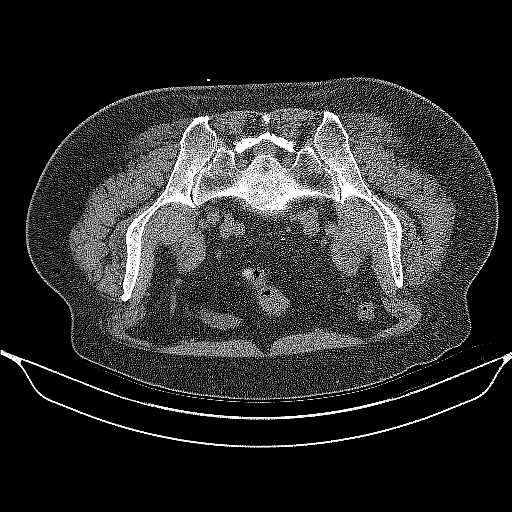
[im 28/52  soft-tissue]
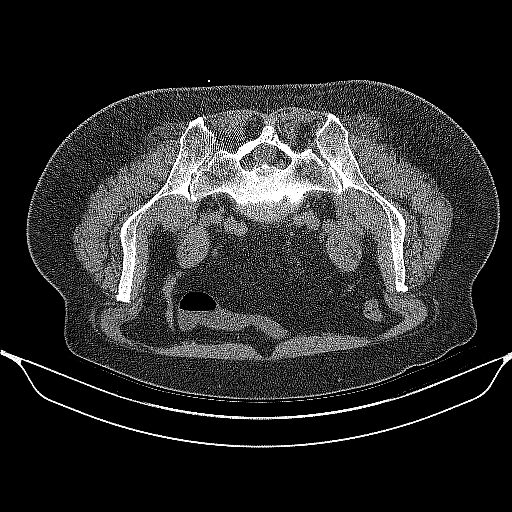
[im 32/52  soft-tissue]
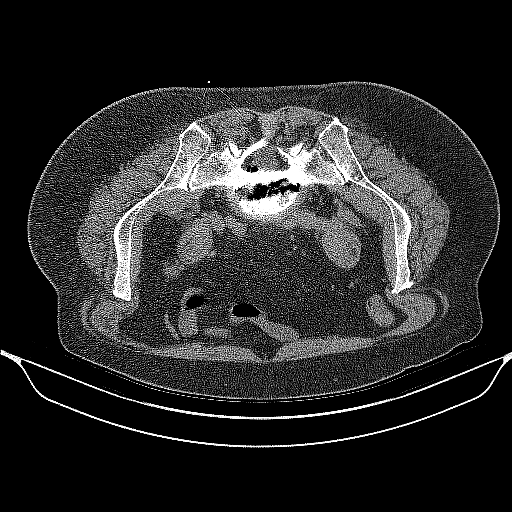
[im 36/52  soft-tissue]
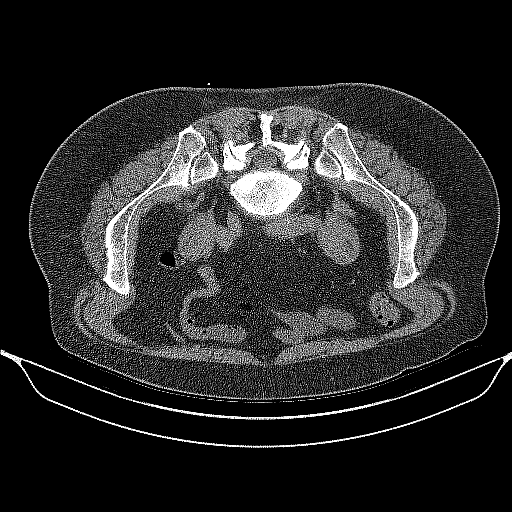
[im 36/52  lung]
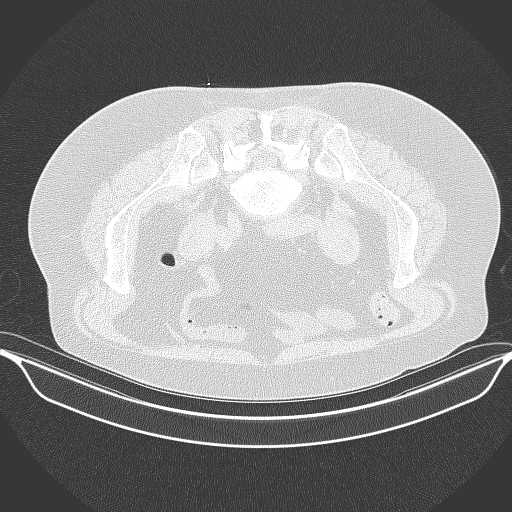
[im 36/52  bone]
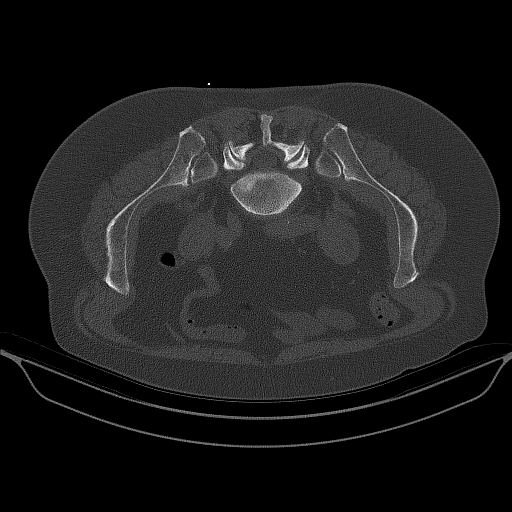
[im 40/52  soft-tissue]
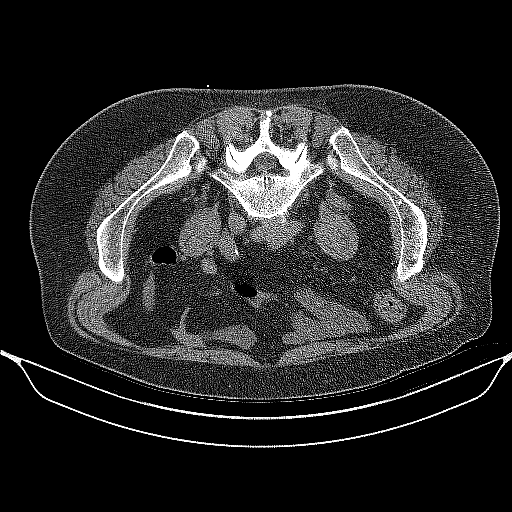
[im 40/52  lung]
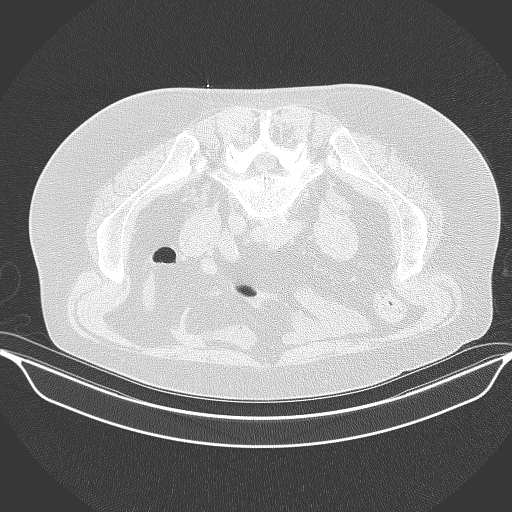
[im 44/52  soft-tissue]
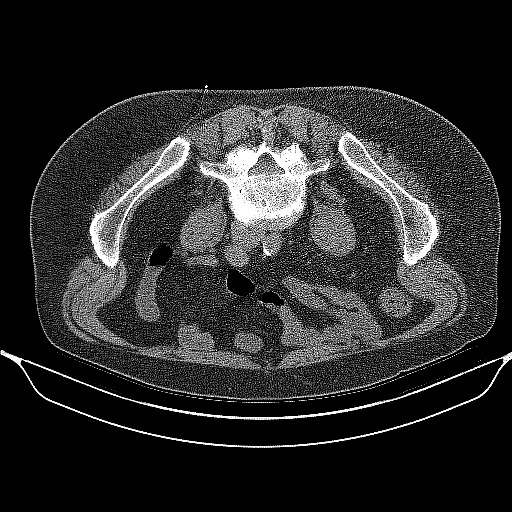
[im 44/52  lung]
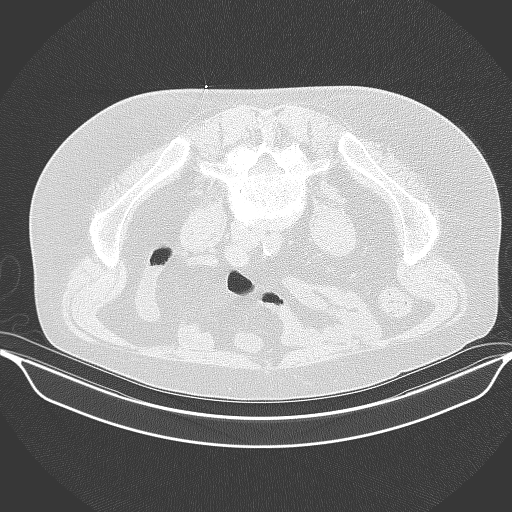
[im 48/52  soft-tissue]
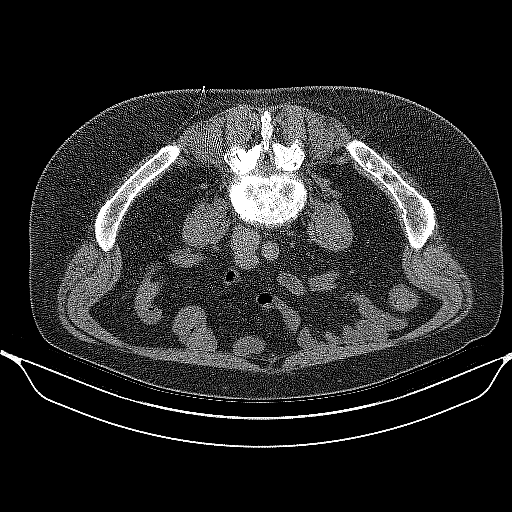
[im 48/52  lung]
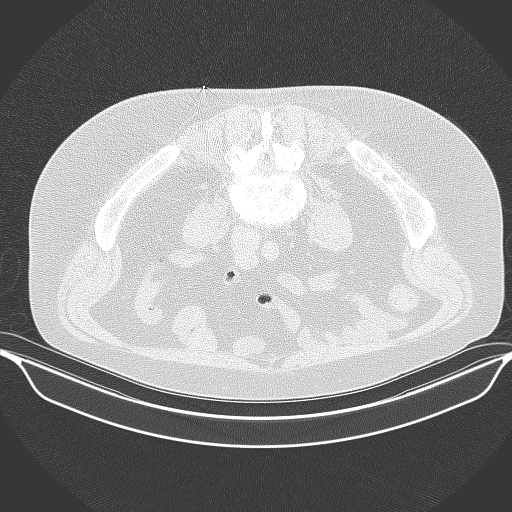

[12 of 32 positions shown; findings below may reference images not displayed]

EXAM:
RIGHT CT GUIDED SI JOINT INJECTION



After local anesthesia with 1% lidocaine without epinephrine and
subsequent deep anesthesia, a 22 GAUGE spinal needle was advanced
into the RIGHT SI joint under intermittent CT guidance.

Once the needle was in satisfactory position, representative image
was captured with the needle demonstrated in the sacroiliac joint. A
gentle hand injection of contrast material was then performed.
Subsequent CT imaging demonstrates appropriately localized contrast
within the right sacroiliac joint. Subsequently, 2 ML OF 0.5%
BUPIVACAINE was injected into the RIGHT SI joint. Needles removed
and a sterile dressing applied.

No complications were observed.
IMPRESSION: Successful CT-guided right SI joint injection.

## 2021-10-26 ENCOUNTER — Ambulatory Visit (INDEPENDENT_AMBULATORY_CARE_PROVIDER_SITE_OTHER): Payer: Medicare HMO | Admitting: Family Medicine

## 2021-10-26 ENCOUNTER — Encounter: Payer: Self-pay | Admitting: Family Medicine

## 2021-10-26 VITALS — BP 105/67 | HR 100 | Temp 97.8°F | Ht 69.0 in | Wt 210.6 lb

## 2021-10-26 DIAGNOSIS — E782 Mixed hyperlipidemia: Secondary | ICD-10-CM | POA: Diagnosis not present

## 2021-10-26 DIAGNOSIS — I10 Essential (primary) hypertension: Secondary | ICD-10-CM | POA: Diagnosis not present

## 2021-10-26 MED ORDER — FLUTICASONE PROPIONATE 0.05 % EX CREA: TOPICAL_CREAM | Freq: Two times a day (BID) | CUTANEOUS | 0 refills | Status: DC

## 2021-10-26 NOTE — Patient Instructions (Signed)
Try otc antihistamine eye drops: zaditor (generic ok) ?

## 2021-10-26 NOTE — Progress Notes (Signed)
OFFICE VISIT ? ?10/26/2021 ? ?CC:  ?Chief Complaint  ?Patient presents with  ? Hypertension  ? Hyperlipidemia  ?  Pt is not fasting  ? ? ?Patient is a 71 y.o. male who presents for 6 mo f/u HTN and HLD. ?A/P as of last visit: ?"1) HTN: Blood pressure average still mildly high but he wants to hold off on any additional medication at this time.  Continue losartan 100 mg a day.  To home monitoring and call if consistently greater than 140/90. ?Lytes/cr today. ?  ?2) HLD: Tolerating atorvastatin 40 mg a day.  He last ate about 5 hours ago: Serial with almond milk and coffee with creamer. ?Lipid and hepatic panel today. ?  ?3) Health maintenance exam: ?Reviewed age and gender appropriate health maintenance issues (prudent diet, regular exercise, health risks of tobacco and excessive alcohol, use of seatbelts, fire alarms in home, use of sunscreen).  Also reviewed age and gender appropriate health screening as well as vaccine recommendations. ?Vaccines: Shingrix->rx to pharmacy.  Flu->given today.  Otherwise ALL UTD. ?Labs: cbc, cmet, flp, PSA. ?Prostate ca screening: FH prostate cancer->PSA. ?Colon ca screening: FH colon cancer-->recall 04/2025." ? ?INTERIM HX: ?Doing very well. 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 ?Exercises regularly. ? ?Home bps <130/80. ? ?ROS as above, plus--> no fevers, no CP, no SOB, no wheezing, no cough, no dizziness, no HAs, no rashes, no melena/hematochezia.  No polyuria or polydipsia.  No myalgias or arthralgias.  No focal weakness, paresthesias, or tremors.  No acute vision or hearing abnormalities.  No dysuria or unusual/new urinary urgency or frequency.  No recent changes in lower legs. ?No n/v/d or abd pain.  No palpitations.   ? ? ?Past Medical History:  ?Diagnosis Date  ? Arthritis   ? heredity - hip dysplasia, OA  ? Dyshydrosis   ? Essential hypertension   ? onset 2020 per prior pcp record  ? Family history of colon cancer in mother    ? rpt colonoscopy due 2026  ? Family history of prostate cancer in father   ? Hay fever   ? History of blood transfusion   ? autologus  ? History of colon polyps   ? rpt colonoscopy due 2026  ? History of diverticulitis   ? severe diverticulosis in [place] and [place] on 2016 and 2021 colonoscopies  ? Hyperlipidemia   ? Mild intermittent asthma   ? rare use of ventolin inhaler  ? Obesity, Class I, BMI 30-34.9   ? Recurrent low back pain   ? +SI jt per pt (Murphy/Wainer)  ? ? ?Past Surgical History:  ?Procedure Laterality Date  ? APPENDECTOMY  1964  ? CARDIAC CATHETERIZATION  07/31/2003  ? LV norm. EF58%,norm coronaries.  ? CATARACT EXTRACTION Left 08/26/2021  ? CATARACT EXTRACTION Right 08/12/2021  ? CHOLECYSTECTOMY  2016  ? COLONOSCOPY    ? multiple (+FH CC and personal hx of polyps). 04/27/20 no polyps, recall 5 yrs ([facility], Dr. Andres Shad)  ? HEMORRHOID SURGERY    ? JOINT REPLACEMENT  1993  ? bilateral hip replacement  ? left knee surgery  2010  ? NASAL SINUS SURGERY    ? NM MYOCAR PERF WALL MOTION  03/07/2012  ? EF 57%  low risk scan  ? NM MYOCAR PERF WALL MOTION  10/22/2006  ? EF 53%   ? NM MYOCAR PERF WALL MOTION  07/09/2003  ? MILD ANTERIOR wall thinning w/poss. superimposed anterolateral wall isch .-midventricular apex  distrib. of mid to distal LAD  ? REVISION TOTAL HIP ARTHROPLASTY    ? right - revision- 03/2012  ? TOTAL HIP REVISION  03/27/2012  ? Procedure: TOTAL HIP REVISION;  Surgeon: Ninetta Lights, MD;  Location: [facility];  Service: Orthopedics;  Laterality: Right;  with left knee cortisone injection  ? TOTAL HIP REVISION  05/15/2012  ? Procedure: TOTAL HIP REVISION;  Surgeon: Ninetta Lights, MD;  Location: [facility];  Service: Orthopedics;  Laterality: Left;  ? TOTAL KNEE ARTHROPLASTY Left 01/15/2013  ? Procedure: TOTAL KNEE ARTHROPLASTY- left ;  Surgeon: Ninetta Lights, MD;  Location: [facility];  Service: Orthopedics;  Laterality: Left;  ? VENTRAL HERNIA REPAIR  2018  ? mesh insertion; Surgeron: F Patrica Duel, MD;  Location: [facility]; Service;General; Laterality; N/A;  ? WISDOM TOOTH EXTRACTION    ? ? ?Outpatient Medications Prior to Visit  ?Medication Sig Dispense Refill  ? albuterol (VENTOLIN HFA) 108 (90 Base) MCG/ACT inhaler INHALE 1-2 PUFFS BY MOUTH EVERY 6 HOURS AS NEEDED FOR WHEEZE OR SHORTNESS OF BREATH (Patient not taking: Reported on 10/26/2021) 6.7 each 0  ? atorvastatin (LIPITOR) 40 MG tablet Take 1 tablet (40 mg total) by mouth at bedtime. 90 tablet 3  ? diclofenac (VOLTAREN) 75 MG EC tablet Take 75 mg by mouth 2 (two) times daily as needed.    ? gabapentin (NEURONTIN) 300 MG capsule Take 1 capsule by mouth at bedtime.    ? GLUCOSAMINE-CHONDROITIN PO Take by mouth daily.    ? levocetirizine (XYZAL) 5 MG tablet Take 5 mg by mouth every evening.    ? losartan (COZAAR) 100 MG tablet Take 1 tablet (100 mg total) by mouth daily. 90 tablet 3  ? predniSONE (STERAPRED UNI-PAK 21 TAB) 10 MG (21) TBPK tablet TAKE 6 TABLETS ON DAY 1 AS DIRECTED ON PACKAGE AND DECREASE BY 1 TAB EACH DAY FOR A TOTAL OF 6 DAYS (Patient not taking: Reported on 10/26/2021)    ? predniSONE (DELTASONE) 20 MG tablet 2 tabs po qd x 5d 10 tablet 0  ? ?No facility-administered medications prior to visit.  ? ? ?Allergies  ?Allergen Reactions  ? Sulfa Antibiotics   ?  Childhood allergy  ? Sulfasalazine Other (See Comments)  ?  unknown  ? Lisinopril Cough  ? ? ?ROS ?As per HPI ? ?PE: ? ?  10/26/2021  ?  1:07 PM 06/10/2021  ?  3:48 PM 04/27/2021  ?  9:47 AM  ?Vitals with BMI  ?Height '5\' 9"'$   '5\' 9"'$   ?Weight 210 lbs 10 oz 211 lbs 13 oz 214 lbs 6 oz  ?BMI 31.09  31.65  ?Systolic 132 440 102  ?Diastolic 67 94 83  ?Pulse 100 70 74  ? ? ? ?Physical Exam ? ?Gen: Alert, well appearing.  Patient is oriented to person, place, time, and situation. ?AFFECT: pleasant, lucid thought and speech. ?CV: RRR, no m/r/g.   ?LUNGS: CTA bilat, nonlabored resps, good aeration in all lung fields. ?EXT: no clubbing or cyanosis.  no edema.  ? ? ?LABS:  ?Last CBC ?Lab Results   ?Component Value Date  ? WBC 6.7 04/27/2021  ? HGB 15.0 04/27/2021  ? HCT 44.1 04/27/2021  ? MCV 87.7 04/27/2021  ? MCH 29.8 04/27/2021  ? RDW 13.4 04/27/2021  ? PLT 296 04/27/2021  ? ?Last metabolic panel ?Lab Results  ?Component Value Date  ? GLUCOSE 81 04/27/2021  ? NA 136 04/27/2021  ? K 4.5 04/27/2021  ? CL 99  04/27/2021  ? CO2 28 04/27/2021  ? BUN 12 04/27/2021  ? CREATININE 0.78 04/27/2021  ? GFRNONAA 62 05/21/2020  ? CALCIUM 9.3 04/27/2021  ? PROT 6.7 04/27/2021  ? ALBUMIN 4.1 04/29/2020  ? BILITOT 0.6 04/27/2021  ? ALKPHOS 77 07/01/2020  ? AST 23 04/27/2021  ? ALT 28 04/27/2021  ? ?Last lipids ?Lab Results  ?Component Value Date  ? CHOL 174 04/27/2021  ? HDL 38 (L) 04/27/2021  ? [place] 99 04/27/2021  ? TRIG 242 (H) 04/27/2021  ? CHOLHDL 4.6 04/27/2021  ? ?Last thyroid functions ?Lab Results  ?Component Value Date  ? TSH 2.39 05/28/2007  ? ?IMPRESSION AND PLAN: ? ?#1 hypertension, well controlled on losartan 100 mg a day. ?He will return for fasting labs soon: lytes/creatinine. ? ?#2 hyperlipidemia, doing well on Lipitor 40 mg a day. ?He will return for fasting lipid panel and hepatic panel soon. ? ?An After Visit Summary was printed and given to the patient. ? ?FOLLOW UP: No follow-ups on file. ? ?Signed:  Crissie Sickles, MD           10/26/2021 ? ?

## 2021-11-09 ENCOUNTER — Ambulatory Visit (INDEPENDENT_AMBULATORY_CARE_PROVIDER_SITE_OTHER): Payer: Medicare HMO

## 2021-11-09 DIAGNOSIS — E782 Mixed hyperlipidemia: Secondary | ICD-10-CM | POA: Diagnosis not present

## 2021-11-09 DIAGNOSIS — I10 Essential (primary) hypertension: Secondary | ICD-10-CM

## 2021-11-10 LAB — COMPREHENSIVE METABOLIC PANEL
AG Ratio: 1.5 (calc) (ref 1.0–2.5)
ALT: 14 U/L (ref 9–46)
AST: 15 U/L (ref 10–35)
Albumin: 4 g/dL (ref 3.6–5.1)
Alkaline phosphatase (APISO): 85 U/L (ref 35–144)
BUN: 10 mg/dL (ref 7–25)
CO2: 26 mmol/L (ref 20–32)
Calcium: 9.2 mg/dL (ref 8.6–10.3)
Chloride: 100 mmol/L (ref 98–110)
Creat: 0.81 mg/dL (ref 0.70–1.28)
Globulin: 2.6 g/dL (calc) (ref 1.9–3.7)
Glucose, Bld: 98 mg/dL (ref 65–99)
Potassium: 4.2 mmol/L (ref 3.5–5.3)
Sodium: 137 mmol/L (ref 135–146)
Total Bilirubin: 0.6 mg/dL (ref 0.2–1.2)
Total Protein: 6.6 g/dL (ref 6.1–8.1)

## 2021-11-10 LAB — LIPID PANEL
Cholesterol: 157 mg/dL (ref <200)
HDL: 40 mg/dL (ref >=40)
LDL Cholesterol (Calc): 93 mg/dL (calc)
Non-HDL Cholesterol (Calc): 117 mg/dL (calc) (ref <130)
Total CHOL/HDL Ratio: 3.9 (calc) (ref <5.0)
Triglycerides: 144 mg/dL (ref <150)

## 2021-12-27 ENCOUNTER — Other Ambulatory Visit: Payer: Self-pay | Admitting: Family Medicine

## 2022-02-08 ENCOUNTER — Ambulatory Visit: Payer: Medicare HMO

## 2022-02-15 ENCOUNTER — Ambulatory Visit (INDEPENDENT_AMBULATORY_CARE_PROVIDER_SITE_OTHER): Payer: Medicare HMO

## 2022-02-15 DIAGNOSIS — Z0000 Encounter for general adult medical examination without abnormal findings: Secondary | ICD-10-CM | POA: Diagnosis not present

## 2022-02-15 MED ORDER — ZOSTER VAC RECOMB ADJUVANTED 50 MCG/0.5ML IM SUSR: 0.5000 mL | Freq: Once | INTRAMUSCULAR | 0 refills | Status: AC

## 2022-02-15 NOTE — Progress Notes (Signed)
Subjective:   Robert Lia. is a 71 y.o. male who presents for Medicare Annual/Subsequent preventive examination.  I connected with  Robert Ham. on 02/15/22 by an audio only telemedicine application and verified that I am speaking with the correct person using two identifiers.   I discussed the limitations, risks, security and privacy concerns of performing an evaluation and management service by telephone and the availability of in person appointments. I also discussed with the patient that there may be a patient responsible charge related to this service. The patient expressed understanding and verbally consented to this telephonic visit.  Location of Patient: home Location of Provider: office  List any persons and their role that are participating in the visit with the patient.   Robert [name], CMA  Review of Systems    Defer To PCP Cardiac Risk Factors include: advanced age (>79mn, >>59women);dyslipidemia;hypertension;male gender     Objective:    There were no vitals filed for this visit. There is no height or weight on file to calculate BMI.     02/15/2022   12:06 PM 02/02/2021    9:17 AM 01/03/2013    2:33 PM 05/16/2012    9:29 AM 05/06/2012   10:55 AM 03/29/2012    4:35 AM 03/21/2012   10:54 AM  Advanced Directives  Does Patient Have a Medical Advance Directive? Yes Yes Patient has advance directive, copy not in chart Patient does not have advance directive Patient does not have advance directive;Patient would like information Patient does not have advance directive Patient does not have advance directive;Patient would like information  Type of Advance Directive [place] Living will;Healthcare Power of Attorney   Living will    Copy of [place] Chart? No - copy requested No - copy requested       Would patient like information on creating a medical advance directive?  No - Patient declined    Advance directive packet given Advance directive packet given Advance directive packet given  Pre-existing out of facility DNR order (yellow form or pink MOST form)    No       Current Medications (verified) Outpatient Encounter Medications as of 02/15/2022  Medication Sig   albuterol (VENTOLIN HFA) 108 (90 Base) MCG/ACT inhaler INHALE 1-2 PUFFS BY MOUTH EVERY 6 HOURS AS NEEDED FOR WHEEZE OR SHORTNESS OF BREATH (Patient not taking: Reported on 10/26/2021)   Zoster Vaccine Adjuvanted ([facility] injection Inject 0.5 mLs into the muscle once for 1 dose.   atorvastatin (LIPITOR) 40 MG tablet Take 1 tablet (40 mg total) by mouth at bedtime.   diclofenac (VOLTAREN) 75 MG EC tablet Take 75 mg by mouth 2 (two) times daily as needed.   fluticasone (CUTIVATE) 0.05 % cream APPLY TOPICALLY TWICE A DAY   gabapentin (NEURONTIN) 300 MG capsule Take 1 capsule by mouth at bedtime.   GLUCOSAMINE-CHONDROITIN PO Take by mouth daily.   levocetirizine (XYZAL) 5 MG tablet Take 5 mg by mouth every evening.   losartan (COZAAR) 100 MG tablet Take 1 tablet (100 mg total) by mouth daily.   No facility-administered encounter medications on file as of 02/15/2022.    Allergies (verified) Sulfa antibiotics, Sulfasalazine, and Lisinopril   History: Past Medical History:  Diagnosis Date   Arthritis    heredity - hip dysplasia, OA   Dyshydrosis    Essential hypertension    onset 2020 per prior pcp record   Family history of colon cancer  in mother    rpt colonoscopy due 2026   Family history of prostate cancer in father    Hay fever    History of blood transfusion    autologus   History of colon polyps    rpt colonoscopy due 2026   History of diverticulitis    severe diverticulosis in [place] and [place] on 2016 and 2021 colonoscopies   Hyperlipidemia    Mild intermittent asthma    rare use of ventolin inhaler   Obesity, Class I, BMI 30-34.9    Recurrent low back pain    +SI jt per pt (Murphy/Wainer)   Past Surgical  History:  Procedure Laterality Date   [place]  07/31/2003   LV norm. EF58%,norm coronaries.   CATARACT EXTRACTION Left 08/26/2021   CATARACT EXTRACTION Right 08/12/2021   CHOLECYSTECTOMY  2016   COLONOSCOPY     multiple (+FH CC and personal hx of polyps). 04/27/20 no polyps, recall 5 yrs ([facility], Dr. Andres Shad)   [place]   bilateral hip replacement   left knee surgery  2010   NASAL SINUS SURGERY     NM MYOCAR PERF WALL MOTION  03/07/2012   EF 57%  low risk scan   NM MYOCAR PERF WALL MOTION  10/22/2006   EF 53%    NM MYOCAR PERF WALL MOTION  07/09/2003   MILD ANTERIOR wall thinning w/poss. superimposed anterolateral wall isch .-midventricular apex distrib. of mid to distal LAD   REVISION TOTAL HIP ARTHROPLASTY     right - revision- 03/2012   TOTAL HIP REVISION  03/27/2012   Procedure: TOTAL HIP REVISION;  Surgeon: Ninetta Lights, MD;  Location: [facility];  Service: Orthopedics;  Laterality: Right;  with left knee cortisone injection   TOTAL HIP REVISION  05/15/2012   Procedure: TOTAL HIP REVISION;  Surgeon: Ninetta Lights, MD;  Location: [facility];  Service: Orthopedics;  Laterality: Left;   TOTAL KNEE ARTHROPLASTY Left 01/15/2013   Procedure: TOTAL KNEE ARTHROPLASTY- left ;  Surgeon: Ninetta Lights, MD;  Location: [facility];  Service: Orthopedics;  Laterality: Left;   VENTRAL HERNIA REPAIR  2018   mesh insertion; Surgeron: F Patrica Duel, MD; Location: [facility]; Service;General; Laterality; N/A;   WISDOM TOOTH EXTRACTION     Family History  Problem Relation Age of Onset   Colon cancer Mother 76   Hyperlipidemia Mother    Prostate cancer Father    Leukemia Sister 4   Stroke Maternal Grandfather    Throat cancer Paternal Grandfather    Diabetes Brother    Social History   Socioeconomic History   Marital status: Married    Spouse name: Not on file   Number of children: Not on file   Years of  education: Not on file   Highest education level: Not on file  Occupational History   Not on file  Tobacco Use   Smoking status: Never   Smokeless tobacco: Never  Vaping Use   Vaping Use: Never used  Substance and Sexual Activity   Alcohol use: No    Alcohol/week: 2.0 standard drinks of alcohol    Types: 2 Cans of beer per week    Comment: occasional wine or beer   Drug use: No   Sexual activity: Yes    Partners: Female    Comment: married  Other Topics Concern   Not on file  Social History Narrative  Married, +daughters.   Orig from [place], got PhD in [employer] at [employer] in [place] in 1981.   Occup: Psychologist, educational.   Educ: PhD   No T/A/D   Enjoys golf.   Social Determinants of Health   Financial Resource Strain: Low Risk  (02/15/2022)   Overall Financial Resource Strain (CARDIA)    Difficulty of Paying Living Expenses: Not hard at all  Food Insecurity: No Food Insecurity (02/15/2022)   Hunger Vital Sign    Worried About Running Out of Food in the Last Year: Never true    Ran Out of Food in the Last Year: Never true  Transportation Needs: No Transportation Needs (02/15/2022)   PRAPARE - Hydrologist (Medical): No    Lack of Transportation (Non-Medical): No  Physical Activity: Sufficiently Active (02/15/2022)   Exercise Vital Sign    Days of Exercise per Week: 4 days    Minutes of Exercise per Session: 40 min  Stress: No Stress Concern Present (02/15/2022)   [place]    Feeling of Stress : Not at all  Social Connections: [place] (02/02/2021)   Social Connection and Isolation Panel [NHANES]    Frequency of Communication with Friends and Family: More than three times a week    Frequency of Social Gatherings with Friends and Family: More than three times a week    Attends Religious Services: More than 4 times per year    Active Member  of [employer] or Organizations: Yes    Attends Music therapist: More than 4 times per year    Marital Status: Married    Tobacco Counseling Counseling given: Not Answered   Clinical Intake:  Pre-visit preparation completed: No  Pain : No/denies pain     Nutritional Risks: None Diabetes: No  How often do you need to have someone help you when you read instructions, pamphlets, or other written materials from your doctor or pharmacy?: 1 - Never  Diabetic?no  Interpreter Needed?: No      Activities of Daily Living    02/15/2022   12:04 PM  In your present state of health, do you have any difficulty performing the following activities:  Hearing? 0  Vision? 0  Difficulty concentrating or making decisions? 0  Walking or climbing stairs? 0  Dressing or bathing? 0  Doing errands, shopping? 0  Preparing Food and eating ? N  Using the Toilet? N  In the past six months, have you accidently leaked urine? N  Do you have problems with loss of bowel control? N  Managing your Medications? N  Managing your Finances? N  Housekeeping or managing your Housekeeping? N    Patient Care Team: Tammi Sou, MD as PCP - General (Family Medicine) Marica Otter, Moran (Optometry) Blackard, Julieta Bellini, MD as Consulting Physician (Gastroenterology) Troy Sine, MD as Consulting Physician (Cardiology)  Indicate any recent Medical Services you may have received from other than [facility] providers in the past year (date may be approximate).     Assessment:   This is a routine wellness examination for Tyrik.  Hearing/Vision screen No results found.  Dietary issues and exercise activities discussed: Current Exercise Habits: Home exercise routine, Type of exercise: strength training/weights;stretching;walking, Time (Minutes): 30, Frequency (Times/Week): 4, Weekly Exercise (Minutes/Week): 120, Intensity: Moderate   Goals Addressed   None   Depression Screen    02/15/2022    12:04 PM 10/26/2021  1:24 PM 04/27/2021    9:46 AM 02/02/2021    9:38 AM  PHQ 2/9 Scores  PHQ - 2 Score 0 0 0 0    Fall Risk    02/15/2022   12:04 PM 10/26/2021    1:25 PM 04/27/2021    9:45 AM 02/02/2021    9:19 AM  Fall Risk   Falls in the past year? 0 0 0 0  Number falls in past yr: 0 0 0 0  Injury with Fall? 0 0 0 0  Risk for fall due to : No Fall Risks     Follow up Falls evaluation completed Falls evaluation completed Falls evaluation completed Falls evaluation completed;Falls prevention discussed    FALL RISK PREVENTION PERTAINING TO THE HOME:  Any stairs in or around the home? Yes  If so, are there any without handrails? Yes  Home free of loose throw rugs in walkways, pet beds, electrical cords, etc? Yes  Adequate lighting in your home to reduce risk of falls? Yes   ASSISTIVE DEVICES UTILIZED TO PREVENT FALLS:  Life alert? No  Use of a cane, walker or w/c? No  Grab bars in the bathroom? Yes  Shower chair or bench in shower? Yes  Elevated toilet seat or a handicapped toilet? No   TIMED UP AND GO:  Was the test performed? No .  Length of time to ambulate 10 feet: n/a sec.     Cognitive Function:        02/15/2022   12:06 PM  6CIT Screen  What Year? 0 points  What month? 0 points  What time? 0 points  Count back from 20 0 points  Months in reverse 0 points  Repeat phrase 0 points  Total Score 0 points    Immunizations Immunization History  Administered Date(s) Administered   Fluad Quad(high Dose 65+) 04/27/2021   Influenza Split 01/30/2014   Influenza, High Dose Seasonal PF 04/06/2016, 02/26/2017, 03/28/2018, 04/14/2019   Influenza,inj,Quad PF,6+ Mos 01/30/2014   Influenza,inj,quad, With Preservative 05/05/2015   Janssen (J&J) SARS-COV-2 Vaccination 09/17/2019   Pneumococcal Conjugate-13 10/25/2015, 11/20/2016   Pneumococcal Polysaccharide-23 03/28/2018   Tdap 07/05/2007, 12/02/2019   Zoster, Live 03/05/2014    TDAP status: Up to date  Flu  Vaccine status: Due, Education has been provided regarding the importance of this vaccine. Advised may receive this vaccine at local pharmacy or Health Dept. Aware to provide a copy of the vaccination record if obtained from local pharmacy or Health Dept. Verbalized acceptance and understanding.  Pneumococcal vaccine status: Up to date  Covid-19 vaccine status: Completed vaccines  Qualifies for Shingles Vaccine? Yes   Zostavax completed No   Shingrix Completed?: No.    Education has been provided regarding the importance of this vaccine. Patient has been advised to call insurance company to determine out of pocket expense if they have not yet received this vaccine. Advised may also receive vaccine at local pharmacy or Health Dept. Verbalized acceptance and understanding.  Screening Tests Health Maintenance  Topic Date Due   Zoster Vaccines- Shingrix (1 of 2) Never done   COVID-19 Vaccine (2 - Booster for Janssen series) 11/12/2019   INFLUENZA VACCINE  01/10/2022   COLONOSCOPY (Pts 45-16yr Insurance coverage will need to be confirmed)  04/27/2025   TETANUS/TDAP  12/01/2029   Pneumonia Vaccine 71 Years old  Completed   Hepatitis C Screening  Completed   HPV VACCINES  Aged Out    Health Maintenance  Health Maintenance Due  Topic Date  Due   Zoster Vaccines- Shingrix (1 of 2) Never done   COVID-19 Vaccine (2 - Booster for Janssen series) 11/12/2019   INFLUENZA VACCINE  01/10/2022    Colorectal cancer screening: Type of screening: Colonoscopy. Completed 04/27/2020. Repeat every 5 years  Lung Cancer Screening: (Low Dose CT Chest recommended if Age 24-80 years, 30 pack-year currently smoking OR have quit w/in 15years.) does not qualify.   Lung Cancer Screening Referral: n/a  Additional Screening:  Hepatitis C Screening: does qualify; Completed 06/12/2014  Vision Screening: Recommended annual ophthalmology exams for early detection of glaucoma and other disorders of the eye. Is  the patient up to date with their annual eye exam?  Yes  Who is the provider or what is the name of the office in which the patient attends annual eye exams? [facility] If pt is not established with a provider, would they like to be referred to a provider to establish care? No .   Dental Screening: Recommended annual dental exams for proper oral hygiene  Community Resource Referral / Chronic Care Management: CRR required this visit?  No   CCM required this visit?  No      Plan:     I have personally reviewed and noted the following in the patient's chart:   Medical and social history Use of alcohol, tobacco or illicit drugs  Current medications and supplements including opioid prescriptions. Patient is not currently taking opioid prescriptions. Functional ability and status Nutritional status Physical activity Advanced directives List of other physicians Hospitalizations, surgeries, and ER visits in previous 12 months Vitals Screenings to include cognitive, depression, and falls Referrals and appointments  In addition, I have reviewed and discussed with patient certain preventive protocols, quality metrics, and best practice recommendations. A written personalized care plan for preventive services as well as general preventive health recommendations were provided to patient.     Octaviano Glow, CMA   02/15/2022   Nurse Notes: Non-Face to Face or Face to Face 15 minute visit Encounter   Mr. Tooker , Thank you for taking time to come for your Medicare Wellness Visit. I appreciate your ongoing commitment to your health goals. Please review the following plan we discussed and let me know if I can assist you in the future.   These are the goals we discussed:  Goals      Patient Stated     Increase physical activity         This is a list of the screening recommended for you and due dates:  Health Maintenance  Topic Date Due   Zoster (Shingles) Vaccine (1 of 2)  Never done   COVID-19 Vaccine (2 - Booster for Janssen series) 11/12/2019   Flu Shot  01/10/2022   Colon Cancer Screening  04/27/2025   Tetanus Vaccine  12/01/2029   Pneumonia Vaccine  Completed   Hepatitis C Screening: USPSTF Recommendation to screen - Ages 18-79 yo.  Completed   HPV Vaccine  Aged Out

## 2022-02-15 NOTE — Patient Instructions (Signed)
Health Maintenance, Male Adopting a healthy lifestyle and getting preventive care are important in promoting health and wellness. Ask your health care provider about: The right schedule for you to have regular tests and exams. Things you can do on your own to prevent diseases and keep yourself healthy. What should I know about diet, weight, and exercise? Eat a healthy diet  Eat a diet that includes plenty of vegetables, fruits, low-fat dairy products, and lean protein. Do not eat a lot of foods that are high in solid fats, added sugars, or sodium. Maintain a healthy weight Body mass index (BMI) is a measurement that can be used to identify possible weight problems. It estimates body fat based on height and weight. Your health care provider can help determine your BMI and help you achieve or maintain a healthy weight. Get regular exercise Get regular exercise. This is one of the most important things you can do for your health. Most adults should: Exercise for at least 150 minutes each week. The exercise should increase your heart rate and make you sweat (moderate-intensity exercise). Do strengthening exercises at least twice a week. This is in addition to the moderate-intensity exercise. Spend less time sitting. Even light physical activity can be beneficial. Watch cholesterol and blood lipids Have your blood tested for lipids and cholesterol at 71 years of age, then have this test every 5 years. You may need to have your cholesterol levels checked more often if: Your lipid or cholesterol levels are high. You are older than 71 years of age. You are at high risk for heart disease. What should I know about cancer screening? Many types of cancers can be detected early and may often be prevented. Depending on your health history and family history, you may need to have cancer screening at various ages. This may include screening for: Colorectal cancer. Prostate cancer. Skin cancer. Lung  cancer. What should I know about heart disease, diabetes, and high blood pressure? Blood pressure and heart disease High blood pressure causes heart disease and increases the risk of stroke. This is more likely to develop in people who have high blood pressure readings or are overweight. Talk with your health care provider about your target blood pressure readings. Have your blood pressure checked: Every 3-5 years if you are 18-39 years of age. Every year if you are 40 years old or older. If you are between the ages of 65 and 75 and are a current or former smoker, ask your health care provider if you should have a one-time screening for abdominal aortic aneurysm (AAA). Diabetes Have regular diabetes screenings. This checks your fasting blood sugar level. Have the screening done: Once every three years after age 45 if you are at a normal weight and have a low risk for diabetes. More often and at a younger age if you are overweight or have a high risk for diabetes. What should I know about preventing infection? Hepatitis B If you have a higher risk for hepatitis B, you should be screened for this virus. Talk with your health care provider to find out if you are at risk for hepatitis B infection. Hepatitis C Blood testing is recommended for: Everyone born from 1945 through 1965. Anyone with known risk factors for hepatitis C. Sexually transmitted infections (STIs) You should be screened each year for STIs, including gonorrhea and chlamydia, if: You are sexually active and are younger than 71 years of age. You are older than 71 years of age and your   health care provider tells you that you are at risk for this type of infection. Your sexual activity has changed since you were last screened, and you are at increased risk for chlamydia or gonorrhea. Ask your health care provider if you are at risk. Ask your health care provider about whether you are at high risk for HIV. Your health care provider  may recommend a prescription medicine to help prevent HIV infection. If you choose to take medicine to prevent HIV, you should first get tested for HIV. You should then be tested every 3 months for as long as you are taking the medicine. Follow these instructions at home: Alcohol use Do not drink alcohol if your health care provider tells you not to drink. If you drink alcohol: Limit how much you have to 0-2 drinks a day. Know how much alcohol is in your drink. In the [place] one drink equals one 12 oz bottle of beer (355 mL), one 5 oz glass of wine (148 mL), or one 1 oz glass of hard liquor (44 mL). Lifestyle Do not use any products that contain nicotine or tobacco. These products include cigarettes, chewing tobacco, and vaping devices, such as e-cigarettes. If you need help quitting, ask your health care provider. Do not use street drugs. Do not share needles. Ask your health care provider for help if you need support or information about quitting drugs. General instructions Schedule regular health, dental, and eye exams. Stay current with your vaccines. Tell your health care provider if: You often feel depressed. You have ever been abused or do not feel safe at home. Summary Adopting a healthy lifestyle and getting preventive care are important in promoting health and wellness. Follow your health care provider's instructions about healthy diet, exercising, and getting tested or screened for diseases. Follow your health care provider's instructions on monitoring your cholesterol and blood pressure. This information is not intended to replace advice given to you by your health care provider. Make sure you discuss any questions you have with your health care provider. Document Revised: 10/18/2020 Document Reviewed: 10/18/2020 Elsevier Patient Education  2023 Elsevier Inc.  

## 2022-03-08 ENCOUNTER — Ambulatory Visit: Payer: Medicare HMO | Admitting: Family Medicine

## 2022-03-08 ENCOUNTER — Ambulatory Visit (INDEPENDENT_AMBULATORY_CARE_PROVIDER_SITE_OTHER): Payer: Medicare HMO | Admitting: Family Medicine

## 2022-03-08 ENCOUNTER — Encounter: Payer: Self-pay | Admitting: Family Medicine

## 2022-03-08 VITALS — BP 121/76 | HR 98 | Temp 97.7°F | Ht 69.0 in | Wt 213.0 lb

## 2022-03-08 DIAGNOSIS — K5792 Diverticulitis of intestine, part unspecified, without perforation or abscess without bleeding: Secondary | ICD-10-CM

## 2022-03-08 MED ORDER — CIPROFLOXACIN HCL 500 MG PO TABS: 500.0000 mg | ORAL_TABLET | Freq: Two times a day (BID) | ORAL | 1 refills | Status: AC

## 2022-03-08 MED ORDER — METRONIDAZOLE 500 MG PO TABS: 500.0000 mg | ORAL_TABLET | Freq: Three times a day (TID) | ORAL | 1 refills | Status: AC

## 2022-03-08 NOTE — Progress Notes (Signed)
OFFICE VISIT  03/08/2022  CC:  Chief Complaint  Patient presents with   Abdominal Pain    LLQ; pt has hx of diverticulitis. He tries to eat a lot of fiber. Starting 4 days ago but Monday, it was become nagging. Denies any abd pressure    Patient is a 71 y.o. male who presents for left lower quadrant abdominal pain.  INTERIM HX: Patient has about 5-day history of feeling pain in the left lower quadrant.  Had some mild constipation and fecal leakage during this time but this has cleared up for the most part.  No diarrhea, no blood in stool. No fevers, appetite has been good.  No nausea or vomiting.  Feels like the pain has let up a little bit over the last couple of days.  Past history of diverticulitis, several times, current symptoms mimic past episodes.  Past history of cholecystectomy, appendectomy, and ventral hernia repair.  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  ROS as above, plus--> no CP, no SOB, no wheezing, no cough, no dizziness, no HAs, no rashes, no melena/hematochezia.  No polyuria or polydipsia.  No myalgias or arthralgias.  No focal weakness, paresthesias, or tremors.  No acute vision or hearing abnormalities.  No dysuria or unusual/new urinary urgency or frequency.  No recent changes in lower legs. No palpitations.     Past Medical History:  Diagnosis Date   Arthritis    heredity - hip dysplasia, OA   Dyshydrosis    Essential hypertension    onset 2020 per prior pcp record   Family history of colon cancer in mother    rpt colonoscopy due 2026   Family history of prostate cancer in father    Hay fever    History of blood transfusion    autologus   History of colon polyps    rpt colonoscopy due 2026   History of diverticulitis    severe diverticulosis in [place] and [place] on 2016 and 2021 colonoscopies   Hyperlipidemia    Mild intermittent asthma    rare use of ventolin inhaler    Obesity, Class I, BMI 30-34.9    Recurrent low back pain    +SI jt per pt (Mur[facility]/Wainer)    Past Surgical History:  Procedure Laterality Date   [place]  07/31/2003   LV norm. EF58%,norm coronaries.   CATARACT EXTRACTION Left 08/26/2021   CATARACT EXTRACTION Right 08/12/2021   CHOLECYSTECTOMY  2016   COLONOSCOPY     multiple (+FH CC and personal hx of polyps). 04/27/20 no polyps, recall 5 yrs ([facility], Dr. Andres Shad)   [place]   bilateral hip replacement   left knee surgery  2010   NASAL SINUS SURGERY     NM MYOCAR PERF WALL MOTION  03/07/2012   EF 57%  low risk scan   NM MYOCAR PERF WALL MOTION  10/22/2006   EF 53%    NM MYOCAR PERF WALL MOTION  07/09/2003   MILD ANTERIOR wall thinning w/poss. superimposed anterolateral wall isch .-midventricular apex distrib. of mid to distal LAD   REVISION TOTAL HIP ARTHROPLASTY     right - revision- 03/2012   TOTAL HIP REVISION  03/27/2012   Procedure: TOTAL HIP REVISION;  Surgeon: Ninetta Lights, MD;  Location: [facility];  Service: Orthopedics;  Laterality: Right;  with left knee cortisone  injection   TOTAL HIP REVISION  05/15/2012   Procedure: TOTAL HIP REVISION;  Surgeon: Ninetta Lights, MD;  Location: [facility];  Service: Orthopedics;  Laterality: Left;   TOTAL KNEE ARTHROPLASTY Left 01/15/2013   Procedure: TOTAL KNEE ARTHROPLASTY- left ;  Surgeon: Ninetta Lights, MD;  Location: [facility];  Service: Orthopedics;  Laterality: Left;   VENTRAL HERNIA REPAIR  2018   mesh insertion; Surgeron: F Patrica Duel, MD; Location: [facility]; Service;General; Laterality; N/A;   WISDOM TOOTH EXTRACTION      Outpatient Medications Prior to Visit  Medication Sig Dispense Refill   albuterol (VENTOLIN HFA) 108 (90 Base) MCG/ACT inhaler INHALE 1-2 PUFFS BY MOUTH EVERY 6 HOURS AS NEEDED FOR WHEEZE OR SHORTNESS OF BREATH (Patient not taking: Reported on 10/26/2021) 6.7 each 0    atorvastatin (LIPITOR) 40 MG tablet Take 1 tablet (40 mg total) by mouth at bedtime. 90 tablet 3   clobetasol (TEMOVATE) 0.05 % external solution Apply topically 2 (two) times daily.     fluticasone (CUTIVATE) 0.05 % cream APPLY TOPICALLY TWICE A DAY 30 g 0   gabapentin (NEURONTIN) 300 MG capsule Take 1 capsule by mouth at bedtime.     GLUCOSAMINE-CHONDROITIN PO Take by mouth daily.     ketoconazole (NIZORAL) 2 % shampoo Apply topically 2 (two) times a week.     levocetirizine (XYZAL) 5 MG tablet Take 5 mg by mouth every evening.     losartan (COZAAR) 100 MG tablet Take 1 tablet (100 mg total) by mouth daily. 90 tablet 3   augmented betamethasone dipropionate (DIPROLENE-AF) 0.05 % cream Apply topically at bedtime.     diclofenac (VOLTAREN) 75 MG EC tablet Take 75 mg by mouth 2 (two) times daily as needed. (Patient not taking: Reported on 03/08/2022)     hydrocortisone 2.5 % cream Apply topically.     No facility-administered medications prior to visit.    Allergies  Allergen Reactions   Sulfa Antibiotics     Childhood allergy   Sulfasalazine Other (See Comments)    unknown   Lisinopril Cough    ROS As per HPI  PE:    03/08/2022    2:00 PM 10/26/2021    1:07 PM 06/10/2021    3:48 PM  Vitals with BMI  Height '5\' 9"'$  '5\' 9"'$    Weight 213 lbs 210 lbs 10 oz 211 lbs 13 oz  BMI 74.94 49.67   Systolic 591 638 466  Diastolic 76 67 94  Pulse 98 100 70     Physical Exam  Gen: Alert, well appearing.  Patient is oriented to person, place, time, and situation. AFFECT: pleasant, lucid thought and speech. ABD: Soft, focal left lower quadrant tenderness without rebound or guarding.  Bowel sounds are normal.  No organomegaly or mass.  LABS:  Last CBC Lab Results  Component Value Date   WBC 6.7 04/27/2021   HGB 15.0 04/27/2021   HCT 44.1 04/27/2021   MCV 87.7 04/27/2021   MCH 29.8 04/27/2021   RDW 13.4 04/27/2021   PLT 296 59/93/5701   Last metabolic panel Lab Results  Component  Value Date   GLUCOSE 98 11/09/2021   NA 137 11/09/2021   K 4.2 11/09/2021   CL 100 11/09/2021   CO2 26 11/09/2021   BUN 10 11/09/2021   CREATININE 0.81 11/09/2021   GFRNONAA 62 05/21/2020   CALCIUM 9.2 11/09/2021   PROT 6.6 11/09/2021   ALBUMIN 4.1 04/29/2020   BILITOT 0.6 11/09/2021  ALKPHOS 77 07/01/2020   AST 15 11/09/2021   ALT 14 11/09/2021   IMPRESSION AND PLAN:  Acute diverticulitis, recurrent. We will get him started on metronidazole 500 3 times daily x10 days and Cipro 500 twice daily x14 days. He will return if not improving significantly in 4 to 5 days or if worsening prior to that. I did give him 1 refill of these medications so he can fill and take with him overseas in case he gets a recurrence. Of course, he knows to seek medical care wherever he is if his symptoms are not responding.  Therapeutic expectations and side effect profile of medications discussed today.  Patient's questions answered.  An After Visit Summary was printed and given to the patient.  FOLLOW UP: Return if symptoms worsen or fail to improve.  Signed:  Crissie Sickles, MD           03/08/2022

## 2022-04-02 ENCOUNTER — Other Ambulatory Visit: Payer: Self-pay | Admitting: Family Medicine

## 2022-04-18 ENCOUNTER — Telehealth: Payer: Self-pay

## 2022-04-18 MED ORDER — ATORVASTATIN CALCIUM 40 MG PO TABS: 40.0000 mg | ORAL_TABLET | Freq: Every day | ORAL | 0 refills | Status: DC

## 2022-04-18 NOTE — Telephone Encounter (Signed)
Patient refill request.  Has appt scheduled with Dr. Anitra Lauth on 11/17 [facility] [address]  atorvastatin (LIPITOR) 40 MG table

## 2022-04-18 NOTE — Telephone Encounter (Signed)
30 day supply has been sent to pt's pharmacy. Pt advised.

## 2022-04-26 NOTE — Patient Instructions (Signed)
Health Maintenance, Male Adopting a healthy lifestyle and getting preventive care are important in promoting health and wellness. Ask your health care provider about: The right schedule for you to have regular tests and exams. Things you can do on your own to prevent diseases and keep yourself healthy. What should I know about diet, weight, and exercise? Eat a healthy diet  Eat a diet that includes plenty of vegetables, fruits, low-fat dairy products, and lean protein. Do not eat a lot of foods that are high in solid fats, added sugars, or sodium. Maintain a healthy weight Body mass index (BMI) is a measurement that can be used to identify possible weight problems. It estimates body fat based on height and weight. Your health care provider can help determine your BMI and help you achieve or maintain a healthy weight. Get regular exercise Get regular exercise. This is one of the most important things you can do for your health. Most adults should: Exercise for at least 150 minutes each week. The exercise should increase your heart rate and make you sweat (moderate-intensity exercise). Do strengthening exercises at least twice a week. This is in addition to the moderate-intensity exercise. Spend less time sitting. Even light physical activity can be beneficial. Watch cholesterol and blood lipids Have your blood tested for lipids and cholesterol at 71 years of age, then have this test every 5 years. You may need to have your cholesterol levels checked more often if: Your lipid or cholesterol levels are high. You are older than 71 years of age. You are at high risk for heart disease. What should I know about cancer screening? Many types of cancers can be detected early and may often be prevented. Depending on your health history and family history, you may need to have cancer screening at various ages. This may include screening for: Colorectal cancer. Prostate cancer. Skin cancer. Lung  cancer. What should I know about heart disease, diabetes, and high blood pressure? Blood pressure and heart disease High blood pressure causes heart disease and increases the risk of stroke. This is more likely to develop in people who have high blood pressure readings or are overweight. Talk with your health care provider about your target blood pressure readings. Have your blood pressure checked: Every 3-5 years if you are 18-39 years of age. Every year if you are 40 years old or older. If you are between the ages of 65 and 75 and are a current or former smoker, ask your health care provider if you should have a one-time screening for abdominal aortic aneurysm (AAA). Diabetes Have regular diabetes screenings. This checks your fasting blood sugar level. Have the screening done: Once every three years after age 45 if you are at a normal weight and have a low risk for diabetes. More often and at a younger age if you are overweight or have a high risk for diabetes. What should I know about preventing infection? Hepatitis B If you have a higher risk for hepatitis B, you should be screened for this virus. Talk with your health care provider to find out if you are at risk for hepatitis B infection. Hepatitis C Blood testing is recommended for: Everyone born from 1945 through 1965. Anyone with known risk factors for hepatitis C. Sexually transmitted infections (STIs) You should be screened each year for STIs, including gonorrhea and chlamydia, if: You are sexually active and are younger than 71 years of age. You are older than 71 years of age and your   health care provider tells you that you are at risk for this type of infection. Your sexual activity has changed since you were last screened, and you are at increased risk for chlamydia or gonorrhea. Ask your health care provider if you are at risk. Ask your health care provider about whether you are at high risk for HIV. Your health care provider  may recommend a prescription medicine to help prevent HIV infection. If you choose to take medicine to prevent HIV, you should first get tested for HIV. You should then be tested every 3 months for as long as you are taking the medicine. Follow these instructions at home: Alcohol use Do not drink alcohol if your health care provider tells you not to drink. If you drink alcohol: Limit how much you have to 0-2 drinks a day. Know how much alcohol is in your drink. In the [place] one drink equals one 12 oz bottle of beer (355 mL), one 5 oz glass of wine (148 mL), or one 1 oz glass of hard liquor (44 mL). Lifestyle Do not use any products that contain nicotine or tobacco. These products include cigarettes, chewing tobacco, and vaping devices, such as e-cigarettes. If you need help quitting, ask your health care provider. Do not use street drugs. Do not share needles. Ask your health care provider for help if you need support or information about quitting drugs. General instructions Schedule regular health, dental, and eye exams. Stay current with your vaccines. Tell your health care provider if: You often feel depressed. You have ever been abused or do not feel safe at home. Summary Adopting a healthy lifestyle and getting preventive care are important in promoting health and wellness. Follow your health care provider's instructions about healthy diet, exercising, and getting tested or screened for diseases. Follow your health care provider's instructions on monitoring your cholesterol and blood pressure. This information is not intended to replace advice given to you by your health care provider. Make sure you discuss any questions you have with your health care provider. Document Revised: 10/18/2020 Document Reviewed: 10/18/2020 Elsevier Patient Education  2023 Elsevier Inc.  

## 2022-04-28 ENCOUNTER — Ambulatory Visit (INDEPENDENT_AMBULATORY_CARE_PROVIDER_SITE_OTHER): Payer: Medicare HMO | Admitting: Family Medicine

## 2022-04-28 ENCOUNTER — Encounter: Payer: Self-pay | Admitting: Family Medicine

## 2022-04-28 VITALS — BP 135/82 | HR 75 | Temp 97.9°F | Ht 69.5 in | Wt 212.6 lb

## 2022-04-28 DIAGNOSIS — E7800 Pure hypercholesterolemia, unspecified: Secondary | ICD-10-CM

## 2022-04-28 DIAGNOSIS — Z0000 Encounter for general adult medical examination without abnormal findings: Secondary | ICD-10-CM | POA: Diagnosis not present

## 2022-04-28 DIAGNOSIS — Z125 Encounter for screening for malignant neoplasm of prostate: Secondary | ICD-10-CM

## 2022-04-28 DIAGNOSIS — Z23 Encounter for immunization: Secondary | ICD-10-CM

## 2022-04-28 DIAGNOSIS — I10 Essential (primary) hypertension: Secondary | ICD-10-CM

## 2022-04-28 LAB — PSA, MEDICARE: PSA: 1.88 ng/ml (ref 0.10–4.00)

## 2022-04-28 MED ORDER — ZOSTER VAC RECOMB ADJUVANTED 50 MCG/0.5ML IM SUSR: 0.5000 mL | Freq: Once | INTRAMUSCULAR | 0 refills | Status: AC

## 2022-04-28 NOTE — Progress Notes (Signed)
Office Note 04/28/2022  CC:  Chief Complaint  Patient presents with   Annual Exam    Pt is fasting    HPI:  Patient is a 71 y.o. male who is here for annual health maintenance exam and hypertension and hyperlipidemia.  Doing well, working out 3d/week, working full time, traveling some for this. Home bp's consistently avg 130/80.  Past Medical History:  Diagnosis Date   Arthritis    heredity - hip dysplasia, OA   Dyshydrosis    Essential hypertension    onset 2020 per prior pcp record   Family history of colon cancer in mother    rpt colonoscopy due 2026   Family history of prostate cancer in father    Hay fever    History of blood transfusion    autologus   History of colon polyps    rpt colonoscopy due 2026   History of diverticulitis    severe diverticulosis in [place] and [place] on 2016 and 2021 colonoscopies   Hyperlipidemia    Mild intermittent asthma    rare use of ventolin inhaler   Obesity, Class I, BMI 30-34.9    Recurrent low back pain    +SI jt per pt (Murphy/Wainer)    Past Surgical History:  Procedure Laterality Date   [place]  07/31/2003   LV norm. EF58%,norm coronaries.   CATARACT EXTRACTION Left 08/26/2021   CATARACT EXTRACTION Right 08/12/2021   CHOLECYSTECTOMY  2016   COLONOSCOPY     multiple (+FH CC and personal hx of polyps). 04/27/20 no polyps, recall 5 yrs ([facility], Dr. Andres Shad)   [place]   bilateral hip replacement   left knee surgery  2010   NASAL SINUS SURGERY     NM MYOCAR PERF WALL MOTION  03/07/2012   EF 57%  low risk scan   NM MYOCAR PERF WALL MOTION  10/22/2006   EF 53%    NM MYOCAR PERF WALL MOTION  07/09/2003   MILD ANTERIOR wall thinning w/poss. superimposed anterolateral wall isch .-midventricular apex distrib. of mid to distal LAD   REVISION TOTAL HIP ARTHROPLASTY     right - revision- 03/2012   TOTAL HIP REVISION  03/27/2012   Procedure: TOTAL HIP  REVISION;  Surgeon: Ninetta Lights, MD;  Location: [facility];  Service: Orthopedics;  Laterality: Right;  with left knee cortisone injection   TOTAL HIP REVISION  05/15/2012   Procedure: TOTAL HIP REVISION;  Surgeon: Ninetta Lights, MD;  Location: [facility];  Service: Orthopedics;  Laterality: Left;   TOTAL KNEE ARTHROPLASTY Left 01/15/2013   Procedure: TOTAL KNEE ARTHROPLASTY- left ;  Surgeon: Ninetta Lights, MD;  Location: [facility];  Service: Orthopedics;  Laterality: Left;   VENTRAL HERNIA REPAIR  2018   mesh insertion; Surgeron: F Patrica Duel, MD; [facility]: [facility]; Service;General; Laterality; N/A;   WISDOM TOOTH EXTRACTION      Family History  Problem Relation Age of Onset   Colon cancer Mother 54   Hyperlipidemia Mother    Prostate cancer Father    Leukemia Sister 4   Stroke Maternal Grandfather    Throat cancer Paternal Grandfather    Diabetes Brother     Social History   Socioeconomic History   Marital status: Married    Spouse name: Not on file   Number of children: Not on file   Years of education: Not on file   Highest  education level: Not on file  Occupational History   Not on file  Tobacco Use   Smoking status: Never   Smokeless tobacco: Never  Vaping Use   Vaping Use: Never used  Substance and Sexual Activity   Alcohol use: No    Alcohol/week: 2.0 standard drinks of alcohol    Types: 2 Cans of beer per week    Comment: occasional wine or beer   Drug use: No   Sexual activity: Yes    Partners: Female    Comment: married  Other Topics Concern   Not on file  Social History Narrative   Married, +daughters.   Orig from [place], got PhD in [employer] at [place] in [place] in 1981.   Occup: Psychologist, educational.   Educ: PhD   No T/A/D   Enjoys golf.   Social Determinants of Health   Financial Resource Strain: Low Risk  (02/15/2022)   Overall Financial Resource Strain (CARDIA)    Difficulty of Paying Living Expenses:  Not hard at all  Food Insecurity: No Food Insecurity (02/15/2022)   Hunger Vital Sign    Worried About Running Out of Food in the Last Year: Never true    Ran Out of Food in the Last Year: Never true  Transportation Needs: No Transportation Needs (02/15/2022)   PRAPARE - Hydrologist (Medical): No    Lack of Transportation (Non-Medical): No  Physical Activity: Sufficiently Active (02/15/2022)   Exercise Vital Sign    Days of Exercise per Week: 4 days    Minutes of Exercise per Session: 40 min  Stress: No Stress Concern Present (02/15/2022)   [place]    Feeling of Stress : Not at all  Social Connections: [place] (02/02/2021)   Social Connection and Isolation Panel [NHANES]    Frequency of Communication with Friends and Family: More than three times a week    Frequency of Social Gatherings with Friends and Family: More than three times a week    Attends Religious Services: More than 4 times per year    Active Member of [employer] or Organizations: Yes    Attends Music therapist: More than 4 times per year    Marital Status: Married  Human resources officer Violence: Not At Risk (02/15/2022)   Humiliation, Afraid, Rape, and Kick questionnaire    Fear of Current or Ex-Partner: No    Emotionally Abused: No    Physically Abused: No    Sexually Abused: No    Outpatient Medications Prior to Visit  Medication Sig Dispense Refill   atorvastatin (LIPITOR) 40 MG tablet Take 1 tablet (40 mg total) by mouth at bedtime. 30 tablet 0   diclofenac (VOLTAREN) 75 MG EC tablet Take 75 mg by mouth 2 (two) times daily as needed.     Fexofenadine-Pseudoephedrine (ALLEGRA-D PO) Take 1 tablet by mouth once a week.     gabapentin (NEURONTIN) 300 MG capsule Take 1 capsule by mouth at bedtime.     GLUCOSAMINE-CHONDROITIN PO Take by mouth daily.     losartan (COZAAR) 100 MG tablet TAKE 1 TABLET BY MOUTH EVERY  DAY 30 tablet 0   albuterol (VENTOLIN HFA) 108 (90 Base) MCG/ACT inhaler INHALE 1-2 PUFFS BY MOUTH EVERY 6 HOURS AS NEEDED FOR WHEEZE OR SHORTNESS OF BREATH (Patient not taking: Reported on 10/26/2021) 6.7 each 0   augmented betamethasone dipropionate (DIPROLENE-AF) 0.05 % cream Apply topically at bedtime. (  Patient not taking: Reported on 04/28/2022)     clobetasol (TEMOVATE) 0.05 % external solution Apply topically 2 (two) times daily. (Patient not taking: Reported on 04/28/2022)     fluticasone (CUTIVATE) 0.05 % cream APPLY TOPICALLY TWICE A DAY (Patient not taking: Reported on 04/28/2022) 30 g 0   hydrocortisone 2.5 % cream Apply topically. (Patient not taking: Reported on 04/28/2022)     ketoconazole (NIZORAL) 2 % shampoo Apply topically 2 (two) times a week. (Patient not taking: Reported on 04/28/2022)     levocetirizine (XYZAL) 5 MG tablet Take 5 mg by mouth every evening. (Patient not taking: Reported on 04/28/2022)     No facility-administered medications prior to visit.    Allergies  Allergen Reactions   Sulfa Antibiotics     Childhood allergy   Sulfasalazine Other (See Comments)    unknown   Lisinopril Cough    ROS Review of Systems  Constitutional:  Negative for appetite change, chills, fatigue and fever.  HENT:  Negative for congestion, dental problem, ear pain and sore throat.   Eyes:  Negative for discharge, redness and visual disturbance.  Respiratory:  Negative for cough, chest tightness, shortness of breath and wheezing.   Cardiovascular:  Negative for chest pain, palpitations and leg swelling.  Gastrointestinal:  Negative for abdominal pain, blood in stool, diarrhea, nausea and vomiting.  Genitourinary:  Negative for difficulty urinating, dysuria, flank pain, frequency, hematuria and urgency.  Musculoskeletal:  Negative for arthralgias, back pain, joint swelling, myalgias and neck stiffness.  Skin:  Negative for pallor and rash.  Neurological:  Negative for dizziness,  speech difficulty, weakness and headaches.  Hematological:  Negative for adenopathy. Does not bruise/bleed easily.  Psychiatric/Behavioral:  Negative for confusion and sleep disturbance. The patient is not nervous/anxious.     PE;    04/28/2022    8:30 AM 03/08/2022    2:00 PM 10/26/2021    1:07 PM  Vitals with BMI  Height 5' 9.5" '5\' 9"'$  '5\' 9"'$   Weight 212 lbs 10 oz 213 lbs 210 lbs 10 oz  BMI 30.96 73.41 93.79  Systolic 024 097 353  Diastolic 82 76 67  Pulse 75 98 100     Gen: Alert, well appearing.  Patient is oriented to person, place, time, and situation. AFFECT: pleasant, lucid thought and speech. ENT: Ears: EACs clear, normal epithelium.  TMs with good light reflex and landmarks bilaterally.  Eyes: no injection, icteris, swelling, or exudate.  EOMI, PERRLA. Nose: no drainage or turbinate edema/swelling.  No injection or focal lesion.  Mouth: lips without lesion/swelling.  Oral mucosa pink and moist.  Dentition intact and without obvious caries or gingival swelling.  Oropharynx without erythema, exudate, or swelling.  Neck: supple/nontender.  No LAD, mass, or TM.  Carotid pulses 2+ bilaterally, without bruits. CV: RRR, no m/r/g.   LUNGS: CTA bilat, nonlabored resps, good aeration in all lung fields. ABD: soft, NT, ND, BS normal.  No hepatospenomegaly or mass.  No bruits. EXT: no clubbing, cyanosis, or edema.  Musculoskeletal: no joint swelling, erythema, warmth, or tenderness.  ROM of all joints intact. Skin - no sores or suspicious lesions or rashes or color changes  Pertinent labs:  Lab Results  Component Value Date   TSH 2.39 05/28/2007   Lab Results  Component Value Date   WBC 6.7 04/27/2021   HGB 15.0 04/27/2021   HCT 44.1 04/27/2021   MCV 87.7 04/27/2021   PLT 296 04/27/2021   Lab Results  Component Value Date  CREATININE 0.81 11/09/2021   BUN 10 11/09/2021   NA 137 11/09/2021   K 4.2 11/09/2021   CL 100 11/09/2021   CO2 26 11/09/2021   Lab Results   Component Value Date   ALT 14 11/09/2021   AST 15 11/09/2021   ALKPHOS 77 07/01/2020   BILITOT 0.6 11/09/2021   Lab Results  Component Value Date   CHOL 157 11/09/2021   Lab Results  Component Value Date   HDL 40 11/09/2021   Lab Results  Component Value Date   LDLCALC 93 11/09/2021   Lab Results  Component Value Date   TRIG 144 11/09/2021   Lab Results  Component Value Date   CHOLHDL 3.9 11/09/2021   Lab Results  Component Value Date   PSA 2.00 04/27/2021   PSA 1.98 04/29/2020   PSA 2.08 04/23/2019   ASSESSMENT AND PLAN:   1) Health maintenance exam: Reviewed age and gender appropriate health maintenance issues (prudent diet, regular exercise, health risks of tobacco and excessive alcohol, use of seatbelts, fire alarms in home, use of sunscreen).  Also reviewed age and gender appropriate health screening as well as vaccine recommendations. Vaccines: Shingrix->rx to pharm.  Flu->given today.  Otherwise ALL UTD. Labs: cbc, cmet, flp, PSA. Prostate ca screening: FH prostate cancer->PSA. Colon ca screening: FH colon cancer-->recall 04/2025.  2) HTN, well controlled on losartan 100 qd. Lytes/cr today.  3) HLD, doing well on atorva 40 qd. Lipids and hepatic panel today.  An After Visit Summary was printed and given to the patient.  FOLLOW UP:  Return in about 6 months (around 10/27/2022) for routine chronic illness f/u.  Signed:  Crissie Sickles, MD           04/28/2022

## 2022-04-29 LAB — CBC
HCT: 45.4 % (ref 38.5–50.0)
Hemoglobin: 15.4 g/dL (ref 13.2–17.1)
MCH: 29.7 pg (ref 27.0–33.0)
MCHC: 33.9 g/dL (ref 32.0–36.0)
MCV: 87.5 fL (ref 80.0–100.0)
MPV: 10.9 fL (ref 7.5–12.5)
Platelets: 259 10*3/uL (ref 140–400)
RBC: 5.19 10*6/uL (ref 4.20–5.80)
RDW: 14.1 % (ref 11.0–15.0)
WBC: 5.9 10*3/uL (ref 3.8–10.8)

## 2022-04-29 LAB — COMPREHENSIVE METABOLIC PANEL
AG Ratio: 1.7 (calc) (ref 1.0–2.5)
ALT: 25 U/L (ref 9–46)
AST: 19 U/L (ref 10–35)
Albumin: 4.2 g/dL (ref 3.6–5.1)
Alkaline phosphatase (APISO): 76 U/L (ref 35–144)
BUN: 14 mg/dL (ref 7–25)
CO2: 28 mmol/L (ref 20–32)
Calcium: 9.4 mg/dL (ref 8.6–10.3)
Chloride: 102 mmol/L (ref 98–110)
Creat: 0.79 mg/dL (ref 0.70–1.28)
Globulin: 2.5 g/dL (calc) (ref 1.9–3.7)
Glucose, Bld: 103 mg/dL — ABNORMAL HIGH (ref 65–99)
Potassium: 4.7 mmol/L (ref 3.5–5.3)
Sodium: 139 mmol/L (ref 135–146)
Total Bilirubin: 0.6 mg/dL (ref 0.2–1.2)
Total Protein: 6.7 g/dL (ref 6.1–8.1)

## 2022-04-29 LAB — LIPID PANEL
Cholesterol: 171 mg/dL (ref <200)
HDL: 50 mg/dL (ref >=40)
LDL Cholesterol (Calc): 98 mg/dL (calc)
Non-HDL Cholesterol (Calc): 121 mg/dL (calc) (ref <130)
Total CHOL/HDL Ratio: 3.4 (calc) (ref <5.0)
Triglycerides: 129 mg/dL (ref <150)

## 2022-05-16 ENCOUNTER — Telehealth: Payer: Self-pay

## 2022-05-16 MED ORDER — ATORVASTATIN CALCIUM 40 MG PO TABS: 40.0000 mg | ORAL_TABLET | Freq: Every day | ORAL | 1 refills | Status: DC

## 2022-05-16 NOTE — Telephone Encounter (Signed)
6 month supply sent to pharmacy. LVM for pt to return call.

## 2022-05-16 NOTE — Telephone Encounter (Signed)
Patient refill request - Requesting 90 d/s,  Will need new prescription. Patient had cpe with Dr. Anitra Lauth on 11/17.  [facility] - [place]   atorvastatin (LIPITOR) 40 MG tablet

## 2022-05-17 NOTE — Telephone Encounter (Signed)
LVM for pt regarding medication.   

## 2022-05-18 NOTE — Telephone Encounter (Signed)
If pt returns call, please advise medication was refilled.

## 2022-06-08 ENCOUNTER — Other Ambulatory Visit: Payer: Self-pay | Admitting: Family Medicine

## 2022-10-26 NOTE — Patient Instructions (Signed)

## 2022-10-27 ENCOUNTER — Ambulatory Visit (INDEPENDENT_AMBULATORY_CARE_PROVIDER_SITE_OTHER): Payer: Medicare HMO | Admitting: Family Medicine

## 2022-10-27 ENCOUNTER — Encounter: Payer: Self-pay | Admitting: Family Medicine

## 2022-10-27 VITALS — BP 145/92 | HR 71 | Wt 215.6 lb

## 2022-10-27 DIAGNOSIS — I10 Essential (primary) hypertension: Secondary | ICD-10-CM | POA: Diagnosis not present

## 2022-10-27 DIAGNOSIS — E7800 Pure hypercholesterolemia, unspecified: Secondary | ICD-10-CM | POA: Diagnosis not present

## 2022-10-27 NOTE — Progress Notes (Signed)
OFFICE VISIT  10/27/2022  CC:  Chief Complaint  Patient presents with   Medical Management of Chronic Issues    Patient is a 72 y.o. male who presents for 47-month follow-up hypertension and hyperlipidemia.  INTERIM HX: Neylan feels well. No problems with losartan 100 mg a day and atorvastatin 40 mg a day. He no longer monitors blood pressure at home because it had been normal for so long.  ROS--> no fevers, no CP, no SOB, no wheezing, no cough, no dizziness, no HAs, no rashes, no melena/hematochezia.  No polyuria or polydipsia.  No myalgias or arthralgias.  No focal weakness, paresthesias, or tremors.  No acute vision or hearing abnormalities.  No dysuria or unusual/new urinary urgency or frequency.  No recent changes in lower legs. No n/v/d or abd pain.  No palpitations.     Past Medical History:  Diagnosis Date   Arthritis    heredity - hip dysplasia, OA   Dyshydrosis    Essential hypertension    onset 2020 per prior pcp record   Family history of colon cancer in mother    rpt colonoscopy due 2026   Family history of prostate cancer in father    Hay fever    History of blood transfusion    autologus   History of colon polyps    rpt colonoscopy due 2026   History of diverticulitis    severe diverticulosis in [place] and [place] on 2016 and 2021 colonoscopies   Hyperlipidemia    Mild intermittent asthma    rare use of ventolin inhaler   Obesity, Class I, BMI 30-34.9    Recurrent low back pain    +SI jt per pt (Murphy/Wainer)    Past Surgical History:  Procedure Laterality Date   APPENDECTOMY  1964   CARDIAC CATHETERIZATION  07/31/2003   LV norm. EF58%,norm coronaries.   CATARACT EXTRACTION Left 08/26/2021   CATARACT EXTRACTION Right 08/12/2021   CHOLECYSTECTOMY  2016   COLONOSCOPY     multiple (+FH CC and personal hx of polyps). 04/27/20 no polyps, recall 5 yrs ([facility], Dr. Doristine Church)   HEMORRHOID SURGERY     JOINT REPLACEMENT  1993   bilateral hip replacement   left knee  surgery  2010   NASAL SINUS SURGERY     NM MYOCAR PERF WALL MOTION  03/07/2012   EF 57%  low risk scan   NM MYOCAR PERF WALL MOTION  10/22/2006   EF 53%    NM MYOCAR PERF WALL MOTION  07/09/2003   MILD ANTERIOR wall thinning w/poss. superimposed anterolateral wall isch .-midventricular apex distrib. of mid to distal LAD   REVISION TOTAL HIP ARTHROPLASTY     right - revision- 03/2012   TOTAL HIP REVISION  03/27/2012   Procedure: TOTAL HIP REVISION;  Surgeon: Loreta Ave, MD;  Location[facility];  Service: Orthopedics;  Laterality: Right;  with left knee cortisone injection   TOTAL HIP REVISION  05/15/2012   Procedure: TOTAL HIP REVISION;  Surgeon: Loreta Ave, MD;  Location: [facility] OR;  Service: Orthopedics;  Laterality: Left;   TOTAL KNEE ARTHROPLASTY Left 01/15/2013   Procedure: TOTAL KNEE ARTHROPLASTY- left ;  Surgeon: Loreta Ave, MD;  Location: [facility] OR;  Service: Orthopedics;  Laterality: Left;   VENTRAL HERNIA REPAIR  2018   mesh insertion; Surgeron: F Sue Lush, MD; Location: [facility]; Service;General; Laterality; N/A;   WISDOM TOOTH EXTRACTION      Outpatient Medications Prior to Visit  Medication Sig  Dispense Refill   atorvastatin (LIPITOR) 40 MG tablet Take 1 tablet (40 mg total) by mouth at bedtime. 90 tablet 1   diclofenac (VOLTAREN) 75 MG EC tablet Take 75 mg by mouth 2 (two) times daily as needed.     Fexofenadine-Pseudoephedrine (ALLEGRA-D PO) Take 1 tablet by mouth once a week.     gabapentin (NEURONTIN) 300 MG capsule Take 1 capsule by mouth at bedtime.     GLUCOSAMINE-CHONDROITIN PO Take by mouth daily.     losartan (COZAAR) 100 MG tablet TAKE 1 TABLET BY MOUTH EVERY DAY 90 tablet 1   No facility-administered medications prior to visit.    Allergies  Allergen Reactions   Sulfa Antibiotics     Childhood allergy   Sulfasalazine Other (See Comments)    unknown   Lisinopril Cough    Review of Systems As per HPI  PE:    10/27/2022    1:09  PM 04/28/2022    8:30 AM 03/08/2022    2:00 PM  Vitals with BMI  Height  5' 9.5" 5\' 9"   Weight 215 lbs 10 oz 212 lbs 10 oz 213 lbs  BMI  30.96 31.44  Systolic 145 135 409  Diastolic 92 82 76  Pulse 71 75 98   Rpt bp 140/82  Physical Exam  Gen: Alert, well appearing.  Patient is oriented to person, place, time, and situation. AFFECT: pleasant, lucid thought and speech. CV: RRR, no m/r/g.   LUNGS: CTA bilat, nonlabored resps, good aeration in all lung fields. EXT: no clubbing or cyanosis.  no edema.    LABS:  Last CBC Lab Results  Component Value Date   WBC 5.9 04/28/2022   HGB 15.4 04/28/2022   HCT 45.4 04/28/2022   MCV 87.5 04/28/2022   MCH 29.7 04/28/2022   RDW 14.1 04/28/2022   PLT 259 04/28/2022   Last metabolic panel Lab Results  Component Value Date   GLUCOSE 103 (H) 04/28/2022   NA 139 04/28/2022   K 4.7 04/28/2022   CL 102 04/28/2022   CO2 28 04/28/2022   BUN 14 04/28/2022   CREATININE 0.79 04/28/2022   GFRNONAA 62 05/21/2020   CALCIUM 9.4 04/28/2022   PROT 6.7 04/28/2022   ALBUMIN 4.1 04/29/2020   BILITOT 0.6 04/28/2022   ALKPHOS 77 07/01/2020   AST 19 04/28/2022   ALT 25 04/28/2022   Last lipids Lab Results  Component Value Date   CHOL 171 04/28/2022   HDL 50 04/28/2022   LDLCALC 98 04/28/2022   TRIG 129 04/28/2022   CHOLHDL 3.4 04/28/2022   Last thyroid functions Lab Results  Component Value Date   TSH 2.39 05/28/2007   Last vitamin B12 and Folate Lab Results  Component Value Date   VITAMINB12 609 05/28/2007   FOLATE 14.0 05/28/2007   Lab Results  Component Value Date   PSA 1.88 04/28/2022   PSA 2.00 04/27/2021   PSA 1.98 04/29/2020   IMPRESSION AND PLAN:  #1 hypertension, historically well controlled. Elev bp here today. Continue losartan 100 mg a day. He will monitor blood pressure at home over the next week and bring these in to drop off when he comes and gets his fasting labs.  #2 hypercholesterolemia, doing well on  atorvastatin 40 mg a day. Not fasting today . LDL goal <100.  Last LDL 6 months ago was 98. Return at earliest convenience for lab visit for fasting lipid panel.  An After Visit Summary was printed and given to the patient.  FOLLOW UP: Return in about 6 months (around 04/29/2023) for annual CPE (fasting) (needs lab appt at his convenience for fasting labs ).  Signed:  Santiago Bumpers, MD           10/27/2022

## 2022-11-02 ENCOUNTER — Other Ambulatory Visit: Payer: Medicare HMO

## 2022-11-02 DIAGNOSIS — I10 Essential (primary) hypertension: Secondary | ICD-10-CM

## 2022-11-02 DIAGNOSIS — E7800 Pure hypercholesterolemia, unspecified: Secondary | ICD-10-CM

## 2022-11-03 LAB — COMPREHENSIVE METABOLIC PANEL
AG Ratio: 1.6 (calc) (ref 1.0–2.5)
ALT: 19 U/L (ref 9–46)
AST: 15 U/L (ref 10–35)
Albumin: 4 g/dL (ref 3.6–5.1)
Alkaline phosphatase (APISO): 89 U/L (ref 35–144)
BUN: 12 mg/dL (ref 7–25)
CO2: 26 mmol/L (ref 20–32)
Calcium: 9.1 mg/dL (ref 8.6–10.3)
Chloride: 102 mmol/L (ref 98–110)
Creat: 0.79 mg/dL (ref 0.70–1.28)
Globulin: 2.5 g/dL (calc) (ref 1.9–3.7)
Glucose, Bld: 102 mg/dL — ABNORMAL HIGH (ref 65–99)
Potassium: 4.8 mmol/L (ref 3.5–5.3)
Sodium: 137 mmol/L (ref 135–146)
Total Bilirubin: 0.5 mg/dL (ref 0.2–1.2)
Total Protein: 6.5 g/dL (ref 6.1–8.1)

## 2022-11-03 LAB — LIPID PANEL
Cholesterol: 156 mg/dL (ref <200)
HDL: 44 mg/dL (ref >=40)
LDL Cholesterol (Calc): 86 mg/dL (calc)
Non-HDL Cholesterol (Calc): 112 mg/dL (calc) (ref <130)
Total CHOL/HDL Ratio: 3.5 (calc) (ref <5.0)
Triglycerides: 165 mg/dL — ABNORMAL HIGH (ref <150)

## 2022-11-10 ENCOUNTER — Other Ambulatory Visit: Payer: Self-pay | Admitting: Family Medicine

## 2023-01-25 ENCOUNTER — Encounter (INDEPENDENT_AMBULATORY_CARE_PROVIDER_SITE_OTHER): Payer: Self-pay

## 2023-02-21 ENCOUNTER — Ambulatory Visit: Payer: Medicare HMO

## 2023-02-27 ENCOUNTER — Other Ambulatory Visit: Payer: Self-pay | Admitting: Family Medicine

## 2023-03-07 ENCOUNTER — Telehealth: Payer: Self-pay | Admitting: Family Medicine

## 2023-03-07 NOTE — Telephone Encounter (Signed)
Prescription Request  03/07/2023  LOV: 10/27/2022 [facility]   What is the name of the medication or equipment? atorvastatin (LIPITOR) 40 MG tablet   Have you contacted your pharmacy to request a refill? Yes   Which pharmacy would you like this sent to?  [id number] [address] - [place], [place] - [address] [place] [address] Phone: [phone] Fax: [phone]  [id number] - [place], [place] - [address] AT [facility] [address] [place] [address] Phone: [phone] Fax: [phone]  [id number] [place], [place] - [address] [place] [address] Phone: [phone] Fax: [phone]    Patient notified that their request is being sent to the clinical staff for review and that they should receive a response within 2 business days.   Please advise at Mobile [phone] (mobile)

## 2023-03-08 MED ORDER — ATORVASTATIN CALCIUM 40 MG PO TABS: 40.0000 mg | ORAL_TABLET | Freq: Every day | ORAL | 3 refills | Status: DC

## 2023-03-08 NOTE — Telephone Encounter (Signed)
Molli Knock, sent

## 2023-03-21 ENCOUNTER — Ambulatory Visit: Payer: Medicare HMO

## 2023-03-21 VITALS — BP 148/82 | HR 65 | Temp 98.0°F | Wt 214.8 lb

## 2023-03-21 DIAGNOSIS — Z0000 Encounter for general adult medical examination without abnormal findings: Secondary | ICD-10-CM | POA: Diagnosis not present

## 2023-03-21 NOTE — Progress Notes (Signed)
Subjective:   Robert [name]. is a 72 y.o. male who presents for Medicare Annual/Subsequent preventive examination.  Visit Complete: In person  Patient Medicare AWV questionnaire was completed by the patient on 03/21/23; I have confirmed that all information answered by patient is correct and no changes since this date.  Cardiac Risk Factors include: advanced age (>19men, >50 women);dyslipidemia;hypertension;male gender     Objective:    Today's Vitals   03/21/23 1313 03/21/23 1315  BP: (!) 147/98 (!) 148/82  Pulse: 65   Temp: 98 F (36.7 C)   SpO2: 97%   Weight: 214 lb 12.8 oz (97.4 kg)    Body mass index is 31.27 kg/m.     03/21/2023    3:58 PM 02/15/2022   12:06 PM 02/02/2021    9:17 AM 01/03/2013    2:33 PM 05/16/2012    9:29 AM 05/06/2012   10:55 AM 03/29/2012    4:35 AM  Advanced Directives  Does Patient Have a Medical Advance Directive? Yes Yes Yes Patient has advance directive, copy not in chart Patient does not have advance directive Patient does not have advance directive;Patient would like information Patient does not have advance directive  Type of Advance Directive Healthcare Power of [place];Living will Healthcare Power of [place];Living will Living will;Healthcare Power of Attorney   Living will   Does patient want to make changes to medical advance directive? No - Patient declined        Copy of Healthcare Power of Attorney in Chart? No - copy requested No - copy requested No - copy requested      Would patient like information on creating a medical advance directive?   No - Patient declined   Advance directive packet given Advance directive packet given  Pre-existing out of facility DNR order (yellow form or pink MOST form)     No      Current Medications (verified) Outpatient Encounter Medications as of 03/21/2023  Medication Sig   atorvastatin (LIPITOR) 40 MG tablet Take 1 tablet (40 mg total) by mouth daily.   diclofenac (VOLTAREN) 75 MG EC tablet  Take 75 mg by mouth 2 (two) times daily as needed.   Fexofenadine-Pseudoephedrine (ALLEGRA-D PO) Take 1 tablet by mouth once a week.   gabapentin (NEURONTIN) 300 MG capsule Take 1 capsule by mouth at bedtime.   GLUCOSAMINE-CHONDROITIN PO Take by mouth daily.   losartan (COZAAR) 100 MG tablet TAKE 1 TABLET BY MOUTH EVERY DAY   No facility-administered encounter medications on file as of 03/21/2023.    Allergies (verified) Sulfa antibiotics, Sulfasalazine, and Lisinopril   History: Past Medical History:  Diagnosis Date   Arthritis    heredity - hip dysplasia, OA   Dyshydrosis    Essential hypertension    onset 2020 per prior pcp record   Family history of colon cancer in mother    rpt colonoscopy due 2026   Family history of prostate cancer in father    Hay fever    History of blood transfusion    autologus   History of colon polyps    rpt colonoscopy due 2026   History of diverticulitis    severe diverticulosis in [place] and [place] on 2016 and 2021 colonoscopies   Hyperlipidemia    Mild intermittent asthma    rare use of ventolin inhaler   Obesity, Class I, BMI 30-34.9    Recurrent low back pain    +SI jt per pt (Murphy/Wainer)   Past Surgical History:  Procedure  Laterality Date   APPENDECTOMY  1964   CARDIAC CATHETERIZATION  07/31/2003   LV norm. EF58%,norm coronaries.   CATARACT EXTRACTION Left 08/26/2021   CATARACT EXTRACTION Right 08/12/2021   CHOLECYSTECTOMY  2016   COLONOSCOPY     multiple (+FH CC and personal hx of polyps). 04/27/20 no polyps, recall 5 yrs ([facility], Dr. Doristine Church)   HEMORRHOID SURGERY     JOINT REPLACEMENT  1993   bilateral hip replacement   left knee surgery  2010   NASAL SINUS SURGERY     NM MYOCAR PERF WALL MOTION  03/07/2012   EF 57%  low risk scan   NM MYOCAR PERF WALL MOTION  10/22/2006   EF 53%    NM MYOCAR PERF WALL MOTION  07/09/2003   MILD ANTERIOR wall thinning w/poss. superimposed anterolateral wall isch .-midventricular apex distrib. of  mid to distal LAD   REVISION TOTAL HIP ARTHROPLASTY     right - revision- 03/2012   TOTAL HIP REVISION  03/27/2012   Procedure: TOTAL HIP REVISION;  Surgeon: Loreta Ave, MD;  Location: [facility] OR;  Service: Orthopedics;  Laterality: Right;  with left knee cortisone injection   TOTAL HIP REVISION  05/15/2012   Procedure: TOTAL HIP REVISION;  Surgeon: Loreta Ave, MD;  Location: [facility] OR;  Service: Orthopedics;  Laterality: Left;   TOTAL KNEE ARTHROPLASTY Left 01/15/2013   Procedure: TOTAL KNEE ARTHROPLASTY- left ;  Surgeon: Loreta Ave, MD;  Location: [facility] OR;  Service: Orthopedics;  Laterality: Left;   VENTRAL HERNIA REPAIR  2018   mesh insertion; Surgeron: F Sue Lush, MD; Location: [facility]; Service;General; Laterality; N/A;   WISDOM TOOTH EXTRACTION     Family History  Problem Relation Age of Onset   Colon cancer Mother 10   Hyperlipidemia Mother    Prostate cancer Father    Leukemia Sister 4   Stroke Maternal Grandfather    Throat cancer Paternal Grandfather    Diabetes Brother    Social History   Socioeconomic History   Marital status: Married    Spouse name: Not on file   Number of children: Not on file   Years of education: Not on file   Highest education level: Not on file  Occupational History   Not on file  Tobacco Use   Smoking status: Never   Smokeless tobacco: Never  Vaping Use   Vaping status: Never Used  Substance and Sexual Activity   Alcohol use: No    Alcohol/week: 2.0 standard drinks of alcohol    Types: 2 Cans of beer per week    Comment: occasional wine or beer   Drug use: No   Sexual activity: Yes    Partners: Female    Comment: married  Other Topics Concern   Not on file  Social History Narrative   Married, +daughters.   Orig from [place], got PhD in [employer] at [employer] in [place] in 1981.   Occup: Development worker, community.   Educ: PhD   No T/A/D   Enjoys golf.   Social Determinants of Health    Financial Resource Strain: Low Risk  (03/21/2023)   Overall Financial Resource Strain (CARDIA)    Difficulty of Paying Living Expenses: Not hard at all  Food Insecurity: No Food Insecurity (03/21/2023)   Hunger Vital Sign    Worried About Running Out of Food in the Last Year: Never true    Ran Out of Food in the Last Year: Never true  Transportation Needs: No Transportation Needs (03/21/2023)   PRAPARE - Administrator, Civil Service (Medical): No    Lack of Transportation (Non-Medical): No  Physical Activity: Insufficiently Active (03/21/2023)   Exercise Vital Sign    Days of Exercise per Week: 4 days    Minutes of Exercise per Session: 30 min  Stress: No Stress Concern Present (03/21/2023)   [employer] of Occupational Health - Occupational Stress Questionnaire    Feeling of Stress : Not at all  Social Connections: Socially Integrated (03/21/2023)   Social Connection and Isolation Panel [NHANES]    Frequency of Communication with Friends and Family: More than three times a week    Frequency of Social Gatherings with Friends and Family: More than three times a week    Attends Religious Services: More than 4 times per year    Active Member of [employer] or Organizations: Yes    Attends Engineer, structural: More than 4 times per year    Marital Status: Married    Tobacco Counseling Counseling given: Not Answered   Clinical Intake:  Pre-visit preparation completed: No  Pain : No/denies pain     Nutritional Risks: None Diabetes: No  How often do you need to have someone help you when you read instructions, pamphlets, or other written materials from your doctor or pharmacy?: 1 - Never  Interpreter Needed?: No      Activities of Daily Living    03/21/2023    3:57 PM  In your present state of health, do you have any difficulty performing the following activities:  Hearing? 0  Vision? 0  Difficulty concentrating or making decisions? 0  Walking or  climbing stairs? 0  Dressing or bathing? 0  Doing errands, shopping? 0  Preparing Food and eating ? N  Using the Toilet? N  In the past six months, have you accidently leaked urine? Y  Do you have problems with loss of bowel control? N  Managing your Medications? N  Managing your Finances? N  Housekeeping or managing your Housekeeping? N    Patient Care Team: Jeoffrey Massed, MD as PCP - General (Family Medicine) Blima Ledger, OD (Optometry) Blackard, Jacelyn Pi, MD as Consulting Physician (Gastroenterology) Lennette Bihari, MD as Consulting Physician (Cardiology)  Indicate any recent Medical Services you may have received from other than [facility] providers in the past year (date may be approximate).     Assessment:   This is a routine wellness examination for [place].  Hearing/Vision screen No results found.   Goals Addressed             This Visit's Progress    Patient Stated       Lose 5 lbs       Depression Screen    03/21/2023    3:57 PM 10/27/2022    1:13 PM 04/28/2022    8:35 AM 02/15/2022   12:04 PM 10/26/2021    1:24 PM 04/27/2021    9:46 AM 02/02/2021    9:38 AM  PHQ 2/9 Scores  PHQ - 2 Score 0 0 0 0 0 0 0  PHQ- 9 Score  0         Fall Risk    03/21/2023    3:57 PM 10/27/2022    1:13 PM 04/28/2022    8:35 AM 02/15/2022   12:04 PM 10/26/2021    1:25 PM  Fall Risk   Falls in the past year? 0 0 0 0 0  Number falls in past yr: 0  0 0 0  Injury with Fall? 0 0 0 0 0  Risk for fall due to : No Fall Risks  No Fall Risks No Fall Risks   Follow up Falls evaluation completed  Falls evaluation completed Falls evaluation completed Falls evaluation completed    MEDICARE RISK AT HOME: Medicare Risk at Home Any stairs in or around the home?: Yes If so, are there any without handrails?: No Home free of loose throw rugs in walkways, pet beds, electrical cords, etc?: Yes Adequate lighting in your home to reduce risk of falls?: Yes Life alert?: No Use of a cane,  walker or w/c?: No Grab bars in the bathroom?: Yes Shower chair or bench in shower?: Yes Elevated toilet seat or a handicapped toilet?: No  TIMED UP AND GO:  Was the test performed?  Yes  Length of time to ambulate 10 feet: 7 sec Gait steady and fast without use of assistive device    Cognitive Function:        03/21/2023    1:13 PM 02/15/2022   12:06 PM  6CIT Screen  What Year? 0 points 0 points  What month? 0 points 0 points  What time? 0 points 0 points  Count back from 20 0 points 0 points  Months in reverse 0 points 0 points  Repeat phrase 0 points 0 points  Total Score 0 points 0 points    Immunizations Immunization History  Administered Date(s) Administered   Fluad Quad(high Dose 65+) 04/27/2021   Fluzone Influenza virus vaccine,trivalent (IIV3), split virus 05/05/2015   Influenza Split 01/30/2014   Influenza, High Dose Seasonal PF 04/06/2016, 02/26/2017, 03/28/2018, 04/14/2019   Influenza,inj,Quad PF,6+ Mos 01/30/2014   Influenza,inj,quad, With Preservative 05/05/2015   Janssen (J&J) SARS-COV-2 Vaccination 09/17/2019   Pneumococcal Conjugate-13 10/25/2015, 11/20/2016   Pneumococcal Polysaccharide-23 03/28/2018   Tdap 07/05/2007, 12/02/2019   Zoster, Live 03/05/2014    TDAP status: Up to date  Flu Vaccine status: Declined, Education has been provided regarding the importance of this vaccine but patient still declined. Advised may receive this vaccine at local pharmacy or Health Dept. Aware to provide a copy of the vaccination record if obtained from local pharmacy or Health Dept. Verbalized acceptance and understanding.  Pneumococcal vaccine status: Up to date  Covid-19 vaccine status: Completed vaccines  Qualifies for Shingles Vaccine? Yes   Zostavax completed No   Shingrix Completed?: No.    Education has been provided regarding the importance of this vaccine. Patient has been advised to call insurance company to determine out of pocket expense if they have  not yet received this vaccine. Advised may also receive vaccine at local pharmacy or Health Dept. Verbalized acceptance and understanding.  Screening Tests Health Maintenance  Topic Date Due   COVID-19 Vaccine (2 - 2023-24 season) 04/06/2023 (Originally 02/11/2023)   Zoster Vaccines- Shingrix (1 of 2) 06/21/2023 (Originally 06/24/2000)   INFLUENZA VACCINE  09/10/2023 (Originally 01/11/2023)   Medicare Annual Wellness (AWV)  03/20/2024   Colonoscopy  04/27/2025   DTaP/Tdap/Td (3 - Td or Tdap) 12/01/2029   Pneumonia Vaccine 20+ Years old  Completed   Hepatitis C Screening  Completed   HPV VACCINES  Aged Out    Health Maintenance  There are no preventive care reminders to display for this patient.   Colorectal cancer screening: Type of screening: Colonoscopy. Completed 04/27/20. Repeat every 5 years  Lung Cancer Screening: (Low Dose CT Chest recommended if Age 51-80 years, 20 pack-year  currently smoking OR have quit w/in 15years.) does not qualify.   Lung Cancer Screening Referral: n/a  Additional Screening:  Hepatitis C Screening: does qualify; Completed 06/12/14  Vision Screening: Recommended annual ophthalmology exams for early detection of glaucoma and other disorders of the eye. Is the patient up to date with their annual eye exam?  Yes  Who is the provider or what is the name of the office in which the patient attends annual eye exams? [facility] If pt is not established with a provider, would they like to be referred to a provider to establish care? No .   Dental Screening: Recommended annual dental exams for proper oral hygiene  Diabetic Foot Exam: n/a  Community Resource Referral / Chronic Care Management: CRR required this visit?  No   CCM required this visit?  No     Plan:     I have personally reviewed and noted the following in the patient's chart:   Medical and social history Use of alcohol, tobacco or illicit drugs  Current medications and supplements  including opioid prescriptions. Patient is not currently taking opioid prescriptions. Functional ability and status Nutritional status Physical activity Advanced directives List of other physicians Hospitalizations, surgeries, and ER visits in previous 12 months Vitals Screenings to include cognitive, depression, and falls Referrals and appointments  In addition, I have reviewed and discussed with patient certain preventive protocols, quality metrics, and best practice recommendations. A written personalized care plan for preventive services as well as general preventive health recommendations were provided to patient.     Filomena Jungling, CMA   03/21/2023   After Visit Summary: (In Person-Declined) Patient declined AVS at this time.  Nurse Notes: Non-Face to Face or Face to Face 10 minute visit Encounter    Mr. Corrales , Thank you for taking time to come for your Medicare Wellness Visit. I appreciate your ongoing commitment to your health goals. Please review the following plan we discussed and let me know if I can assist you in the future.   These are the goals we discussed:  Goals      Patient Stated     Increase physical activity      Patient Stated     Lose 5 lbs        This is a list of the screening recommended for you and due dates:  Health Maintenance  Topic Date Due   COVID-19 Vaccine (2 - 2023-24 season) 04/06/2023*   Zoster (Shingles) Vaccine (1 of 2) 06/21/2023*   Flu Shot  09/10/2023*   Medicare Annual Wellness Visit  03/20/2024   Colon Cancer Screening  04/27/2025   DTaP/Tdap/Td vaccine (3 - Td or Tdap) 12/01/2029   Pneumonia Vaccine  Completed   Hepatitis C Screening  Completed   HPV Vaccine  Aged Out  *Topic was postponed. The date shown is not the original due date.

## 2023-03-21 NOTE — Patient Instructions (Signed)
Health Maintenance, Male Adopting a healthy lifestyle and getting preventive care are important in promoting health and wellness. Ask your health care provider about: The right schedule for you to have regular tests and exams. Things you can do on your own to prevent diseases and keep yourself healthy. What should I know about diet, weight, and exercise? Eat a healthy diet  Eat a diet that includes plenty of vegetables, fruits, low-fat dairy products, and lean protein. Do not eat a lot of foods that are high in solid fats, added sugars, or sodium. Maintain a healthy weight Body mass index (BMI) is a measurement that can be used to identify possible weight problems. It estimates body fat based on height and weight. Your health care provider can help determine your BMI and help you achieve or maintain a healthy weight. Get regular exercise Get regular exercise. This is one of the most important things you can do for your health. Most adults should: Exercise for at least 150 minutes each week. The exercise should increase your heart rate and make you sweat (moderate-intensity exercise). Do strengthening exercises at least twice a week. This is in addition to the moderate-intensity exercise. Spend less time sitting. Even light physical activity can be beneficial. Watch cholesterol and blood lipids Have your blood tested for lipids and cholesterol at 72 years of age, then have this test every 5 years. You may need to have your cholesterol levels checked more often if: Your lipid or cholesterol levels are high. You are older than 72 years of age. You are at high risk for heart disease. What should I know about cancer screening? Many types of cancers can be detected early and may often be prevented. Depending on your health history and family history, you may need to have cancer screening at various ages. This may include screening for: Colorectal cancer. Prostate cancer. Skin cancer. Lung  cancer. What should I know about heart disease, diabetes, and high blood pressure? Blood pressure and heart disease High blood pressure causes heart disease and increases the risk of stroke. This is more likely to develop in people who have high blood pressure readings or are overweight. Talk with your health care provider about your target blood pressure readings. Have your blood pressure checked: Every 3-5 years if you are 18-39 years of age. Every year if you are 40 years old or older. If you are between the ages of 65 and 75 and are a current or former smoker, ask your health care provider if you should have a one-time screening for abdominal aortic aneurysm (AAA). Diabetes Have regular diabetes screenings. This checks your fasting blood sugar level. Have the screening done: Once every three years after age 45 if you are at a normal weight and have a low risk for diabetes. More often and at a younger age if you are overweight or have a high risk for diabetes. What should I know about preventing infection? Hepatitis B If you have a higher risk for hepatitis B, you should be screened for this virus. Talk with your health care provider to find out if you are at risk for hepatitis B infection. Hepatitis C Blood testing is recommended for: Everyone born from 1945 through 1965. Anyone with known risk factors for hepatitis C. Sexually transmitted infections (STIs) You should be screened each year for STIs, including gonorrhea and chlamydia, if: You are sexually active and are younger than 72 years of age. You are older than 72 years of age and your   health care provider tells you that you are at risk for this type of infection. Your sexual activity has changed since you were last screened, and you are at increased risk for chlamydia or gonorrhea. Ask your health care provider if you are at risk. Ask your health care provider about whether you are at high risk for HIV. Your health care provider  may recommend a prescription medicine to help prevent HIV infection. If you choose to take medicine to prevent HIV, you should first get tested for HIV. You should then be tested every 3 months for as long as you are taking the medicine. Follow these instructions at home: Alcohol use Do not drink alcohol if your health care provider tells you not to drink. If you drink alcohol: Limit how much you have to 0-2 drinks a day. Know how much alcohol is in your drink. In the [place] one drink equals one 12 oz bottle of beer (355 mL), one 5 oz glass of wine (148 mL), or one 1 oz glass of hard liquor (44 mL). Lifestyle Do not use any products that contain nicotine or tobacco. These products include cigarettes, chewing tobacco, and vaping devices, such as e-cigarettes. If you need help quitting, ask your health care provider. Do not use street drugs. Do not share needles. Ask your health care provider for help if you need support or information about quitting drugs. General instructions Schedule regular health, dental, and eye exams. Stay current with your vaccines. Tell your health care provider if: You often feel depressed. You have ever been abused or do not feel safe at home. Summary Adopting a healthy lifestyle and getting preventive care are important in promoting health and wellness. Follow your health care provider's instructions about healthy diet, exercising, and getting tested or screened for diseases. Follow your health care provider's instructions on monitoring your cholesterol and blood pressure. This information is not intended to replace advice given to you by your health care provider. Make sure you discuss any questions you have with your health care provider. Document Revised: 10/18/2020 Document Reviewed: 10/18/2020 Elsevier Patient Education  2024 Elsevier Inc.  

## 2023-04-27 ENCOUNTER — Other Ambulatory Visit: Payer: Self-pay | Admitting: Family Medicine

## 2023-04-27 NOTE — Telephone Encounter (Signed)
Pt has appt scheduled for 11/18

## 2023-04-30 ENCOUNTER — Encounter: Payer: Medicare HMO | Admitting: Family Medicine

## 2023-05-11 ENCOUNTER — Other Ambulatory Visit: Payer: Self-pay | Admitting: Family Medicine

## 2023-05-18 ENCOUNTER — Ambulatory Visit: Payer: Medicare HMO | Admitting: Family Medicine

## 2023-05-18 ENCOUNTER — Encounter: Payer: Self-pay | Admitting: Family Medicine

## 2023-05-18 VITALS — BP 146/91 | HR 88 | Ht 69.0 in | Wt 218.0 lb

## 2023-05-18 DIAGNOSIS — Z125 Encounter for screening for malignant neoplasm of prostate: Secondary | ICD-10-CM

## 2023-05-18 DIAGNOSIS — E7800 Pure hypercholesterolemia, unspecified: Secondary | ICD-10-CM | POA: Diagnosis not present

## 2023-05-18 DIAGNOSIS — Z0001 Encounter for general adult medical examination with abnormal findings: Secondary | ICD-10-CM

## 2023-05-18 DIAGNOSIS — M25511 Pain in right shoulder: Secondary | ICD-10-CM | POA: Diagnosis not present

## 2023-05-18 DIAGNOSIS — I10 Essential (primary) hypertension: Secondary | ICD-10-CM | POA: Diagnosis not present

## 2023-05-18 DIAGNOSIS — H9193 Unspecified hearing loss, bilateral: Secondary | ICD-10-CM | POA: Diagnosis not present

## 2023-05-18 DIAGNOSIS — M25512 Pain in left shoulder: Secondary | ICD-10-CM

## 2023-05-18 DIAGNOSIS — Z0000 Encounter for general adult medical examination without abnormal findings: Secondary | ICD-10-CM

## 2023-05-18 DIAGNOSIS — L309 Dermatitis, unspecified: Secondary | ICD-10-CM | POA: Diagnosis not present

## 2023-05-18 DIAGNOSIS — G8929 Other chronic pain: Secondary | ICD-10-CM

## 2023-05-18 MED ORDER — FLUTICASONE PROPIONATE 0.05 % EX CREA: TOPICAL_CREAM | Freq: Two times a day (BID) | CUTANEOUS | 1 refills | Status: DC

## 2023-05-18 MED ORDER — LOSARTAN POTASSIUM 50 MG PO TABS: ORAL_TABLET | ORAL | 3 refills | Status: DC

## 2023-05-18 NOTE — Addendum Note (Signed)
Addended by: Arty Baumgartner A on: 05/18/2023 02:48 PM   Modules accepted: Orders

## 2023-05-18 NOTE — Progress Notes (Signed)
Office Note 05/18/2023  CC:  Chief Complaint  Patient presents with   Annual Exam   Patient is a 72 y.o. male who is here for annual health maintenance exam and 45-month follow-up hypertension and hyperlipidemia.. A/P as of last visit: "1 hypertension, historically well controlled. Elev bp here today. Continue losartan 100 mg a day. He will monitor blood pressure at home over the next week and bring these in to drop off when he comes and gets his fasting labs.   #2 hypercholesterolemia, doing well on atorvastatin 40 mg a day. Not fasting today . LDL goal <100.  Last LDL 6 months ago was 98. Return at earliest convenience for lab visit for fasting lipid panel."  INTERIM HX: 2 days ago Robert [name] started getting lots of sneezing, nasal congestion/runny nose, and cough.  No wheezing, shortness of breath, or fever.  Home blood pressure monitoring after last visit showed more elevations in the afternoon and evenings in the mornings. His daughter is a Teacher, early years/pre and suggested he split his losartan 100 mg tab in half and take half in the morning and half in the evening.  This resulted in excellent blood pressure control and he felt better. Today's blood pressure is a bit elevated here.  He says he has been taking some DayQuil for his cold and that he was hurrying to get up here for his appointment today.  Chronic bilateral shoulder pain, was told by his orthopedist Dr. Eulah Pont in the past that he had arthritis. This pain has been progressing lately and he requests referral to Dr. Jerl Santos.  Has eczema on fingers of both hands.  He moisturizes well but despite this he has had itching and little bumps of dry skin and requests topical steroid.  His wife has been concerned lately that he has decreased hearing. He is not sure.  Past Medical History:  Diagnosis Date   Arthritis    heredity - hip dysplasia, OA   Dyshydrosis    Essential hypertension    onset 2020 per prior pcp record    Family history of colon cancer in mother    rpt colonoscopy due 2026   Family history of prostate cancer in father    Hay fever    History of blood transfusion    autologus   History of colon polyps    rpt colonoscopy due 2026   History of diverticulitis    severe diverticulosis in [place] and [place] on 2016 and 2021 colonoscopies   Hyperlipidemia    Mild intermittent asthma    rare use of ventolin inhaler   Obesity, Class I, BMI 30-34.9    Recurrent low back pain    +SI jt per pt ([facility])    Past Surgical History:  Procedure Laterality Date   APPENDECTOMY  1964   CARDIAC CATHETERIZATION  07/31/2003   LV norm. EF58%,norm coronaries.   CATARACT EXTRACTION Left 08/26/2021   CATARACT EXTRACTION Right 08/12/2021   CHOLECYSTECTOMY  2016   COLONOSCOPY     multiple (+FH CC and personal hx of polyps). 04/27/20 no polyps, recall 5 yrs ([facility], Dr. Doristine Church)   HEMORRHOID SURGERY     JOINT REPLACEMENT  1993   bilateral hip replacement   left knee surgery  2010   NASAL SINUS SURGERY     NM MYOCAR PERF WALL MOTION  03/07/2012   EF 57%  low risk scan   NM MYOCAR PERF WALL MOTION  10/22/2006   EF 53%    NM MYOCAR PERF WALL MOTION  07/09/2003   MILD ANTERIOR wall thinning w/poss. superimposed anterolateral wall isch .-midventricular apex distrib. of mid to distal LAD   REVISION TOTAL HIP ARTHROPLASTY     right - revision- 03/2012   TOTAL HIP REVISION  03/27/2012   Procedure: TOTAL HIP REVISION;  Surgeon: Loreta Ave, MD;  Location: [facility] OR;  Service: Orthopedics;  Laterality: Right;  with left knee cortisone injection   TOTAL HIP REVISION  05/15/2012   Procedure: TOTAL HIP REVISION;  Surgeon: Loreta Ave, MD;  Location: [facility] OR;  Service: Orthopedics;  Laterality: Left;   TOTAL KNEE ARTHROPLASTY Left 01/15/2013   Procedure: TOTAL KNEE ARTHROPLASTY- left ;  Surgeon: Loreta Ave, MD;  Location: [facility] OR;  Service: Orthopedics;  Laterality: Left;   VENTRAL HERNIA REPAIR  2018   mesh  insertion; Surgeron: F Sue Lush, MD; Location: [facility] OR; Service;General; Laterality; N/A;   WISDOM TOOTH EXTRACTION      Family History  Problem Relation Age of Onset   Colon cancer Mother 54   Hyperlipidemia Mother    Prostate cancer Father    Leukemia Sister 4   Stroke Maternal Grandfather    Throat cancer Paternal Grandfather    Diabetes Brother     Social History   Socioeconomic History   Marital status: Married    Spouse name: Not on file   Number of children: Not on file   Years of education: Not on file   Highest education level: Not on file  Occupational History   Not on file  Tobacco Use   Smoking status: Never   Smokeless tobacco: Never  Vaping Use   Vaping status: Never Used  Substance and Sexual Activity   Alcohol use: No    Alcohol/week: 2.0 standard drinks of alcohol    Types: 2 Cans of beer per week    Comment: occasional wine or beer   Drug use: No   Sexual activity: Yes    Partners: Female    Comment: married  Other Topics Concern   Not on file  Social History Narrative   Married, +daughters.   Orig from [place], got PhD in [employer] at [employer] in [place] in 1981.   Occup: Development worker, community.   Educ: PhD   No T/A/D   Enjoys golf.   Social Determinants of Health   Financial Resource Strain: Low Risk  (03/21/2023)   Overall Financial Resource Strain (CARDIA)    Difficulty of Paying Living Expenses: Not hard at all  Food Insecurity: No Food Insecurity (03/21/2023)   Hunger Vital Sign    Worried About Running Out of Food in the Last Year: Never true    Ran Out of Food in the Last Year: Never true  Transportation Needs: No Transportation Needs (03/21/2023)   PRAPARE - Administrator, Civil Service (Medical): No    Lack of Transportation (Non-Medical): No  Physical Activity: Insufficiently Active (03/21/2023)   Exercise Vital Sign    Days of Exercise per Week: 4 days    Minutes of  Exercise per Session: 30 min  Stress: No Stress Concern Present (03/21/2023)   [employer] of Occupational Health - Occupational Stress Questionnaire    Feeling of Stress : Not at all  Social Connections: Socially Integrated (03/21/2023)   Social Connection and Isolation Panel [NHANES]    Frequency of Communication with Friends and Family: More than three times a week    Frequency of Social Gatherings with Friends and Family:  More than three times a week    Attends Religious Services: More than 4 times per year    Active Member of Clubs or Organizations: Yes    Attends Banker Meetings: More than 4 times per year    Marital Status: Married  Catering manager Violence: Not At Risk (03/21/2023)   Humiliation, Afraid, Rape, and Kick questionnaire    Fear of Current or Ex-Partner: No    Emotionally Abused: No    Physically Abused: No    Sexually Abused: No    Outpatient Medications Prior to Visit  Medication Sig Dispense Refill   atorvastatin (LIPITOR) 40 MG tablet Take 1 tablet (40 mg total) by mouth daily. 90 tablet 3   diclofenac (VOLTAREN) 75 MG EC tablet Take 75 mg by mouth 2 (two) times daily as needed.     Fexofenadine-Pseudoephedrine (ALLEGRA-D PO) Take 1 tablet by mouth once a week.     gabapentin (NEURONTIN) 300 MG capsule Take 1 capsule by mouth at bedtime.     GLUCOSAMINE-CHONDROITIN PO Take by mouth daily.     losartan (COZAAR) 100 MG tablet TAKE 1 TABLET BY MOUTH EVERY DAY 30 tablet 0   No facility-administered medications prior to visit.    Allergies  Allergen Reactions   Sulfa Antibiotics     Childhood allergy   Sulfasalazine Other (See Comments)    unknown   Lisinopril Cough    Review of Systems  Constitutional:  Negative for appetite change, chills, fatigue and fever.  HENT:  Positive for postnasal drip, rhinorrhea and sneezing. Negative for congestion, dental problem, ear pain and sore throat.   Eyes:  Negative for discharge, redness and  visual disturbance.  Respiratory:  Positive for cough. Negative for chest tightness, shortness of breath and wheezing.   Cardiovascular:  Negative for chest pain, palpitations and leg swelling.  Gastrointestinal:  Negative for abdominal pain, blood in stool, diarrhea, nausea and vomiting.  Genitourinary:  Negative for difficulty urinating, dysuria, flank pain, frequency, hematuria and urgency.  Musculoskeletal:  Negative for arthralgias, back pain, joint swelling, myalgias and neck stiffness.  Skin:  Negative for pallor and rash.  Neurological:  Negative for dizziness, speech difficulty, weakness and headaches.  Hematological:  Negative for adenopathy. Does not bruise/bleed easily.  Psychiatric/Behavioral:  Negative for confusion and sleep disturbance. The patient is not nervous/anxious.     PE;    05/18/2023    1:52 PM 03/21/2023    1:15 PM 03/21/2023    1:13 PM  Vitals with BMI  Height 5\' 9"     Weight 218 lbs  214 lbs 13 oz  BMI 32.18    Systolic 146 148 540  Diastolic 91 82 98  Pulse 88  65   Gen: Alert, well appearing.  Patient is oriented to person, place, time, and situation. AFFECT: pleasant, lucid thought and speech. ENT: Ears: EACs clear, normal epithelium.  TMs with good light reflex and landmarks bilaterally.  Eyes: no injection, icteris, swelling, or exudate.  EOMI, PERRLA. Nose: no drainage or turbinate edema/swelling.  No injection or focal lesion.  Mouth: lips without lesion/swelling.  Oral mucosa pink and moist.  Dentition intact and without obvious caries or gingival swelling.  Oropharynx without erythema, exudate, or swelling.  Neck: supple/nontender.  No LAD, mass, or TM.  Carotid pulses 2+ bilaterally, without bruits. CV: RRR, no m/r/g.   LUNGS: CTA bilat, nonlabored resps, good aeration in all lung fields. ABD: soft, NT, ND, BS normal.  No hepatospenomegaly or mass.  No bruits. EXT: no clubbing, cyanosis, or edema.  Musculoskeletal: no joint swelling, erythema,  warmth, or tenderness.  ROM of all joints intact. Skin - no sores or suspicious lesions or rashes or color changes  Pertinent labs:  Lab Results  Component Value Date   TSH 2.39 05/28/2007   Lab Results  Component Value Date   WBC 5.9 04/28/2022   HGB 15.4 04/28/2022   HCT 45.4 04/28/2022   MCV 87.5 04/28/2022   PLT 259 04/28/2022   Lab Results  Component Value Date   CREATININE 0.79 11/02/2022   BUN 12 11/02/2022   NA 137 11/02/2022   K 4.8 11/02/2022   CL 102 11/02/2022   CO2 26 11/02/2022   Lab Results  Component Value Date   ALT 19 11/02/2022   AST 15 11/02/2022   ALKPHOS 77 07/01/2020   BILITOT 0.5 11/02/2022   Lab Results  Component Value Date   CHOL 156 11/02/2022   Lab Results  Component Value Date   HDL 44 11/02/2022   Lab Results  Component Value Date   LDLCALC 86 11/02/2022   Lab Results  Component Value Date   TRIG 165 (H) 11/02/2022   Lab Results  Component Value Date   CHOLHDL 3.5 11/02/2022   Lab Results  Component Value Date   PSA 1.88 04/28/2022   PSA 2.00 04/27/2021   PSA 1.98 04/29/2020   ASSESSMENT AND PLAN:   #1 health maintenance exam: Reviewed age and gender appropriate health maintenance issues (prudent diet, regular exercise, health risks of tobacco and excessive alcohol, use of seatbelts, fire alarms in home, use of sunscreen).  Also reviewed age and gender appropriate health screening as well as vaccine recommendations. Vaccines:  ALL UTD. Labs: He is not fasting today but we will go ahead and do cbc, cmet, flp, PSA. Prostate ca screening: FH prostate cancer->PSA. Colon ca screening: FH colon cancer-->recall 04/2025  #2 hypertension, well-controlled taking one half of the 100 mg losartan twice a day. Will prescribe new 50 mg tabs and take 1 twice daily. Electrolytes and creatinine today.  3.  Hypercholesterolemia, doing well on atorvastatin 40 mg daily. Checking nonfasting lipid panel today as well as hepatic  panel.  4.  Viral URI. Reassured.  Continue symptomatic care.  5.  Bilateral shoulder pain, history of osteoarthritis. Referral ordered to Dr. Jerl Santos per patient request.  6.  Decreased hearing suspected, chronic. Referral to audiology today.  7.  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 Cutivate 0.05% cream.  Apply twice daily as needed.  An After Visit Summary was printed and given to the patient.  FOLLOW UP:  Return in about 6 months (around 11/16/2023) for routine chronic illness f/u.  Signed:  Santiago Bumpers, MD           05/18/2023

## 2023-05-18 NOTE — Patient Instructions (Signed)
Health Maintenance, Male Adopting a healthy lifestyle and getting preventive care are important in promoting health and wellness. Ask your health care provider about: The right schedule for you to have regular tests and exams. Things you can do on your own to prevent diseases and keep yourself healthy. What should I know about diet, weight, and exercise? Eat a healthy diet  Eat a diet that includes plenty of vegetables, fruits, low-fat dairy products, and lean protein. Do not eat a lot of foods that are high in solid fats, added sugars, or sodium. Maintain a healthy weight Body mass index (BMI) is a measurement that can be used to identify possible weight problems. It estimates body fat based on height and weight. Your health care provider can help determine your BMI and help you achieve or maintain a healthy weight. Get regular exercise Get regular exercise. This is one of the most important things you can do for your health. Most adults should: Exercise for at least 150 minutes each week. The exercise should increase your heart rate and make you sweat (moderate-intensity exercise). Do strengthening exercises at least twice a week. This is in addition to the moderate-intensity exercise. Spend less time sitting. Even light physical activity can be beneficial. Watch cholesterol and blood lipids Have your blood tested for lipids and cholesterol at 72 years of age, then have this test every 5 years. You may need to have your cholesterol levels checked more often if: Your lipid or cholesterol levels are high. You are older than 72 years of age. You are at high risk for heart disease. What should I know about cancer screening? Many types of cancers can be detected early and may often be prevented. Depending on your health history and family history, you may need to have cancer screening at various ages. This may include screening for: Colorectal cancer. Prostate cancer. Skin cancer. Lung  cancer. What should I know about heart disease, diabetes, and high blood pressure? Blood pressure and heart disease High blood pressure causes heart disease and increases the risk of stroke. This is more likely to develop in people who have high blood pressure readings or are overweight. Talk with your health care provider about your target blood pressure readings. Have your blood pressure checked: Every 3-5 years if you are 18-39 years of age. Every year if you are 40 years old or older. If you are between the ages of 65 and 75 and are a current or former smoker, ask your health care provider if you should have a one-time screening for abdominal aortic aneurysm (AAA). Diabetes Have regular diabetes screenings. This checks your fasting blood sugar level. Have the screening done: Once every three years after age 45 if you are at a normal weight and have a low risk for diabetes. More often and at a younger age if you are overweight or have a high risk for diabetes. What should I know about preventing infection? Hepatitis B If you have a higher risk for hepatitis B, you should be screened for this virus. Talk with your health care provider to find out if you are at risk for hepatitis B infection. Hepatitis C Blood testing is recommended for: Everyone born from 1945 through 1965. Anyone with known risk factors for hepatitis C. Sexually transmitted infections (STIs) You should be screened each year for STIs, including gonorrhea and chlamydia, if: You are sexually active and are younger than 72 years of age. You are older than 72 years of age and your   health care provider tells you that you are at risk for this type of infection. Your sexual activity has changed since you were last screened, and you are at increased risk for chlamydia or gonorrhea. Ask your health care provider if you are at risk. Ask your health care provider about whether you are at high risk for HIV. Your health care provider  may recommend a prescription medicine to help prevent HIV infection. If you choose to take medicine to prevent HIV, you should first get tested for HIV. You should then be tested every 3 months for as long as you are taking the medicine. Follow these instructions at home: Alcohol use Do not drink alcohol if your health care provider tells you not to drink. If you drink alcohol: Limit how much you have to 0-2 drinks a day. Know how much alcohol is in your drink. In the [place] one drink equals one 12 oz bottle of beer (355 mL), one 5 oz glass of wine (148 mL), or one 1 oz glass of hard liquor (44 mL). Lifestyle Do not use any products that contain nicotine or tobacco. These products include cigarettes, chewing tobacco, and vaping devices, such as e-cigarettes. If you need help quitting, ask your health care provider. Do not use street drugs. Do not share needles. Ask your health care provider for help if you need support or information about quitting drugs. General instructions Schedule regular health, dental, and eye exams. Stay current with your vaccines. Tell your health care provider if: You often feel depressed. You have ever been abused or do not feel safe at home. Summary Adopting a healthy lifestyle and getting preventive care are important in promoting health and wellness. Follow your health care provider's instructions about healthy diet, exercising, and getting tested or screened for diseases. Follow your health care provider's instructions on monitoring your cholesterol and blood pressure. This information is not intended to replace advice given to you by your health care provider. Make sure you discuss any questions you have with your health care provider. Document Revised: 10/18/2020 Document Reviewed: 10/18/2020 Elsevier Patient Education  2024 Elsevier Inc.  

## 2023-05-18 NOTE — Addendum Note (Signed)
Addended by: Filomena Jungling on: 05/18/2023 03:20 PM   Modules accepted: Orders

## 2023-05-19 LAB — LIPID PANEL
Cholesterol: 154 mg/dL (ref <200)
HDL: 40 mg/dL (ref >=40)
LDL Cholesterol (Calc): 83 mg/dL
Non-HDL Cholesterol (Calc): 114 mg/dL (ref <130)
Total CHOL/HDL Ratio: 3.9 (calc) (ref <5.0)
Triglycerides: 215 mg/dL — ABNORMAL HIGH (ref <150)

## 2023-05-19 LAB — COMPREHENSIVE METABOLIC PANEL
AG Ratio: 1.6 (calc) (ref 1.0–2.5)
ALT: 18 U/L (ref 9–46)
AST: 16 U/L (ref 10–35)
Albumin: 4.1 g/dL (ref 3.6–5.1)
Alkaline phosphatase (APISO): 80 U/L (ref 35–144)
BUN: 12 mg/dL (ref 7–25)
CO2: 28 mmol/L (ref 20–32)
Calcium: 9.4 mg/dL (ref 8.6–10.3)
Chloride: 98 mmol/L (ref 98–110)
Creat: 0.95 mg/dL (ref 0.70–1.28)
Globulin: 2.6 g/dL (ref 1.9–3.7)
Glucose, Bld: 94 mg/dL (ref 65–99)
Potassium: 4.9 mmol/L (ref 3.5–5.3)
Sodium: 133 mmol/L — ABNORMAL LOW (ref 135–146)
Total Bilirubin: 0.5 mg/dL (ref 0.2–1.2)
Total Protein: 6.7 g/dL (ref 6.1–8.1)

## 2023-05-19 LAB — CBC WITH DIFFERENTIAL/PLATELET
Absolute Lymphocytes: 1118 {cells}/uL (ref 850–3900)
Absolute Monocytes: 738 {cells}/uL (ref 200–950)
Basophils Absolute: 62 {cells}/uL (ref 0–200)
Basophils Relative: 0.9 %
Eosinophils Absolute: 138 {cells}/uL (ref 15–500)
Eosinophils Relative: 2 %
HCT: 43.4 % (ref 38.5–50.0)
Hemoglobin: 14.8 g/dL (ref 13.2–17.1)
MCH: 29.7 pg (ref 27.0–33.0)
MCHC: 34.1 g/dL (ref 32.0–36.0)
MCV: 87.1 fL (ref 80.0–100.0)
MPV: 10.8 fL (ref 7.5–12.5)
Monocytes Relative: 10.7 %
Neutro Abs: 4844 {cells}/uL (ref 1500–7800)
Neutrophils Relative %: 70.2 %
Platelets: 267 10*3/uL (ref 140–400)
RBC: 4.98 10*6/uL (ref 4.20–5.80)
RDW: 13.7 % (ref 11.0–15.0)
Total Lymphocyte: 16.2 %
WBC: 6.9 10*3/uL (ref 3.8–10.8)

## 2023-05-19 LAB — PSA: PSA: 1.69 ng/mL (ref <=4.00)

## 2023-05-20 ENCOUNTER — Other Ambulatory Visit: Payer: Self-pay | Admitting: Family Medicine

## 2023-08-09 ENCOUNTER — Telehealth: Payer: Self-pay

## 2023-08-09 MED ORDER — ALBUTEROL SULFATE HFA 108 (90 BASE) MCG/ACT IN AERS: 1.0000 | INHALATION_SPRAY | Freq: Four times a day (QID) | RESPIRATORY_TRACT | 1 refills | Status: AC | PRN

## 2023-08-09 NOTE — Telephone Encounter (Signed)
 Copied from CRM [id number]. Topic: Clinical - Medication Question >> Aug 08, 2023  3:01 PM Sim Boast F wrote: Reason for CRM: Patient request that his Albuterol 90 mcg/actuation inhaler be refilled, says it hasn't been filled for about 2 years. He would like the inhaler to help with asthma before outpatient neck surgery 08/21/23 with Dr. Noel Gerold.

## 2023-08-11 HISTORY — PX: CERVICAL SPINE SURGERY: SHX589

## 2023-09-27 ENCOUNTER — Telehealth: Payer: Self-pay | Admitting: Family Medicine

## 2023-09-27 NOTE — Telephone Encounter (Unsigned)
 Copied from CRM [id number]. Topic: Clinical - Medication Question >> Sep 27, 2023  5:03 PM Danae Duncans wrote: Reason for CRM: Patient advise surgeon recommend pcp to prescribe water pill to eliminate swelling in hand and feet. Pt would like to be prescribe water pill.

## 2023-10-09 ENCOUNTER — Encounter (INDEPENDENT_AMBULATORY_CARE_PROVIDER_SITE_OTHER): Payer: Self-pay

## 2023-10-09 ENCOUNTER — Ambulatory Visit (INDEPENDENT_AMBULATORY_CARE_PROVIDER_SITE_OTHER): Payer: Medicare HMO | Admitting: Otolaryngology

## 2023-10-09 VITALS — HR 102 | Ht 69.0 in | Wt 215.0 lb

## 2023-10-09 DIAGNOSIS — H9042 Sensorineural hearing loss, unilateral, left ear, with unrestricted hearing on the contralateral side: Secondary | ICD-10-CM

## 2023-10-09 DIAGNOSIS — H903 Sensorineural hearing loss, bilateral: Secondary | ICD-10-CM | POA: Insufficient documentation

## 2023-10-09 DIAGNOSIS — H6121 Impacted cerumen, right ear: Secondary | ICD-10-CM | POA: Diagnosis not present

## 2023-10-09 NOTE — Progress Notes (Unsigned)
 Patient ID: Robert Salzman., male   DOB: 04/04/1951, 73 y.o.   MRN: [id number]  CC: Asymmetric left ear sensorineural hearing loss  HPI:  Robert Monier. is a 73 y.o. male who presents today for evaluation of his asymmetric left ear sensorineural hearing loss.  According to the patient, he has noted bilateral progressive hearing difficulty for the past 2+ years.  He has noted increasing hearing difficulty, especially in noisy environments.  He was recently evaluated at [facility].  His hearing test shows bilateral high-frequency sensorineural hearing loss, worse on the left side.  The patient is not aware of his significant asymmetry.  Currently he denies any otalgia, otorrhea, or vertigo.  He has no previous otologic surgery.  He denies any recent otitis media or otitis externa.  Past Medical History:  Diagnosis Date   Arthritis    heredity - hip dysplasia, OA   Dyshydrosis    Essential hypertension    onset 2020 per prior pcp record   Family history of colon cancer in mother    rpt colonoscopy due 2026   Family history of prostate cancer in father    Hay fever    History of blood transfusion    autologus   History of colon polyps    rpt colonoscopy due 2026   History of diverticulitis    severe diverticulosis in [place] and [place] on 2016 and 2021 colonoscopies   Hyperlipidemia    Mild intermittent asthma    rare use of ventolin  inhaler   Obesity, Class I, BMI 30-34.9    Recurrent low back pain    +SI jt per pt (Murphy/Wainer)    Past Surgical History:  Procedure Laterality Date   APPENDECTOMY  1964   CARDIAC CATHETERIZATION  07/31/2003   LV norm. EF58%,norm coronaries.   CATARACT EXTRACTION Left 08/26/2021   CATARACT EXTRACTION Right 08/12/2021   CHOLECYSTECTOMY  2016   COLONOSCOPY     multiple (+FH CC and personal hx of polyps). 04/27/20 no polyps, recall 5 yrs ([facility], Dr. Katheryne Pane)   HEMORRHOID SURGERY     JOINT REPLACEMENT  1993   bilateral hip replacement    left knee surgery  2010   NASAL SINUS SURGERY     NM MYOCAR PERF WALL MOTION  03/07/2012   EF 57%  low risk scan   NM MYOCAR PERF WALL MOTION  10/22/2006   EF 53%    NM MYOCAR PERF WALL MOTION  07/09/2003   MILD ANTERIOR wall thinning w/poss. superimposed anterolateral wall isch .-midventricular apex distrib. of mid to distal LAD   REVISION TOTAL HIP ARTHROPLASTY     right - revision- 03/2012   TOTAL HIP REVISION  03/27/2012   Procedure: TOTAL HIP REVISION;  Surgeon: Ferd Householder, MD;  Location: [facility] OR;  Service: Orthopedics;  Laterality: Right;  with left knee cortisone injection   TOTAL HIP REVISION  05/15/2012   Procedure: TOTAL HIP REVISION;  Surgeon: Ferd Householder, MD;  Location: [facility] OR;  Service: Orthopedics;  Laterality: Left;   TOTAL KNEE ARTHROPLASTY Left 01/15/2013   Procedure: TOTAL KNEE ARTHROPLASTY- left ;  Surgeon: Ferd Householder, MD;  Location: [facility] OR;  Service: Orthopedics;  Laterality: Left;   VENTRAL HERNIA REPAIR  2018   mesh insertion; Surgeron: F Andreas Bandy, MD; Location: [facility] OR; Service;General; Laterality; N/A;   WISDOM TOOTH EXTRACTION      Family History  Problem Relation Age of Onset   Colon cancer Mother 76  Hyperlipidemia Mother    Prostate cancer Father    Leukemia Sister 4   Stroke Maternal Grandfather    Throat cancer Paternal Grandfather    Diabetes Brother     Social History:  reports that he has never smoked. He has never used smokeless tobacco. He reports that he does not drink alcohol and does not use drugs.  Allergies:  Allergies  Allergen Reactions   Sulfa Antibiotics     Childhood allergy   Sulfasalazine Other (See Comments)    unknown   Lisinopril  Cough    Prior to Admission medications   Medication Sig Start Date End Date Taking? Authorizing Provider  albuterol  (VENTOLIN  HFA) 108 (90 Base) MCG/ACT inhaler Inhale 1-2 puffs into the lungs every 6 (six) hours as needed for wheezing or shortness of breath. 08/09/23   Yes McGowen, Minetta Aly, MD  atorvastatin  (LIPITOR) 40 MG tablet Take 1 tablet (40 mg total) by mouth daily. 03/08/23  Yes McGowen, Minetta Aly, MD  diclofenac (VOLTAREN) 75 MG EC tablet Take 75 mg by mouth 2 (two) times daily as needed. 06/05/21  Yes [provider]  Fexofenadine-Pseudoephedrine (ALLEGRA-D PO) Take 1 tablet by mouth once a week.   Yes [provider]  fluticasone  (CUTIVATE ) 0.05 % cream Apply topically 2 (two) times daily. 05/18/23  Yes McGowen, Minetta Aly, MD  gabapentin (NEURONTIN) 300 MG capsule Take 1 capsule by mouth at bedtime. 06/30/20  Yes [provider]  GLUCOSAMINE-CHONDROITIN PO Take by mouth daily.   Yes [provider]  losartan  (COZAAR ) 50 MG tablet 1 tab po bid 05/18/23  Yes McGowen, Minetta Aly, MD    Pulse (!) 102, height 5\' 9"  (1.753 m), weight 215 lb (97.5 kg), SpO2 96%. Exam: General: Communicates without difficulty, well nourished, no acute distress. Head: Normocephalic, no evidence injury, no tenderness, facial buttresses intact without stepoff. Face/sinus: No tenderness to palpation and percussion. Facial movement is normal and symmetric. Eyes: PERRL, EOMI. No scleral icterus, conjunctivae clear. Neuro: CN II exam reveals vision grossly intact.  No nystagmus at any point of gaze. Ears: Auricles well formed without lesions.  Right ear cerumen impaction.  The left ear canal and tympanic membrane are normal.  Nose: External evaluation reveals normal support and skin without lesions.  Dorsum is intact.  Anterior rhinoscopy reveals congested mucosa over anterior aspect of inferior turbinates and intact septum.  No purulence noted. Oral:  Oral cavity and oropharynx are intact, symmetric, without erythema or edema.  Mucosa is moist without lesions. Neck: Full range of motion without pain.  There is no significant lymphadenopathy.  No masses palpable.  Thyroid bed within normal limits to palpation.  Parotid glands and submandibular glands equal  bilaterally without mass.  Trachea is midline. Neuro:  CN 2-12 grossly intact.   Procedure: Right ear cerumen disimpaction Anesthesia: None Description: Under the operating microscope, the cerumen is carefully removed with a combination of cerumen currette, alligator forceps, and suction catheters.  After the cerumen is removed, the TMs are noted to be normal.  No mass, erythema, or lesions. The patient tolerated the procedure well.    Assessment: 1.  Right ear cerumen impaction.  After the cerumen disimpaction procedure, both tympanic membranes and middle ear spaces are noted to be normal. 2.  Bilateral high-frequency sensorineural hearing loss, worse on the left side.  Plan: 1.  Otomicroscopy with right ear cerumen disimpaction. 2.  The physical exam findings and the hearing test results are reviewed with the patient. 3.  The  small possibility of a retrocochlear lesion causing the asymmetric hearing loss is discussed.  The options of conservative observation versus MRI scan are reviewed.  The patient would like to proceed with the MRI scan. 4.  The patient is a candidate for hearing amplification.  The hearing aid options are discussed.  He will follow-up with in hearing and audiology for hearing aid fitting. 5.  The patient will return for reevaluation after his MRI scan.  Kennedee Kitzmiller W Raiya Stainback 10/09/2023, 3:38 PM

## 2023-10-20 ENCOUNTER — Ambulatory Visit (HOSPITAL_COMMUNITY)
Admission: RE | Admit: 2023-10-20 | Discharge: 2023-10-20 | Disposition: A | Discharge status: Home / Self Care | Source: Ambulatory Visit | Attending: Otolaryngology | Admitting: Otolaryngology

## 2023-10-20 DIAGNOSIS — H903 Sensorineural hearing loss, bilateral: Secondary | ICD-10-CM | POA: Insufficient documentation

## 2023-10-20 MED ORDER — GADOBUTROL 1 MMOL/ML IV SOLN: 10.0000 mL | Freq: Once | INTRAVENOUS | Status: DC | PRN

## 2023-10-20 MED ORDER — GADOBUTROL 1 MMOL/ML IV SOLN
10.0000 mL | Freq: Once | INTRAVENOUS | Status: AC | PRN
  Administered 2023-10-20: 10 mL via INTRAVENOUS

## 2023-11-16 ENCOUNTER — Encounter: Payer: Self-pay | Admitting: Family Medicine

## 2023-11-16 ENCOUNTER — Ambulatory Visit (INDEPENDENT_AMBULATORY_CARE_PROVIDER_SITE_OTHER): Payer: Medicare HMO | Admitting: Family Medicine

## 2023-11-16 VITALS — BP 130/72 | HR 64 | Temp 97.8°F | Ht 69.0 in | Wt 211.0 lb

## 2023-11-16 DIAGNOSIS — M353 Polymyalgia rheumatica: Secondary | ICD-10-CM | POA: Diagnosis not present

## 2023-11-16 DIAGNOSIS — E7800 Pure hypercholesterolemia, unspecified: Secondary | ICD-10-CM | POA: Diagnosis not present

## 2023-11-16 DIAGNOSIS — M25512 Pain in left shoulder: Secondary | ICD-10-CM

## 2023-11-16 DIAGNOSIS — M25511 Pain in right shoulder: Secondary | ICD-10-CM

## 2023-11-16 DIAGNOSIS — I10 Essential (primary) hypertension: Secondary | ICD-10-CM | POA: Diagnosis not present

## 2023-11-16 DIAGNOSIS — G8929 Other chronic pain: Secondary | ICD-10-CM

## 2023-11-16 DIAGNOSIS — R29898 Other symptoms and signs involving the musculoskeletal system: Secondary | ICD-10-CM

## 2023-11-16 DIAGNOSIS — R7301 Impaired fasting glucose: Secondary | ICD-10-CM

## 2023-11-16 NOTE — Progress Notes (Addendum)
 OFFICE VISIT  11/16/2023  CC:  Chief Complaint  Patient presents with   Medical Management of Chronic Issues    Patient is a 73 y.o. male who presents for 38-month follow-up hypertension and hyperlipidemia A/P as of last visit: 1 hypertension, well-controlled taking one half of the 100 mg losartan  twice a day. Will prescribe new 50 mg tabs and take 1 twice daily. Electrolytes and creatinine today.   2  Hypercholesterolemia, doing well on atorvastatin  40 mg daily. Checking nonfasting lipid panel today as well as hepatic panel.  INTERIM HX: In December 2024 for Robert [name] had onset of neck pain radiating over the shoulders and some bilateral hand numbness. His surgeon, Dr. Faylene Hoots, did ACDF C5-6, C6-7 with iliac crest bone graft and allograft. Robert [name] states this all went well and he is recent 19-month follow-up visit with Dr. Faylene Hoots showed everything to be satisfactory. Robert [name] has significant bilateral shoulder osteoarthritis that has been been bothering him a lot lately.Robert [name]  His shoulder pains have continued and he says he feels considerable weakness in his arms and legs for the last 5 months or so.  He describes some pain in the trapezius and supraspinatus muscle regions as well as both shoulders.  No pain in the upper arms, forearms, low back, hips, or legs. Denies tremor or saddle anesthesia or loss of bowel or bladder function.  Denies focal weakness.  He has seen Dr. Dalldorf in the past for his shoulders and since Robert [name] shoulder pains have worsened he has arranged a visit with him next week to get injections in both shoulders. At the recent follow-up with Dr. Faylene Hoots he prescribed Robert [name] a 6-day prednisone  taper pack for his shoulders to bridge him until he got his shoulder injections with Dr. Dalldorf. Robert [name] took his first dose yesterday and already feels remarkable improvement in the shoulder pains as well as his energy level in his arms and legs.  Review of systems: No joint swelling, no  rash, no fevers, no abnormal weight loss, no chest pain or shortness of breath, no abdominal pain, no melena or hematochezia, no gross hematuria.  Home blood pressures have been normal.  Other than the recent prednisone  he has not had any new medications.   Past Medical History:  Diagnosis Date   Arthritis    heredity - hip dysplasia, OA   Dyshydrosis    Essential hypertension    onset 2020 per prior pcp record   Family history of colon cancer in mother    rpt colonoscopy due 2026   Family history of prostate cancer in father    Hay fever    History of blood transfusion    autologus   History of colon polyps    rpt colonoscopy due 2026   History of diverticulitis    severe diverticulosis in [place] and [place] on 2016 and 2021 colonoscopies   Hyperlipidemia    Mild intermittent asthma    rare use of ventolin  inhaler   Obesity, Class I, BMI 30-34.9    Recurrent low back pain    +SI jt per pt (Murphy/Wainer)    Past Surgical History:  Procedure Laterality Date   APPENDECTOMY  1964   CARDIAC CATHETERIZATION  07/31/2003   LV norm. EF58%,norm coronaries.   CATARACT EXTRACTION Left 08/26/2021   CATARACT EXTRACTION Right 08/12/2021   CERVICAL SPINE SURGERY  08/2023   ACDF C5-6, C6-7, with plate and bone graft (Dr. Faylene Hoots)   CHOLECYSTECTOMY  2016   COLONOSCOPY     multiple (+FH  CC and personal hx of polyps). 04/27/20 no polyps, recall 5 yrs ([facility], Dr. Katheryne Pane)   HEMORRHOID SURGERY     JOINT REPLACEMENT  1993   bilateral hip replacement   left knee surgery  2010   NASAL SINUS SURGERY     NM MYOCAR PERF WALL MOTION  03/07/2012   EF 57%  low risk scan   NM MYOCAR PERF WALL MOTION  10/22/2006   EF 53%    NM MYOCAR PERF WALL MOTION  07/09/2003   MILD ANTERIOR wall thinning w/poss. superimposed anterolateral wall isch .-midventricular apex distrib. of mid to distal LAD   REVISION TOTAL HIP ARTHROPLASTY     right - revision- 03/2012   TOTAL HIP REVISION  03/27/2012   Procedure: TOTAL  HIP REVISION;  Surgeon: Ferd Householder, MD;  Location: [facility] OR;  Service: Orthopedics;  Laterality: Right;  with left knee cortisone injection   TOTAL HIP REVISION  05/15/2012   Procedure: TOTAL HIP REVISION;  Surgeon: Ferd Householder, MD;  Location: [facility] OR;  Service: Orthopedics;  Laterality: Left;   TOTAL KNEE ARTHROPLASTY Left 01/15/2013   Procedure: TOTAL KNEE ARTHROPLASTY- left ;  Surgeon: Ferd Householder, MD;  Location: [facility] OR;  Service: Orthopedics;  Laterality: Left;   VENTRAL HERNIA REPAIR  2018   mesh insertion; Surgeron: F Andreas Bandy, MD; Location: [facility]; Service;General; Laterality; N/A;   WISDOM TOOTH EXTRACTION      Outpatient Medications Prior to Visit  Medication Sig Dispense Refill   atorvastatin  (LIPITOR) 40 MG tablet Take 1 tablet (40 mg total) by mouth daily. 90 tablet 3   diclofenac (VOLTAREN) 75 MG EC tablet Take 75 mg by mouth 2 (two) times daily as needed.     gabapentin (NEURONTIN) 300 MG capsule Take 1 capsule by mouth as needed.     GLUCOSAMINE-CHONDROITIN PO Take by mouth at bedtime.     Loratadine-Pseudoephedrine (CLARITIN-D 12 HOUR PO) Take by mouth as needed.     losartan  (COZAAR ) 50 MG tablet 1 tab po bid 180 tablet 3   albuterol  (VENTOLIN  HFA) 108 (90 Base) MCG/ACT inhaler Inhale 1-2 puffs into the lungs every 6 (six) hours as needed for wheezing or shortness of breath. (Patient not taking: Reported on 11/16/2023) 18 g 1   fluticasone  (CUTIVATE ) 0.05 % cream Apply topically 2 (two) times daily. (Patient not taking: Reported on 11/16/2023) 60 g 1   Fexofenadine-Pseudoephedrine (ALLEGRA-D PO) Take 1 tablet by mouth once a week.     No facility-administered medications prior to visit.    Allergies  Allergen Reactions   Sulfa Antibiotics     Childhood allergy   Sulfasalazine Other (See Comments)    unknown   Lisinopril  Cough    Review of Systems As per HPI  PE:    11/16/2023    1:39 PM 11/16/2023    1:05 PM 10/09/2023    3:08 PM  Vitals  with BMI  Height  5' 9 5' 9  Weight  211 lbs 215 lbs  BMI  31.15 31.74  Systolic 130 144   Diastolic 72 74   Pulse  64 102     Physical Exam  General: Alert and well-appearing. Cardiovascular: Regular rhythm and rate without murmur. Lungs are clear bilaterally, breathing nonlabored. Abdomen is soft and nontender. He has some mild discomfort to palpation of the soft tissues over the shoulders but otherwise is nontender.   Skin: No rash. Neuro: CN 2-12 intact bilaterally, strength 5/5 in proximal and distal  upper extremities and lower extremities bilaterally.   No tremor.  No ataxia.    LABS:  Last CBC Lab Results  Component Value Date   WBC 6.9 05/18/2023   HGB 14.8 05/18/2023   HCT 43.4 05/18/2023   MCV 87.1 05/18/2023   MCH 29.7 05/18/2023   RDW 13.7 05/18/2023   PLT 267 05/18/2023   Last metabolic panel Lab Results  Component Value Date   GLUCOSE 94 05/18/2023   NA 133 (L) 05/18/2023   K 4.9 05/18/2023   CL 98 05/18/2023   CO2 28 05/18/2023   BUN 12 05/18/2023   CREATININE 0.95 05/18/2023   GFRNONAA 62 05/21/2020   CALCIUM  9.4 05/18/2023   PROT 6.7 05/18/2023   ALBUMIN  4.1 04/29/2020   BILITOT 0.5 05/18/2023   ALKPHOS 77 07/01/2020   AST 16 05/18/2023   ALT 18 05/18/2023   Last lipids Lab Results  Component Value Date   CHOL 154 05/18/2023   HDL 40 05/18/2023   LDLCALC 83 05/18/2023   TRIG 215 (H) 05/18/2023   CHOLHDL 3.9 05/18/2023   Last thyroid functions Lab Results  Component Value Date   TSH 2.39 05/28/2007   Last vitamin B12 and Folate Lab Results  Component Value Date   VITAMINB12 609 05/28/2007   FOLATE 14.0 05/28/2007   Lab Results  Component Value Date   PSA 1.69 05/18/2023   PSA 1.88 04/28/2022   PSA 2.00 04/27/2021   IMPRESSION AND PLAN:  #1 myalgias over the trapezius and supraspinatus regions as well as more anterolaterally on the shoulders bilaterally.  He also has generalized upper extremity and lower extremity  subjective weakness. Remarkable improvement with prednisone .  His presentation is suspicious for polymyalgia rheumatica. Check CBC, c-Met, sed rate, CRP, and TSH.  He will finish his prednisone  taper over the next 4 days and we will see how he does clinically as he comes off this medication.  Recheck in office 1 week.  2. cervical stenosis.  He is status post successful decompression and fixation surgery.  #3 bilateral shoulder pain.  Hard to tell how much of this is from his arthritis versus PMR. Discussed in detail today. Decided to hold off on his plan of getting shoulder injections next week. Will see how he is doing clinically, see if were going to keep him on prednisone  plus minus referral to rheumatology. Can reapproach management of his shoulder arthritis when the picture becomes a bit more clear. Will do a detailed shoulder exam and MSK ultrasound of shoulders at next follow-up visit in 1 week.  #4 hypertension, well-controlled on losartan  50 mg twice daily.  #5 hypercholesterolemia, good long-term control on Lipitor 40 mg a day. Fasting lipid panel today.  An After Visit Summary was printed and given to the patient.  Spent 43 min with pt today reviewing HPI, reviewing relevant past history, doing exam, reviewing and discussing lab and imaging data, and formulating plans.  FOLLOW UP: Return in about 1 week (around 11/23/2023) for f/u weakness.  Signed:  Arletha Lady, MD           11/16/2023

## 2023-11-19 ENCOUNTER — Ambulatory Visit: Payer: Self-pay | Admitting: Family Medicine

## 2023-11-21 LAB — COMPREHENSIVE METABOLIC PANEL WITH GFR
AG Ratio: 1.5 (calc) (ref 1.0–2.5)
ALT: 18 U/L (ref 9–46)
AST: 13 U/L (ref 10–35)
Albumin: 4.4 g/dL (ref 3.6–5.1)
Alkaline phosphatase (APISO): 88 U/L (ref 35–144)
BUN: 15 mg/dL (ref 7–25)
CO2: 27 mmol/L (ref 20–32)
Calcium: 10.3 mg/dL (ref 8.6–10.3)
Chloride: 99 mmol/L (ref 98–110)
Creat: 0.74 mg/dL (ref 0.70–1.28)
Globulin: 3 g/dL (ref 1.9–3.7)
Glucose, Bld: 116 mg/dL — ABNORMAL HIGH (ref 65–99)
Potassium: 5.4 mmol/L — ABNORMAL HIGH (ref 3.5–5.3)
Sodium: 138 mmol/L (ref 135–146)
Total Bilirubin: 0.4 mg/dL (ref 0.2–1.2)
Total Protein: 7.4 g/dL (ref 6.1–8.1)
eGFR: 96 mL/min/{1.73_m2} (ref >=60)

## 2023-11-21 LAB — CBC WITH DIFFERENTIAL/PLATELET
Absolute Lymphocytes: 1317 {cells}/uL (ref 850–3900)
Absolute Monocytes: 579 {cells}/uL (ref 200–950)
Basophils Absolute: 53 {cells}/uL (ref 0–200)
Basophils Relative: 0.6 %
Eosinophils Absolute: 9 {cells}/uL — ABNORMAL LOW (ref 15–500)
Eosinophils Relative: 0.1 %
HCT: 44.1 % (ref 38.5–50.0)
Hemoglobin: 14.1 g/dL (ref 13.2–17.1)
MCH: 28.2 pg (ref 27.0–33.0)
MCHC: 32 g/dL (ref 32.0–36.0)
MCV: 88.2 fL (ref 80.0–100.0)
MPV: 10.7 fL (ref 7.5–12.5)
Monocytes Relative: 6.5 %
Neutro Abs: 6942 {cells}/uL (ref 1500–7800)
Neutrophils Relative %: 78 %
Platelets: 379 10*3/uL (ref 140–400)
RBC: 5 10*6/uL (ref 4.20–5.80)
RDW: 14.2 % (ref 11.0–15.0)
Total Lymphocyte: 14.8 %
WBC: 8.9 10*3/uL (ref 3.8–10.8)

## 2023-11-21 LAB — LIPID PANEL
Cholesterol: 170 mg/dL (ref <200)
HDL: 50 mg/dL (ref >=40)
LDL Cholesterol (Calc): 100 mg/dL — ABNORMAL HIGH
Non-HDL Cholesterol (Calc): 120 mg/dL (ref <130)
Total CHOL/HDL Ratio: 3.4 (calc) (ref <5.0)
Triglycerides: 107 mg/dL (ref <150)

## 2023-11-21 LAB — C-REACTIVE PROTEIN: CRP: 5.5 mg/L (ref <8.0)

## 2023-11-21 LAB — TSH: TSH: 1.97 m[IU]/L (ref 0.40–4.50)

## 2023-11-21 LAB — HEMOGLOBIN A1C W/OUT EAG: Hgb A1c MFr Bld: 6 % — ABNORMAL HIGH (ref <5.7)

## 2023-11-21 LAB — SEDIMENTATION RATE: Sed Rate: 6 mm/h (ref 0–20)

## 2023-11-22 ENCOUNTER — Ambulatory Visit (INDEPENDENT_AMBULATORY_CARE_PROVIDER_SITE_OTHER): Admitting: Otolaryngology

## 2023-11-22 ENCOUNTER — Encounter (INDEPENDENT_AMBULATORY_CARE_PROVIDER_SITE_OTHER): Payer: Self-pay | Admitting: Otolaryngology

## 2023-11-22 VITALS — HR 90 | Ht 69.0 in | Wt 210.0 lb

## 2023-11-22 DIAGNOSIS — H903 Sensorineural hearing loss, bilateral: Secondary | ICD-10-CM | POA: Diagnosis not present

## 2023-11-23 ENCOUNTER — Ambulatory Visit (INDEPENDENT_AMBULATORY_CARE_PROVIDER_SITE_OTHER): Admitting: Family Medicine

## 2023-11-23 ENCOUNTER — Encounter: Payer: Self-pay | Admitting: Family Medicine

## 2023-11-23 VITALS — BP 115/73 | HR 83 | Temp 98.0°F | Ht 69.0 in | Wt 210.8 lb

## 2023-11-23 DIAGNOSIS — R7303 Prediabetes: Secondary | ICD-10-CM

## 2023-11-23 DIAGNOSIS — R200 Anesthesia of skin: Secondary | ICD-10-CM | POA: Diagnosis not present

## 2023-11-23 DIAGNOSIS — M7918 Myalgia, other site: Secondary | ICD-10-CM

## 2023-11-23 NOTE — Progress Notes (Signed)
 OFFICE VISIT  11/23/2023  CC:  Chief Complaint  Patient presents with   Follow-up    1 week f/u weakness, pt states doing okay. Had recent [facility] visit 6/11 (Dr.Brumfield); XR completed for bilat shoulder pain, going back Monday for R shoulder injection and planning L shoulder total replacement (TBD)    Patient is a 73 y.o. male who presents for 1 week follow-up musculoskeletal pain and generalized weakness. A/P as of last visit: #1 myalgias over the trapezius and supraspinatus regions as well as more anterolaterally on the shoulders bilaterally.  He also has generalized upper extremity and lower extremity subjective weakness. Remarkable improvement with prednisone .   His presentation is suspicious for polymyalgia rheumatica. Check CBC, c-Met, sed rate, CRP, and TSH.   He will finish his prednisone  taper over the next 4 days and we will see how he does clinically as he comes off this medication.  Recheck in office 1 week.   2. cervical stenosis.  He is status post successful decompression and fixation surgery.   #3 bilateral shoulder pain.  Hard to tell how much of this is from his arthritis versus PMR. Discussed in detail today. Decided to hold off on his plan of getting shoulder injections next week. Will see how he is doing clinically, see if were going to keep him on prednisone  plus minus referral to rheumatology. Can reapproach management of his shoulder arthritis when the picture becomes a bit more clear. Will do a detailed shoulder exam and MSK ultrasound of shoulders at next follow-up visit in 1 week.   #4 hypertension, well-controlled on losartan  50 mg twice daily.   #5 hypercholesterolemia, good long-term control on Lipitor 40 mg a day. Fasting lipid panel today.  INTERIM HX: Fasting glucose 116 and hemoglobin A1c 6.0% last visit. Potassium 5.4. Sed rate was 6.  Overall Robert [name] is feeling much better compared to last week.  He finished the prednisone  a couple of days  ago and has noted minimal return of his pain. Generalized weakness is mild.  He feels like he notices numbness and tingling in the 3rd and 4th fingers on both hands more when he is off prednisone  compared to when he takes it. He was having these symptoms prior to his neck surgery in March but feels like it is even worse now. Denies weakness in hands or fingers. Has a bit of a similar sensation in the feet.  Review of systems: No focal weakness.  No fever or rash. No oral ulcers or eye redness. No headaches.  No joint swelling.  No polyuria or polydipsia.  Past Medical History:  Diagnosis Date   Arthritis    heredity - hip dysplasia, OA   Dyshydrosis    Essential hypertension    onset 2020 per prior pcp record   Family history of colon cancer in mother    rpt colonoscopy due 2026   Family history of prostate cancer in father    Hay fever    History of blood transfusion    autologus   History of colon polyps    rpt colonoscopy due 2026   History of diverticulitis    severe diverticulosis in [place] and  on 2016 and 2021 colonoscopies   Hyperlipidemia    Mild intermittent asthma    rare use of ventolin  inhaler   Obesity, Class I, BMI 30-34.9    Recurrent low back pain    +SI jt per pt (Murphy/Wainer)    Past Surgical History:  Procedure Laterality Date  APPENDECTOMY  1964   CARDIAC CATHETERIZATION  07/31/2003   LV norm. EF58%,norm coronaries.   CATARACT EXTRACTION Left 08/26/2021   CATARACT EXTRACTION Right 08/12/2021   CERVICAL SPINE SURGERY  08/2023   ACDF C5-6, C6-7, with plate and bone graft (Dr. Faylene Hoots)   CHOLECYSTECTOMY  2016   COLONOSCOPY     multiple (+FH CC and personal hx of polyps). 04/27/20 no polyps, recall 5 yrs ([facility], Dr. Katheryne Pane)   HEMORRHOID SURGERY     JOINT REPLACEMENT  1993   bilateral hip replacement   left knee surgery  2010   NASAL SINUS SURGERY     NM MYOCAR PERF WALL MOTION  03/07/2012   EF 57%  low risk scan   NM MYOCAR PERF WALL MOTION   10/22/2006   EF 53%    NM MYOCAR PERF WALL MOTION  07/09/2003   MILD ANTERIOR wall thinning w/poss. superimposed anterolateral wall isch .-midventricular apex distrib. of mid to distal LAD   REVISION TOTAL HIP ARTHROPLASTY     right - revision- 03/2012   TOTAL HIP REVISION  03/27/2012   Procedure: TOTAL HIP REVISION;  Surgeon: Ferd Householder, MD;  Location: [facility] OR;  Service: Orthopedics;  Laterality: Right;  with left knee cortisone injection   TOTAL HIP REVISION  05/15/2012   Procedure: TOTAL HIP REVISION;  Surgeon: Ferd Householder, MD;  Location: [facility] OR;  Service: Orthopedics;  Laterality: Left;   TOTAL KNEE ARTHROPLASTY Left 01/15/2013   Procedure: TOTAL KNEE ARTHROPLASTY- left ;  Surgeon: Ferd Householder, MD;  Location: [facility] OR;  Service: Orthopedics;  Laterality: Left;   VENTRAL HERNIA REPAIR  2018   mesh insertion; Surgeron: F Andreas Bandy, MD; Location: [facility] OR; Service;General; Laterality; N/A;   WISDOM TOOTH EXTRACTION      Outpatient Medications Prior to Visit  Medication Sig Dispense Refill   albuterol  (VENTOLIN  HFA) 108 (90 Base) MCG/ACT inhaler Inhale 1-2 puffs into the lungs every 6 (six) hours as needed for wheezing or shortness of breath. 18 g 1   atorvastatin  (LIPITOR) 40 MG tablet Take 1 tablet (40 mg total) by mouth daily. 90 tablet 3   diclofenac (VOLTAREN) 75 MG EC tablet Take 75 mg by mouth 2 (two) times daily as needed.     fluticasone  (CUTIVATE ) 0.05 % cream Apply topically 2 (two) times daily. 60 g 1   gabapentin (NEURONTIN) 300 MG capsule Take 1 capsule by mouth as needed.     GLUCOSAMINE-CHONDROITIN PO Take by mouth at bedtime.     Loratadine-Pseudoephedrine (CLARITIN-D 12 HOUR PO) Take by mouth as needed.     losartan  (COZAAR ) 50 MG tablet 1 tab po bid 180 tablet 3   No facility-administered medications prior to visit.    Allergies  Allergen Reactions   Sulfa Antibiotics     Childhood allergy   Sulfasalazine Other (See Comments)    unknown    Lisinopril  Cough    Review of Systems As per HPI  PE:    11/23/2023   11:36 AM 11/22/2023    1:15 PM 11/16/2023    1:39 PM  Vitals with BMI  Height 5' 9 5' 9   Weight 210 lbs 13 oz 210 lbs   BMI 31.12 31   Systolic 115  130  Diastolic 73  72  Pulse 83 90      Physical Exam  Gen: Alert, well appearing.  Patient is oriented to person, place, time, and situation. AFFECT: pleasant, lucid thought and speech. No further  exam today  LABS:  Last CBC Lab Results  Component Value Date   WBC 8.9 11/16/2023   HGB 14.1 11/16/2023   HCT 44.1 11/16/2023   MCV 88.2 11/16/2023   MCH 28.2 11/16/2023   RDW 14.2 11/16/2023   PLT 379 11/16/2023   Last metabolic panel Lab Results  Component Value Date   GLUCOSE 116 (H) 11/16/2023   NA 138 11/16/2023   K 5.4 (H) 11/16/2023   CL 99 11/16/2023   CO2 27 11/16/2023   BUN 15 11/16/2023   CREATININE 0.74 11/16/2023   EGFR 96 11/16/2023   CALCIUM  10.3 11/16/2023   PROT 7.4 11/16/2023   ALBUMIN  4.1 04/29/2020   BILITOT 0.4 11/16/2023   ALKPHOS 77 07/01/2020   AST 13 11/16/2023   ALT 18 11/16/2023   Last lipids Lab Results  Component Value Date   CHOL 170 11/16/2023   HDL 50 11/16/2023   LDLCALC 100 (H) 11/16/2023   TRIG 107 11/16/2023   CHOLHDL 3.4 11/16/2023   Last hemoglobin A1c Lab Results  Component Value Date   HGBA1C 6.0 (H) 11/16/2023   Last thyroid functions Lab Results  Component Value Date   TSH 1.97 11/16/2023   Lab Results  Component Value Date   ESRSEDRATE 6 11/16/2023   IMPRESSION AND PLAN:  #1 myalgias and generalized weakness. Sed rate was normal, making polymyalgia rheumatica unlikely despite his significant improvement when he takes prednisone . It seems that his myalgias may actually be some upper back and neck muscles that are stressed and compensating for the chronic pain he feels in both shoulders from osteoarthritis. At any rate, no further prednisone  at this time. He will continue  diclofenac on an as-needed basis. He does have some general deconditioning that he is going to start working on.  #2 prediabetes. Hemoglobin A1c was 6.0%. We discussed this today.  Discussed dietary and exercise recommendations. Repeat hemoglobin A1c in 6 months.  3.  Severe left shoulder osteoarthritis. He will be getting left shoulder replacement towards the end of the summer by Dr. Alda Hummer.  He is going to undergo an MRI soon to assess the rotator cuff and help determine whether he needs reverse arthroplasty versus traditional arthroplasty.  4.  Right shoulder AC joint arthritis. He is going to go back to Dr. Johnna Nakai office soon to get an [facility] joint injection.  5.  Bilateral hand numbness, focused in the 3rd and 4th digits bilaterally. Suspect residual spinal nerve damage from his cervical spinal stenosis that will gradually resolve. He is status post successful ACDF C5-6 and C6-7 with plate, iliac crest bone graft and allograft by Dr. Faylene Hoots  An After Visit Summary was printed and given to the patient.  FOLLOW UP: Return in about 6 months (around 05/24/2024) for annual CPE (fasting).  Signed:  Arletha Lady, MD           11/23/2023

## 2023-11-24 DIAGNOSIS — M19019 Primary osteoarthritis, unspecified shoulder: Secondary | ICD-10-CM | POA: Insufficient documentation

## 2023-11-24 DIAGNOSIS — M12812 Other specific arthropathies, not elsewhere classified, left shoulder: Secondary | ICD-10-CM | POA: Insufficient documentation

## 2023-11-24 NOTE — Progress Notes (Signed)
 Patient ID: Robert Mane., male   DOB: [birth year], 73 y.o.   MRN: [id number]  Follow-up: Asymmetric left ear hearing loss  HPI: The patient is a 73 year old male who returns today for his follow-up evaluation.  The patient was last seen in April 2025.  At that time, he was complaining of bilateral progressive hearing difficulty.  His hearing test at [facility] showed bilateral high-frequency sensorineural hearing loss, worse on the left side.  His ear canals, tympanic membranes, and middle ear spaces were all normal.  His subsequent MRI scan was negative for retrocochlear lesion.  The patient returns today reporting no significant change in his hearing.  He denies any otalgia, otorrhea, or vertigo.  Exam: General: Communicates without difficulty, well nourished, no acute distress. Head: Normocephalic, no evidence injury, no tenderness, facial buttresses intact without stepoff. Face/sinus: No tenderness to palpation and percussion. Facial movement is normal and symmetric. Eyes: PERRL, EOMI. No scleral icterus, conjunctivae clear. Neuro: CN II exam reveals vision grossly intact.  No nystagmus at any point of gaze. Ears: Auricles well formed without lesions.  Ear canals are intact without mass or lesion.  No erythema or edema is appreciated.  The TMs are intact without fluid. Nose: External evaluation reveals normal support and skin without lesions.  Dorsum is intact.  Anterior rhinoscopy reveals normal mucosa over anterior aspect of inferior turbinates and intact septum.  No purulence noted. Oral:  Oral cavity and oropharynx are intact, symmetric, without erythema or edema.  Mucosa is moist without lesions. Neck: Full range of motion without pain.  There is no significant lymphadenopathy.  No masses palpable.  Thyroid bed within normal limits to palpation.  Parotid glands and submandibular glands equal bilaterally without mass.  Trachea is midline. Neuro:  CN 2-12 grossly intact.    Assessment: 1.  Bilateral high-frequency sensorineural hearing loss, she has asymmetry on the left side. 2.  His MRI scan is negative for retrocochlear lesion.  Plan: 1.  The physical exam findings and the MRI results are reviewed with the patient. 2.  The patient is a candidate for hearing amplification.  The hearing aid options are discussed. 3.  The patient will return for reevaluation in 1 year.  We will repeat his hearing test at that time.

## 2023-11-26 DIAGNOSIS — M542 Cervicalgia: Secondary | ICD-10-CM | POA: Insufficient documentation

## 2023-12-05 ENCOUNTER — Ambulatory Visit: Payer: Self-pay

## 2023-12-05 NOTE — Telephone Encounter (Signed)
 FYI Only or Action Required?: FYI only for provider.  Patient was last seen in primary care on 11/23/2023 by McGowen, Aleene DEL, MD. Called Nurse Triage reporting Hemorrhoids. Symptoms began several days ago. Interventions attempted: OTC medications: nsaids and preparation H. Symptoms are: unchanged.  Triage Disposition: See PCP When Office is Open (Within 3 Days)  Patient/caregiver understands and will follow disposition?: Yes  Offered appt for today but he has an MRI scheduled today, so booked for next available on Friday.                 Copied from CRM [id number]. Topic: Clinical - Red Word Triage >> Dec 05, 2023 10:36 AM Viola F wrote: Patient has been having pain and swelling for the past two days, says he thinks his hemorrhoids are flaring up and requested an appointment for today Reason for Disposition  [1] Home treatment > 3 days for rectal pain AND [2] not improved  Answer Assessment - Initial Assessment Questions 1. SYMPTOM:  What's the main symptom you're concerned about? (e.g., pain, itching, swelling, rash)     Pain and swelling 2. ONSET: When did the pain  start?     2 days 3. RECTAL PAIN: Do you have any pain around your rectum? How bad is the pain?  (Scale 0-10; or mild, moderate, severe)   - NONE (0): no pain   - MILD (1-3): doesn't interfere with normal activities    - MODERATE (4-7): interferes with normal activities or awakens from sleep, limping    - SEVERE (8-10): excruciating pain, unable to have a bowel movement      9.5 4. RECTAL ITCHING: Do you have any itching in this area? How bad is the itching?  (Scale 0-10; or mild, moderate, severe)   - NONE: no itching   - MILD: doesn't interfere with normal activities    - MODERATE-SEVERE: interferes with normal activities or awakens from sleep     no 5. CONSTIPATION: Do you have constipation? If Yes, ask: How bad is it?     Was constipated before  but yesterday stool.  6. CAUSE: What  do you think is causing the anus symptoms?     hemorrhoids 7. OTHER SYMPTOMS: Do you have any other symptoms?  (e.g., abdomen pain, fever, rectal bleeding, vomiting)     Nausea, blood on tissue yesterday  Protocols used: Rectal Symptoms-A-AH

## 2023-12-07 ENCOUNTER — Ambulatory Visit (INDEPENDENT_AMBULATORY_CARE_PROVIDER_SITE_OTHER): Admitting: Family Medicine

## 2023-12-07 ENCOUNTER — Encounter: Payer: Self-pay | Admitting: Family Medicine

## 2023-12-07 VITALS — BP 131/81 | HR 104 | Temp 98.2°F | Wt 209.8 lb

## 2023-12-07 DIAGNOSIS — K644 Residual hemorrhoidal skin tags: Secondary | ICD-10-CM

## 2023-12-07 DIAGNOSIS — L304 Erythema intertrigo: Secondary | ICD-10-CM | POA: Diagnosis not present

## 2023-12-07 MED ORDER — HYDROCORTISONE (PERIANAL) 2.5 % EX CREA: 1.0000 | TOPICAL_CREAM | Freq: Two times a day (BID) | CUTANEOUS | 1 refills | Status: AC

## 2023-12-07 NOTE — Progress Notes (Signed)
 OFFICE VISIT  12/07/2023  CC:  Chief Complaint  Patient presents with   Rectal Pain    Patient is a 73 y.o. male who presents for rectal pain.  HPI: Onset 3 days ago pain in anal area, focal swelling consistent with hemorrhoids.  Small amount of bright red blood on the first day but none since. He had 2 days of significant constipation prior to this. History of external hemorrhoids, usually has 1-2 flareups per year and usually they have resolved in a couple of days at the most. Discomfort in the entire gluteal cleft region.  Sitz bath burned.  Application of Preparation H burns. Ice helps. Currently using Neosporin ointment.  Past Medical History:  Diagnosis Date   Arthritis    heredity - hip dysplasia, OA   Dyshydrosis    Essential hypertension    onset 2020 per prior pcp record   Family history of colon cancer in mother    rpt colonoscopy due 2026   Family history of prostate cancer in father    Hay fever    History of blood transfusion    autologus   History of colon polyps    rpt colonoscopy due 2026   History of diverticulitis    severe diverticulosis in [place] and [place] on 2016 and 2021 colonoscopies   Hyperlipidemia    Mild intermittent asthma    rare use of ventolin  inhaler   Obesity, Class I, BMI 30-34.9    Recurrent low back pain    +SI jt per pt (Murphy/Wainer)    Past Surgical History:  Procedure Laterality Date   APPENDECTOMY  1964   CARDIAC CATHETERIZATION  07/31/2003   LV norm. EF58%,norm coronaries.   CATARACT EXTRACTION Left 08/26/2021   CATARACT EXTRACTION Right 08/12/2021   CERVICAL SPINE SURGERY  08/2023   ACDF C5-6, C6-7, with plate and bone graft (Dr. Gust)   CHOLECYSTECTOMY  2016   COLONOSCOPY     multiple (+FH CC and personal hx of polyps). 04/27/20 no polyps, recall 5 yrs ([facility], Dr. Lisabeth)   HEMORRHOID SURGERY     JOINT REPLACEMENT  1993   bilateral hip replacement   left knee surgery  2010   NASAL SINUS SURGERY     NM MYOCAR PERF  WALL MOTION  03/07/2012   EF 57%  low risk scan   NM MYOCAR PERF WALL MOTION  10/22/2006   EF 53%    NM MYOCAR PERF WALL MOTION  07/09/2003   MILD ANTERIOR wall thinning w/poss. superimposed anterolateral wall isch .-midventricular apex distrib. of mid to distal LAD   REVISION TOTAL HIP ARTHROPLASTY     right - revision- 03/2012   TOTAL HIP REVISION  03/27/2012   Procedure: TOTAL HIP REVISION;  Surgeon: Toribio JULIANNA Chancy, MD;  Location: [facility] OR;  Service: Orthopedics;  Laterality: Right;  with left knee cortisone injection   TOTAL HIP REVISION  05/15/2012   Procedure: TOTAL HIP REVISION;  Surgeon: Toribio JULIANNA Chancy, MD;  Location: [facility] OR;  Service: Orthopedics;  Laterality: Left;   TOTAL KNEE ARTHROPLASTY Left 01/15/2013   Procedure: TOTAL KNEE ARTHROPLASTY- left ;  Surgeon: Toribio JULIANNA Chancy, MD;  Location: [facility] OR;  Service: Orthopedics;  Laterality: Left;   VENTRAL HERNIA REPAIR  2018   mesh insertion; Surgeron: F Vicenta Angst, MD; Location: [facility]; Service;General; Laterality; N/A;   WISDOM TOOTH EXTRACTION      Outpatient Medications Prior to Visit  Medication Sig Dispense Refill   albuterol  (VENTOLIN  HFA) 108 (90  Base) MCG/ACT inhaler Inhale 1-2 puffs into the lungs every 6 (six) hours as needed for wheezing or shortness of breath. 18 g 1   atorvastatin  (LIPITOR) 40 MG tablet Take 1 tablet (40 mg total) by mouth daily. 90 tablet 3   diclofenac (VOLTAREN) 75 MG EC tablet Take 75 mg by mouth 2 (two) times daily as needed.     fluticasone  (CUTIVATE ) 0.05 % cream Apply topically 2 (two) times daily. 60 g 1   gabapentin (NEURONTIN) 300 MG capsule Take 1 capsule by mouth as needed.     GLUCOSAMINE-CHONDROITIN PO Take by mouth at bedtime.     Loratadine-Pseudoephedrine (CLARITIN-D 12 HOUR PO) Take by mouth as needed.     losartan  (COZAAR ) 50 MG tablet 1 tab po bid 180 tablet 3   No facility-administered medications prior to visit.    Allergies  Allergen Reactions   Sulfa  Antibiotics     Childhood allergy   Sulfasalazine Other (See Comments)    unknown   Lisinopril  Cough    Review of Systems  As per HPI  PE:    12/07/2023    3:56 PM 12/07/2023    3:55 PM 11/23/2023   11:36 AM  Vitals with BMI  Height   5' 9  Weight  209 lbs 13 oz 210 lbs 13 oz  BMI  30.97 31.12  Systolic 131 142 884  Diastolic 81 83 73  Pulse  104 83     Physical Exam  Gen: Alert, well appearing.  Patient is oriented to person, place, time, and situation. AFFECT: pleasant, lucid thought and speech. Anal exam: 2 large external hemorrhoids that are very tender to touch.  Gluteal cleft region with general pinkish irritation with a few areas of maceration.   LABS:  Last CBC Lab Results  Component Value Date   WBC 8.9 11/16/2023   HGB 14.1 11/16/2023   HCT 44.1 11/16/2023   MCV 88.2 11/16/2023   MCH 28.2 11/16/2023   RDW 14.2 11/16/2023   PLT 379 11/16/2023   Last metabolic panel Lab Results  Component Value Date   GLUCOSE 116 (H) 11/16/2023   NA 138 11/16/2023   K 5.4 (H) 11/16/2023   CL 99 11/16/2023   CO2 27 11/16/2023   BUN 15 11/16/2023   CREATININE 0.74 11/16/2023   EGFR 96 11/16/2023   CALCIUM  10.3 11/16/2023   PROT 7.4 11/16/2023   ALBUMIN  4.1 04/29/2020   BILITOT 0.4 11/16/2023   ALKPHOS 77 07/01/2020   AST 13 11/16/2023   ALT 18 11/16/2023   IMPRESSION AND PLAN:  External hemorrhoids, likely thrombosed. He has secondary intertrigo. Anusol HC 2.5% cream prescribed, apply small amount to the hemorrhoids twice a day. Recommended A&D ointment to the area of intertrigo several times per day. Allowed to be open to the air/dry as able.  An After Visit Summary was printed and given to the patient.  FOLLOW UP: Return if symptoms worsen or fail to improve.  Signed:  Gerlene Hockey, MD           12/07/2023

## 2023-12-11 HISTORY — PX: TOTAL SHOULDER ARTHROPLASTY: SHX126

## 2023-12-19 ENCOUNTER — Ambulatory Visit: Admitting: Family Medicine

## 2023-12-19 ENCOUNTER — Encounter: Payer: Self-pay | Admitting: Family Medicine

## 2023-12-19 VITALS — BP 136/72 | HR 72 | Temp 97.4°F | Ht 69.0 in | Wt 208.8 lb

## 2023-12-19 DIAGNOSIS — K611 Rectal abscess: Secondary | ICD-10-CM | POA: Diagnosis not present

## 2023-12-19 MED ORDER — AMOXICILLIN-POT CLAVULANATE 875-125 MG PO TABS: 1.0000 | ORAL_TABLET | Freq: Two times a day (BID) | ORAL | 0 refills | Status: AC

## 2023-12-19 NOTE — Progress Notes (Signed)
 12/19/2023  CC:  Chief Complaint  Patient presents with   Follow-up    Post Op surgery; left total shoulder arthroplasty    Patient is a 73 y.o.  male who presents for  hospital follow up [facility]).. Dates hospitalized: 6/28 to 12/11/2023 Days since d/c from hospital: 8 days Patient was discharged from hospital to home. Reason for admission to hospital: Perirectal abscess  I have reviewed patient's discharge summary plus pertinent specific notes, labs, and imaging from the hospitalization.    Currently: Feeling much better.  Just a little bit of soreness in the anal region. No seepage.  The dressing has fallen out of each of the 6 small incisions. No fevers or malaise.  He took 5 days of Augmentin  upon discharge from the hospital.  Medication reconciliation was done today and patient is taking meds as recommended by discharging hospitalist/specialist.   Discharge medication list: Vicodin 5-3 25, albuterol  inhaler, aspirin  81 mg, atorvastatin  40 mg a day, Voltaren 75 mg twice a day as needed, fish oil caps daily, fluticasone  cream twice daily as needed, gabapentin 300 mg every morning as needed, losartan  50 mg twice daily, Pepcid AC 10 mg daily as needed.  PMH:  Past Medical History:  Diagnosis Date   Arthritis    heredity - hip dysplasia, OA   Dyshydrosis    Essential hypertension    onset 2020 per prior pcp record   Family history of colon cancer in mother    rpt colonoscopy due 2026   Family history of prostate cancer in father    Hay fever    History of blood transfusion    autologus   History of colon polyps    rpt colonoscopy due 2026   History of diverticulitis    severe diverticulosis in [place] and [place] on 2016 and 2021 colonoscopies   Hyperlipidemia    Mild intermittent asthma    rare use of ventolin  inhaler   Obesity, Class I, BMI 30-34.9    Recurrent low back pain    +SI jt per pt (Murphy/Wainer)    PSH:  Past Surgical History:  Procedure Laterality Date    APPENDECTOMY  1964   CARDIAC CATHETERIZATION  07/31/2003   LV norm. EF58%,norm coronaries.   CATARACT EXTRACTION Left 08/26/2021   CATARACT EXTRACTION Right 08/12/2021   CERVICAL SPINE SURGERY  08/2023   ACDF C5-6, C6-7, with plate and bone graft (Dr. Gust)   CHOLECYSTECTOMY  2016   COLONOSCOPY     multiple (+FH CC and personal hx of polyps). 04/27/20 no polyps, recall 5 yrs ([facility], Dr. Lisabeth)   HEMORRHOID SURGERY     JOINT REPLACEMENT  1993   bilateral hip replacement   left knee surgery  2010   NASAL SINUS SURGERY     NM MYOCAR PERF WALL MOTION  03/07/2012   EF 57%  low risk scan   NM MYOCAR PERF WALL MOTION  10/22/2006   EF 53%    NM MYOCAR PERF WALL MOTION  07/09/2003   MILD ANTERIOR wall thinning w/poss. superimposed anterolateral wall isch .-midventricular apex distrib. of mid to distal LAD   REVISION TOTAL HIP ARTHROPLASTY     right - revision- 03/2012   TOTAL HIP REVISION  03/27/2012   Procedure: TOTAL HIP REVISION;  Surgeon: Toribio JULIANNA Chancy, MD;  Location: [facility] OR;  Service: Orthopedics;  Laterality: Right;  with left knee cortisone injection   TOTAL HIP REVISION  05/15/2012   Procedure: TOTAL HIP REVISION;  Surgeon: Toribio JULIANNA Chancy,  MD;  Location: [facility] OR;  Service: Orthopedics;  Laterality: Left;   TOTAL KNEE ARTHROPLASTY Left 01/15/2013   Procedure: TOTAL KNEE ARTHROPLASTY- left ;  Surgeon: Toribio JULIANNA Chancy, MD;  Location: [facility] OR;  Service: Orthopedics;  Laterality: Left;   VENTRAL HERNIA REPAIR  2018   mesh insertion; Surgeron: F Vicenta Angst, MD; Location: [facility]; Service;General; Laterality; N/A;   WISDOM TOOTH EXTRACTION      MEDS:  Outpatient Medications Prior to Visit  Medication Sig Dispense Refill   albuterol  (VENTOLIN  HFA) 108 (90 Base) MCG/ACT inhaler Inhale 1-2 puffs into the lungs every 6 (six) hours as needed for wheezing or shortness of breath. 18 g 1   atorvastatin  (LIPITOR) 40 MG tablet Take 1 tablet (40 mg total) by mouth daily. 90 tablet  3   diclofenac (VOLTAREN) 75 MG EC tablet Take 75 mg by mouth 2 (two) times daily as needed.     fluticasone  (CUTIVATE ) 0.05 % cream Apply topically 2 (two) times daily. 60 g 1   gabapentin (NEURONTIN) 300 MG capsule Take 1 capsule by mouth as needed.     GLUCOSAMINE-CHONDROITIN PO Take by mouth at bedtime.     hydrocortisone  (ANUSOL -HC) 2.5 % rectal cream Place 1 Application rectally 2 (two) times daily. 30 g 1   Loratadine-Pseudoephedrine (CLARITIN-D 12 HOUR PO) Take by mouth as needed.     losartan  (COZAAR ) 50 MG tablet 1 tab po bid 180 tablet 3   No facility-administered medications prior to visit.    Physical Exam     12/19/2023    8:10 AM 12/07/2023    3:56 PM 12/07/2023    3:55 PM  Vitals with BMI  Height 5' 9    Weight 208 lbs 13 oz  209 lbs 13 oz  BMI 30.82  30.97  Systolic 136 131 857  Diastolic 72 81 83  Pulse 72  104   Gen: Alert, well appearing.  Patient is oriented to person, place, time, and situation. AFFECT: pleasant, lucid thought and speech. Perianal area with 6 distinct incisions approximately 1 inch each, approximately 2 to 5 mm in depth.  Pink healthy tissue visible.  No exudate.  No surrounding erythema.  No tenderness. All the packing is gone.  Pertinent labs/imaging Last CBC Lab Results  Component Value Date   WBC 8.9 11/16/2023   HGB 14.1 11/16/2023   HCT 44.1 11/16/2023   MCV 88.2 11/16/2023   MCH 28.2 11/16/2023   RDW 14.2 11/16/2023   PLT 379 11/16/2023   Last metabolic panel Lab Results  Component Value Date   GLUCOSE 116 (H) 11/16/2023   NA 138 11/16/2023   K 5.4 (H) 11/16/2023   CL 99 11/16/2023   CO2 27 11/16/2023   BUN 15 11/16/2023   CREATININE 0.74 11/16/2023   EGFR 96 11/16/2023   CALCIUM  10.3 11/16/2023   PROT 7.4 11/16/2023   ALBUMIN  4.1 04/29/2020   BILITOT 0.4 11/16/2023   ALKPHOS 77 07/01/2020   AST 13 11/16/2023   ALT 18 11/16/2023   ASSESSMENT/PLAN:  Perirectal abscess. Doing well status post incision and  drainage by general surgeon. Discussed wound healing by secondary intention.  Thorough cleaning and dressing discussed. I will prescribe 7 more days of Augmentin  today.  Medical decision making of moderate complexity was utilized today.  FOLLOW UP: If not improving over the next 7 to 10 days.  Signed:  Gerlene Hockey, MD           12/19/2023

## 2024-01-03 ENCOUNTER — Encounter: Payer: Self-pay | Admitting: Family Medicine

## 2024-01-07 ENCOUNTER — Encounter: Payer: Self-pay | Admitting: Family Medicine

## 2024-01-07 ENCOUNTER — Ambulatory Visit (INDEPENDENT_AMBULATORY_CARE_PROVIDER_SITE_OTHER): Admitting: Family Medicine

## 2024-01-07 VITALS — BP 145/87 | HR 87 | Temp 98.3°F | Ht 69.0 in | Wt 208.2 lb

## 2024-01-07 DIAGNOSIS — I10 Essential (primary) hypertension: Secondary | ICD-10-CM | POA: Diagnosis not present

## 2024-01-07 DIAGNOSIS — Z8719 Personal history of other diseases of the digestive system: Secondary | ICD-10-CM | POA: Diagnosis not present

## 2024-01-07 NOTE — Progress Notes (Signed)
 Office Note 01/07/2024  CC:  Chief Complaint  Patient presents with   Surgical Clearance    Surgery date 7/31; Dr. Hendrick [facility]); Left total shoulder arthroplasty       HPI:  Patient is a 73 y.o. male who is here for recheck of anal wound.  He had perirectal abscess incised and drained on 12/08/2023.  The wound was packed and has healed by secondary intention.  He has a very small and shallow fissure that is still yet to heal.  He wanted to get this checked out before his shoulder surgery because the surgeon recommended no open wounds on the day of surgery. He will be getting reverse total shoulder arthroplasty on the left. Surgery date: 01/10/24.  He has been doing sitz bath and he has no pain or bleeding.  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   Arthritis    heredity - hip dysplasia, OA   Dyshydrosis    Essential hypertension    onset 2020 per prior pcp record   Family history of colon cancer in mother    rpt colonoscopy due 2026   Family history of prostate cancer in father    Hay fever    History of blood transfusion    autologus   History of colon polyps    rpt colonoscopy due 2026   History of diverticulitis    severe diverticulosis in [place] and [place] on 2016 and 2021 colonoscopies   Hyperlipidemia    Mild intermittent asthma    rare use of ventolin  inhaler   Neck pain 11/26/2023   Obesity, Class I, BMI 30-34.9    Recurrent low back pain    +SI jt per pt (Murphy/Wainer)   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 EF58%,norm coronaries.   CATARACT EXTRACTION Left 08/26/2021   CATARACT EXTRACTION Right 08/12/2021   CERVICAL SPINE SURGERY  08/2023   ACDF C5-6, C6-7, with plate and bone graft (Dr. Gust)    CHOLECYSTECTOMY  2016   COLONOSCOPY     multiple (+FH CC and personal hx of polyps). 04/27/20 no polyps, recall 5 yrs ([facility], Dr. Lisabeth)   HEMORRHOID SURGERY     JOINT REPLACEMENT  1993   bilateral hip replacement   left knee surgery  2010   NASAL SINUS SURGERY     NM MYOCAR PERF WALL MOTION  03/07/2012   EF 57%  low risk scan   NM MYOCAR PERF WALL MOTION  10/22/2006   EF 53%    NM MYOCAR PERF WALL MOTION  07/09/2003   MILD ANTERIOR wall thinning w/poss. superimposed anterolateral wall isch .-midventricular apex distrib. of mid to distal LAD   REVISION TOTAL HIP ARTHROPLASTY     right - revision- 03/2012   TOTAL HIP REVISION  03/27/2012   Procedure: TOTAL HIP REVISION;  Surgeon: Toribio JULIANNA Chancy, MD;  Location: [facility] OR;  Service: Orthopedics;  Laterality: Right;  with left knee cortisone injection   TOTAL HIP REVISION  05/15/2012   Procedure: TOTAL HIP REVISION;  Surgeon: Toribio JULIANNA Chancy, MD;  Location: [facility] OR;  Service: Orthopedics;  Laterality: Left;   TOTAL KNEE ARTHROPLASTY Left 01/15/2013   Procedure: TOTAL KNEE ARTHROPLASTY- left ;  Surgeon: Toribio JULIANNA Chancy, MD;  Location: [facility] OR;  Service: Orthopedics;  Laterality: Left;   VENTRAL HERNIA REPAIR  2018   mesh insertion; Surgeron: F Vicenta Angst, MD; Location: [facility]; Service;General; Laterality; N/A;   WISDOM TOOTH EXTRACTION      Family History  Problem Relation Age of Onset   Colon cancer Mother 34   Hyperlipidemia Mother    Prostate cancer Father    Leukemia Sister 4   Stroke Maternal Grandfather    Throat cancer Paternal Grandfather    Diabetes Brother     Social History   Socioeconomic History   Marital status: Married    Spouse name: Not on file   Number of children: Not on file   Years of education: Not on file   Highest education level: Not on file  Occupational History   Not on file  Tobacco Use   Smoking status: Never   Smokeless tobacco: Never  Vaping Use   Vaping status: Never Used   Substance and Sexual Activity   Alcohol use: No    Alcohol/week: 2.0 standard drinks of alcohol    Types: 2 Cans of beer per week    Comment: occasional wine or beer   Drug use: No   Sexual activity: Yes    Partners: Female    Comment: married  Other Topics Concern   Not on file  Social History Narrative   Married, +daughters.   Orig from [place] , got PhD in [employer] at [place] [employer] [place] in [place] in 1981.   Occup: Development worker, community.   Educ: PhD   No T/A/D   Enjoys golf.   Social Drivers of Corporate investment banker Strain: Low Risk  (11/14/2023)   Received from [facility]   Overall Financial Resource Strain (CARDIA)    Difficulty of Paying Living Expenses: Not hard at all  Food Insecurity: No Food Insecurity (12/08/2023)   [facility]   Hunger Vital Sign    Within the past 12 months, you worried that your food would run out before you got the money to buy more.: Never true    Within the past 12 months, the food you bought just didn't last and you didn't have money to get more.: Never true  Transportation Needs: No Transportation Needs (12/08/2023)   Received from [facility] - Transportation    Lack of Transportation (Medical): No    Lack of Transportation (Non-Medical): No  Physical Activity: Insufficiently Active (03/21/2023)   Exercise Vital Sign    Days of Exercise per Week: 4 days    Minutes of Exercise per Session: 30 min  Stress: No Stress Concern Present (12/08/2023)   Received from [facility] - Occupational Stress Questionnaire    Feeling of Stress : Not at all  Social Connections: Socially Integrated (03/21/2023)   Social Connection and Isolation Panel    Frequency of Communication with Friends and Family: More than three times a week    Frequency of Social Gatherings with Friends and Family: More than three times a week    Attends Religious Services: More than  4 times per year    Active Member of [employer] or Organizations: Yes    Attends Banker Meetings: More than 4 times per year    Marital Status: Married  Catering manager Violence: Not At Risk (12/27/2023)   Received from [facility]   HITS    Over the last 12 months how often did your partner  physically hurt you?: Never    Over the last 12 months how often did your partner insult you or talk down to you?: Never    Over the last 12 months how often did your partner threaten you with physical harm?: Never    Over the last 12 months how often did your partner scream or curse at you?: Never    Outpatient Medications Prior to Visit  Medication Sig Dispense Refill   albuterol  (VENTOLIN  HFA) 108 (90 Base) MCG/ACT inhaler Inhale 1-2 puffs into the lungs every 6 (six) hours as needed for wheezing or shortness of breath. 18 g 1   atorvastatin  (LIPITOR) 40 MG tablet Take 1 tablet (40 mg total) by mouth daily. 90 tablet 3   diclofenac (VOLTAREN) 75 MG EC tablet Take 75 mg by mouth 2 (two) times daily as needed.     fluticasone  (CUTIVATE ) 0.05 % cream Apply topically 2 (two) times daily. 60 g 1   gabapentin (NEURONTIN) 300 MG capsule Take 1 capsule by mouth as needed.     GLUCOSAMINE-CHONDROITIN PO Take by mouth at bedtime.     hydrocortisone  (ANUSOL -HC) 2.5 % rectal cream Place 1 Application rectally 2 (two) times daily. 30 g 1   Loratadine-Pseudoephedrine (CLARITIN-D 12 HOUR PO) Take by mouth as needed.     losartan  (COZAAR ) 50 MG tablet 1 tab po bid 180 tablet 3   aspirin  EC 81 MG tablet Take one tablet (81 mg dose) by mouth 2 (two) times daily for 10 days. To be obtained over-the-counter and started after surgery for 2 weeks twice daily. (Patient not taking: Reported on 01/07/2024)     methocarbamol  (ROBAXIN ) 500 MG tablet Take 500 mg by mouth. Take one tablet (500 mg dose) by mouth 4 (four) times daily for 10 days. Medication is to be used after surgery (Patient not taking: Reported on  01/07/2024)     ondansetron  (ZOFRAN ) 4 MG tablet Take one tablet (4 mg dose) by mouth every 8 (eight) hours as needed for Nausea for up to 7 days. Medication is to be used after surgery (Patient not taking: Reported on 01/07/2024)     oxyCODONE  (OXY IR/ROXICODONE ) 5 MG immediate release tablet Take one tablet (5 mg dose) by mouth every 4 (four) hours as needed for Pain for up to 5 days. Medication is to be used after surgery Max Daily Amount: 30 mg (Patient not taking: Reported on 01/07/2024)     No facility-administered medications prior to visit.    Allergies  Allergen Reactions   Sulfa Antibiotics     Childhood allergy   Sulfasalazine Other (See Comments)    unknown   Lisinopril  Cough    PE;    01/07/2024    2:51 PM 01/07/2024    2:44 PM 12/19/2023    8:10 AM  Vitals with BMI  Height  5' 9 5' 9  Weight  208 lbs 3 oz 208 lbs 13 oz  BMI  30.73 30.82  Systolic 145 149 863  Diastolic 87 89 72  Pulse  87 72     Gen: Alert, well appearing.  Patient is oriented to person, place, time, and situation. AFFECT: pleasant, lucid thought and speech. Anal exam: He has a shallow slit about 2 cm long in the perianal area.  All but the surface epithelium has closed.  No exudate or erythema or tenderness.  Pertinent labs:  Lab Results  Component Value Date   TSH 1.97 11/16/2023   Lab Results  Component Value Date  WBC 8.9 11/16/2023   HGB 14.1 11/16/2023   HCT 44.1 11/16/2023   MCV 88.2 11/16/2023   PLT 379 11/16/2023   Lab Results  Component Value Date   CREATININE 0.74 11/16/2023   BUN 15 11/16/2023   NA 138 11/16/2023   K 5.4 (H) 11/16/2023   CL 99 11/16/2023   CO2 27 11/16/2023   Lab Results  Component Value Date   ALT 18 11/16/2023   AST 13 11/16/2023   ALKPHOS 77 07/01/2020   BILITOT 0.4 11/16/2023   Lab Results  Component Value Date   CHOL 170 11/16/2023   Lab Results  Component Value Date   HDL 50 11/16/2023   Lab Results  Component Value Date   LDLCALC  100 (H) 11/16/2023   Lab Results  Component Value Date   TRIG 107 11/16/2023   Lab Results  Component Value Date   CHOLHDL 3.4 11/16/2023   Lab Results  Component Value Date   PSA 1.69 05/18/2023   PSA 1.88 04/28/2022   PSA 2.00 04/27/2021   Lab Results  Component Value Date   HGBA1C 6.0 (H) 11/16/2023   ASSESSMENT AND PLAN:   #1 perirectal abscess, status post I&D on 12/08/2023, wound has nearly completely healed by secondary intention. I do not think this should prevent his upcoming shoulder surgery.  #2 hypertension, poor control lately.  Currently taking losartan  50 mg twice a day. He admits to eating lots of sodium. He will work on significantly restricting sodium and if blood pressure is not consistently coming down less than 130/80 then we will add amlodipine.  An After Visit Summary was printed and given to the patient.  FOLLOW UP:  Return for as needed.  Signed:  Gerlene Hockey, MD           01/07/2024

## 2024-02-20 ENCOUNTER — Other Ambulatory Visit: Payer: Self-pay | Admitting: Family Medicine

## 2024-03-06 ENCOUNTER — Other Ambulatory Visit: Payer: Self-pay | Admitting: Family Medicine

## 2024-03-26 ENCOUNTER — Ambulatory Visit

## 2024-04-30 ENCOUNTER — Ambulatory Visit (INDEPENDENT_AMBULATORY_CARE_PROVIDER_SITE_OTHER)

## 2024-04-30 VITALS — Ht 69.0 in | Wt 208.0 lb

## 2024-04-30 DIAGNOSIS — Z0000 Encounter for general adult medical examination without abnormal findings: Secondary | ICD-10-CM

## 2024-04-30 NOTE — Patient Instructions (Signed)
 Mr. Parkey,  Thank you for taking the time for your Medicare Wellness Visit. I appreciate your continued commitment to your health goals. Please review the care plan we discussed, and feel free to reach out if I can assist you further.  Please note that Annual Wellness Visits do not include a physical exam. Some assessments may be limited, especially if the visit was conducted virtually. If needed, we may recommend an in-person follow-up with your provider.  Ongoing Care Seeing your primary care provider every 3 to 6 months helps us  monitor your health and provide consistent, personalized care. Next office visit on 05/19/2024.  You are due for a Flu vaccine and can get that done during your next office visit.  You are also due for a Shingles vaccine and that can be given at your local pharmacy.  Keep up the good work.  Referrals If a referral was made during today's visit and you haven't received any updates within two weeks, please contact the referred provider directly to check on the status.  Recommended Screenings:  Health Maintenance  Topic Date Due   Zoster (Shingles) Vaccine (1 of 2) 06/24/1969   COVID-19 Vaccine (2 - Janssen risk series) 10/15/2019   Flu Shot  01/11/2024   Colon Cancer Screening  04/27/2025   Medicare Annual Wellness Visit  04/30/2025   DTaP/Tdap/Td vaccine (3 - Td or Tdap) 12/01/2029   Pneumococcal Vaccine for age over 84  Completed   Hepatitis C Screening  Completed   Meningitis B Vaccine  Aged Out       04/30/2024    3:56 PM  Advanced Directives  Does Patient Have a Medical Advance Directive? Yes  Type of Estate Agent of [place];Living will    Vision: Annual vision screenings are recommended for early detection of glaucoma, cataracts, and diabetic retinopathy. These exams can also reveal signs of chronic conditions such as diabetes and high blood pressure.  Dental: Annual dental screenings help detect early signs of oral cancer,  gum disease, and other conditions linked to overall health, including heart disease and diabetes.  Please see the attached documents for additional preventive care recommendations.

## 2024-04-30 NOTE — Progress Notes (Signed)
 Chief Complaint  Patient presents with   Medicare Wellness     Subjective:   Robert [name]. is a 73 y.o. male who presents for a Medicare Annual Wellness Visit.  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   Arthritis    heredity - hip dysplasia, OA   Dyshydrosis    Essential hypertension    onset 2020 per prior pcp record   Family history of colon cancer in mother    rpt colonoscopy due 2026   Family history of prostate cancer in father    Hay fever    History of blood transfusion    autologus   History of colon polyps    rpt colonoscopy due 2026   History of diverticulitis    severe diverticulosis in [place] and [place] on 2016 and 2021 colonoscopies   Hyperlipidemia    Mild intermittent asthma    rare use of ventolin  inhaler   Neck pain 11/26/2023   Obesity, Class I, BMI 30-34.9    Recurrent low back pain    +SI jt per pt (Murphy/Wainer)   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 EF58%,norm coronaries.   CATARACT EXTRACTION Left 08/26/2021   CATARACT EXTRACTION Right 08/12/2021   CERVICAL SPINE SURGERY  08/2023   ACDF C5-6, C6-7, with plate and bone graft (Dr. Gust)   CHOLECYSTECTOMY  2016   COLONOSCOPY     multiple (+FH CC and personal hx of polyps). 04/27/20 no polyps, recall 5 yrs ([facility], Dr. Lisabeth)   HEMORRHOID SURGERY     JOINT REPLACEMENT  1993   bilateral hip replacement   left knee surgery  2010   NASAL SINUS SURGERY     NM MYOCAR PERF WALL MOTION  03/07/2012   EF 57%  low risk scan   NM MYOCAR PERF WALL MOTION  10/22/2006   EF 53%    NM MYOCAR PERF WALL MOTION  07/09/2003   MILD ANTERIOR wall thinning w/poss. superimposed anterolateral wall isch  .-midventricular apex distrib. of mid to distal LAD   REVISION TOTAL HIP ARTHROPLASTY     right - revision- 03/2012   TOTAL HIP REVISION  03/27/2012   Procedure: TOTAL HIP REVISION;  Surgeon: Toribio JULIANNA Chancy, MD;  Location: [facility] OR;  Service: Orthopedics;  Laterality: Right;  with left knee cortisone injection   TOTAL HIP REVISION  05/15/2012   Procedure: TOTAL HIP REVISION;  Surgeon: Toribio JULIANNA Chancy, MD;  Location: [facility] OR;  Service: Orthopedics;  Laterality: Left;   TOTAL KNEE ARTHROPLASTY Left 01/15/2013   Procedure: TOTAL KNEE ARTHROPLASTY- left ;  Surgeon: Toribio JULIANNA Chancy, MD;  Location: [facility] OR;  Service: Orthopedics;  Laterality: Left;   TOTAL SHOULDER ARTHROPLASTY Left 12/2023   VENTRAL HERNIA REPAIR  2018   mesh insertion; Surgeron: JULIANNA Vicenta Angst, MD; Location: [facility]; Service;General; Laterality; N/A;   WISDOM TOOTH EXTRACTION     Family History  Problem Relation Age of Onset   Colon cancer Mother 56   Hyperlipidemia Mother    Prostate cancer Father    Leukemia Sister 4   Stroke Maternal Grandfather    Throat cancer Paternal Grandfather    Diabetes Brother    Social History  Occupational History   Not on file  Tobacco Use   Smoking status: Never   Smokeless tobacco: Never  Vaping Use   Vaping status: Never Used  Substance and Sexual Activity   Alcohol use: No    Alcohol/week: 2.0 standard drinks of alcohol    Types: 2 Cans of beer per week    Comment: occasional wine or beer   Drug use: No   Sexual activity: Yes    Partners: Female    Comment: married   Tobacco Counseling Counseling given: Not Answered  SDOH Screenings   Food Insecurity: No Food Insecurity (01/10/2024)   Received from [facility]  Housing: Low Risk  (01/10/2024)   Received from [facility]  Transportation Needs: No Transportation Needs (01/10/2024)   Received from [facility]  Utilities: Not At Risk (01/10/2024)   Received from [facility]  Alcohol Screen: Low Risk   (03/21/2023)  Depression (PHQ2-9): Low Risk  (11/16/2023)  Financial Resource Strain: Low Risk  (11/14/2023)   Received from [facility]  Physical Activity: Insufficiently Active (03/21/2023)  Social Connections: Socially Integrated (03/21/2023)  Stress: No Stress Concern Present (01/10/2024)   Received from [facility]  Tobacco Use: Low Risk  (04/30/2024)  Health Literacy: Adequate Health Literacy (03/21/2023)   See flowsheets for full screening details  Depression Screen PHQ 2 & 9 Depression Scale- Over the past 2 weeks, how often have you been bothered by any of the following problems? Little interest or pleasure in doing things: 0 Feeling down, depressed, or hopeless (PHQ Adolescent also includes...irritable): 0 PHQ-2 Total Score: 0     Goals Addressed   None    Visit info / Clinical Intake: Medicare Wellness Visit Type:: Subsequent Annual Wellness Visit Persons participating in visit:: patient Medicare Wellness Visit Mode:: Telephone If telephone:: video declined Because this visit was a virtual/telehealth visit:: vitals recorded from last visit If Telephone or Video please confirm:: I connected with the patient using audio enabled telemedicine application and verified that I am speaking with the correct person using two identifiers; I discussed the limitations of evaluation and management by telemedicine; The patient expressed understanding and agreed to proceed Patient Location:: Driving Provider Location:: Home Information given by:: patient Interpreter Needed?: No Pre-visit prep was completed: no AWV questionnaire completed by patient prior to visit?: no Living arrangements:: lives with spouse/significant other Patient's Overall Health Status Rating: good Typical amount of pain: none Does pain affect daily life?: no Are you currently prescribed opioids?: no  Dietary Habits and Nutritional Risks How many meals a day?: 2 Eats fruit and vegetables daily?: yes Most meals  are obtained by: preparing own meals; eating out In the last 2 weeks, have you had any of the following?: none Diabetic:: no  Functional Status Manage your own finances?: yes Primary transportation is: driving  Fall Screening Falls in the past year?: 0 Number of falls in past year: 0 Was there an injury with Fall?: 0 Fall Risk Category Calculator: 0 Patient Fall Risk Level: Low Fall Risk  Fall Risk Patient at Risk for Falls Due to: No Fall Risks Fall risk Follow up: Falls evaluation completed        Objective:    Today's Vitals   04/30/24 1528  Weight: 208 lb (94.3 kg)  Height: 5' 9 (1.753 m)   Body mass index is 30.72 kg/m.  Current Medications (verified) Outpatient Encounter Medications as of 04/30/2024  Medication Sig   albuterol  (VENTOLIN  HFA) 108 (90 Base) MCG/ACT inhaler Inhale 1-2 puffs  into the lungs every 6 (six) hours as needed for wheezing or shortness of breath.   aspirin  EC 81 MG tablet Take 81 mg by mouth.   atorvastatin  (LIPITOR) 40 MG tablet TAKE 1 TABLET BY MOUTH EVERY DAY   diclofenac (VOLTAREN) 75 MG EC tablet Take 75 mg by mouth 2 (two) times daily as needed.   fluticasone  (CUTIVATE ) 0.05 % cream APPLY TOPICALLY TWICE A DAY   GLUCOSAMINE-CHONDROITIN PO Take by mouth at bedtime.   hydrocortisone  (ANUSOL -HC) 2.5 % rectal cream Place 1 Application rectally 2 (two) times daily.   Loratadine-Pseudoephedrine (CLARITIN-D 12 HOUR PO) Take by mouth as needed.   losartan  (COZAAR ) 50 MG tablet 1 tab po bid   gabapentin (NEURONTIN) 300 MG capsule Take 1 capsule by mouth as needed. (Patient not taking: Reported on 04/30/2024)   No facility-administered encounter medications on file as of 04/30/2024.   Hearing/Vision screen No results found. Immunizations and Health Maintenance Health Maintenance  Topic Date Due   Zoster Vaccines- Shingrix (1 of 2) 06/24/1969   COVID-19 Vaccine (2 - Janssen risk series) 10/15/2019   Influenza Vaccine  01/11/2024    Medicare Annual Wellness (AWV)  03/20/2024   Colonoscopy  04/27/2025   DTaP/Tdap/Td (3 - Td or Tdap) 12/01/2029   Pneumococcal Vaccine: 50+ Years  Completed   Hepatitis C Screening  Completed   Meningococcal B Vaccine  Aged Out        Assessment/Plan:  This is a routine wellness examination for Aleck.  Patient Care Team: Candise Aleene DEL, MD as PCP - General (Family Medicine) Cleotilde Sewer, OD (Optometry) Blackard, Elsie MATSU, MD as Consulting Physician (Gastroenterology) Burnard Debby LABOR, MD (Inactive) as Consulting Physician (Cardiology) Sheril Coy, MD as Consulting Physician (Orthopedic Surgery)  I have personally reviewed and noted the following in the patient's chart:   Medical and social history Use of alcohol, tobacco or illicit drugs  Current medications and supplements including opioid prescriptions. Functional ability and status Nutritional status Physical activity Advanced directives List of other physicians Hospitalizations, surgeries, and ER visits in previous 12 months Vitals Screenings to include cognitive, depression, and falls Referrals and appointments  No orders of the defined types were placed in this encounter.  In addition, I have reviewed and discussed with patient certain preventive protocols, quality metrics, and best practice recommendations. A written personalized care plan for preventive services as well as general preventive health recommendations were provided to patient.   Lus Kriegel L Wendee Hata, CMA   04/30/2024   No follow-ups on file.  After Visit Summary: (MyChart) Due to this being a telephonic visit, the after visit summary with patients personalized plan was offered to patient via MyChart   Nurse Notes: Patient is due for a flu vaccine and he would like to get that done at your local pharmacy.  Patient is also due for a Shingrix vaccine.  He had no other concerns to address today.

## 2024-05-05 ENCOUNTER — Other Ambulatory Visit: Payer: Self-pay | Admitting: Family Medicine

## 2024-05-05 NOTE — Telephone Encounter (Signed)
 Copied from CRM [id number]. Topic: Clinical - Medication Refill >> May 05, 2024  9:58 AM Aleatha C wrote: Medication:  diclofenac (VOLTAREN) 75 MG EC tablet Has the patient contacted their pharmacy? No (Agent: If no, request that the patient contact the pharmacy for the refill. If patient does not wish to contact the pharmacy document the reason why and proceed with request.) (Agent: If yes, when and what did the pharmacy advise?)  This is the patient's preferred pharmacy:  [id number] - [place], [place] - [address] [place] [address] [place] [address] [place]  [address] Phone: [phone] Fax: [phone]  Is this the correct pharmacy for this prescription? Yes If no, delete pharmacy and type the correct one.   Has the prescription been filled recently? No  Is the patient out of the medication? Yes  Has the patient been seen for an appointment in the last year OR does the patient have an upcoming appointment? Yes  Can we respond through MyChart? No  Agent: Please be advised that Rx refills may take up to 3 business days. We ask that you follow-up with your pharmacy.

## 2024-05-15 ENCOUNTER — Other Ambulatory Visit: Payer: Self-pay | Admitting: Family Medicine

## 2024-05-19 ENCOUNTER — Encounter: Admitting: Family Medicine

## 2024-05-19 NOTE — Progress Notes (Deleted)
 Office Note 05/19/2024  CC: No chief complaint on file.   HPI:  Patient is a 73 y.o. male who is here for annual health maintenance exam and follow-up of hypertension, hypercholesterolemia, and prediabetes.  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   Arthritis    heredity - hip dysplasia, OA   Dyshydrosis    Essential hypertension    onset 2020 per prior pcp record   Family history of colon cancer in mother    rpt colonoscopy due 2026   Family history of prostate cancer in father    Hay fever    History of blood transfusion    autologus   History of colon polyps    rpt colonoscopy due 2026   History of diverticulitis    severe diverticulosis in [place] and [place] on 2016 and 2021 colonoscopies   Hyperlipidemia    Mild intermittent asthma    rare use of ventolin  inhaler   Neck pain 11/26/2023   Obesity, Class I, BMI 30-34.9    Recurrent low back pain    +SI jt per pt (Murphy/Wainer)   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 EF58%,norm coronaries.   CATARACT EXTRACTION Left 08/26/2021   CATARACT EXTRACTION Right 08/12/2021   CERVICAL SPINE SURGERY  08/2023   ACDF C5-6, C6-7, with plate and bone graft (Dr. Gust)   CHOLECYSTECTOMY  2016   COLONOSCOPY     multiple (+FH CC and personal hx of polyps). 04/27/20 no polyps, recall 5 yrs ([facility], Dr. Lisabeth)   HEMORRHOID SURGERY     JOINT REPLACEMENT  1993   bilateral hip replacement   left knee surgery  2010   NASAL SINUS SURGERY     NM MYOCAR PERF WALL MOTION  03/07/2012   EF 57%  low risk scan   NM MYOCAR PERF WALL MOTION  10/22/2006   EF 53%    NM MYOCAR PERF WALL MOTION  07/09/2003   MILD ANTERIOR wall thinning w/poss. superimposed anterolateral wall isch .-midventricular apex distrib. of mid to distal LAD    REVISION TOTAL HIP ARTHROPLASTY     right - revision- 03/2012   TOTAL HIP REVISION  03/27/2012   Procedure: TOTAL HIP REVISION;  Surgeon: Toribio JULIANNA Chancy, MD;  Location: [facility] OR;  Service: Orthopedics;  Laterality: Right;  with left knee cortisone injection   TOTAL HIP REVISION  05/15/2012   Procedure: TOTAL HIP REVISION;  Surgeon: Toribio JULIANNA Chancy, MD;  Location: [facility] OR;  Service: Orthopedics;  Laterality: Left;   TOTAL KNEE ARTHROPLASTY Left 01/15/2013   Procedure: TOTAL KNEE ARTHROPLASTY- left ;  Surgeon: Toribio JULIANNA Chancy, MD;  Location: [facility] OR;  Service: Orthopedics;  Laterality: Left;   TOTAL SHOULDER ARTHROPLASTY Left 12/2023   VENTRAL HERNIA REPAIR  2018   mesh insertion; Surgeron: JULIANNA Vicenta Angst, MD; Location: [facility]; Service;General; Laterality; N/A;   WISDOM TOOTH EXTRACTION      Family History  Problem Relation Age of Onset   Colon cancer Mother 32   Hyperlipidemia Mother    Prostate cancer Father    Leukemia Sister 4   Stroke Maternal Grandfather    Throat cancer Paternal Grandfather    Diabetes Brother     Social History   Socioeconomic History   Marital status: Married  Spouse name: Lenward   Number of children: 2   Years of education: Not on file   Highest education level: Not on file  Occupational History   Not on file  Tobacco Use   Smoking status: Never   Smokeless tobacco: Never  Vaping Use   Vaping status: Never Used  Substance and Sexual Activity   Alcohol use: No    Alcohol/week: 2.0 standard drinks of alcohol    Types: 2 Cans of beer per week    Comment: occasional wine or beer   Drug use: No   Sexual activity: Yes    Partners: Female    Comment: married  Other Topics Concern   Not on file  Social History Narrative   Married, +daughters.   Orig from [place] , got PhD in [employer] at [employer] in [place] in 1981.   Occup: development worker, community.   Educ: PhD   No T/A/D   Enjoys golf.   Lives with  wife/2025   Social Drivers of Health   Financial Resource Strain: Low Risk (11/14/2023)   [facility]   Overall Financial Resource Strain (CARDIA)    Difficulty of Paying Living Expenses: Not hard at all  Food Insecurity: No Food Insecurity (04/30/2024)   Hunger Vital Sign    Worried About Running Out of Food in the Last Year: Never true    Ran Out of Food in the Last Year: Never true  Transportation Needs: No Transportation Needs (04/30/2024)   PRAPARE - Administrator, Civil Service (Medical): No    Lack of Transportation (Non-Medical): No  Physical Activity: Sufficiently Active (04/30/2024)   Exercise Vital Sign    Days of Exercise per Week: 3 days    Minutes of Exercise per Session: 60 min  Stress: No Stress Concern Present (04/30/2024)   [employer] of Occupational Health - Occupational Stress Questionnaire    Feeling of Stress: Not at all  Social Connections: Socially Integrated (04/30/2024)   Social Connection and Isolation Panel    Frequency of Communication with Friends and Family: More than three times a week    Frequency of Social Gatherings with Friends and Family: Three times a week    Attends Religious Services: More than 4 times per year    Active Member of Clubs or Organizations: Yes    Attends Banker Meetings: More than 4 times per year    Marital Status: Married  Catering Manager Violence: Not At Risk (04/30/2024)   Humiliation, Afraid, Rape, and Kick questionnaire    Fear of Current or Ex-Partner: No    Emotionally Abused: No    Physically Abused: No    Sexually Abused: No    Outpatient Medications Prior to Visit  Medication Sig Dispense Refill   albuterol  (VENTOLIN  HFA) 108 (90 Base) MCG/ACT inhaler Inhale 1-2 puffs into the lungs every 6 (six) hours as needed for wheezing or shortness of breath. 18 g 1   aspirin  EC 81 MG tablet Take 81 mg by mouth.     atorvastatin  (LIPITOR) 40 MG tablet TAKE 1 TABLET BY  MOUTH EVERY DAY 90 tablet 1   diclofenac (VOLTAREN) 75 MG EC tablet Take 75 mg by mouth 2 (two) times daily as needed.     fluticasone  (CUTIVATE ) 0.05 % cream APPLY TOPICALLY TWICE A DAY 60 g 0   gabapentin (NEURONTIN) 300 MG capsule Take 1 capsule by mouth as needed. (Patient not taking: Reported on 04/30/2024)  GLUCOSAMINE-CHONDROITIN PO Take by mouth at bedtime.     hydrocortisone  (ANUSOL -HC) 2.5 % rectal cream Place 1 Application rectally 2 (two) times daily. 30 g 1   Loratadine-Pseudoephedrine (CLARITIN-D 12 HOUR PO) Take by mouth as needed.     losartan  (COZAAR ) 50 MG tablet TAKE 1 TABLET BY MOUTH TWICE A DAY 30 tablet 0   No facility-administered medications prior to visit.    Allergies  Allergen Reactions   Sulfa Antibiotics     Childhood allergy   Sulfasalazine Other (See Comments)    unknown   Lisinopril  Cough    Review of Systems *** PE;    04/30/2024    3:28 PM 01/07/2024    2:51 PM 01/07/2024    2:44 PM  Vitals with BMI  Height 5' 9  5' 9  Weight 208 lbs  208 lbs 3 oz  BMI 30.7  30.73  Systolic  145 149  Diastolic  87 89  Pulse   87     *** Pertinent labs:  Lab Results  Component Value Date   TSH 1.97 11/16/2023   Lab Results  Component Value Date   WBC 8.9 11/16/2023   HGB 14.1 11/16/2023   HCT 44.1 11/16/2023   MCV 88.2 11/16/2023   PLT 379 11/16/2023   Lab Results  Component Value Date   CREATININE 0.74 11/16/2023   BUN 15 11/16/2023   NA 138 11/16/2023   K 5.4 (H) 11/16/2023   CL 99 11/16/2023   CO2 27 11/16/2023   Lab Results  Component Value Date   ALT 18 11/16/2023   AST 13 11/16/2023   ALKPHOS 77 07/01/2020   BILITOT 0.4 11/16/2023   Lab Results  Component Value Date   CHOL 170 11/16/2023   Lab Results  Component Value Date   HDL 50 11/16/2023   Lab Results  Component Value Date   LDLCALC 100 (H) 11/16/2023   Lab Results  Component Value Date   TRIG 107 11/16/2023   Lab Results  Component Value Date    CHOLHDL 3.4 11/16/2023   Lab Results  Component Value Date   PSA 1.69 05/18/2023   PSA 1.88 04/28/2022   PSA 2.00 04/27/2021   Lab Results  Component Value Date   HGBA1C 6.0 (H) 11/16/2023   ASSESSMENT AND PLAN:   No problem-specific Assessment & Plan notes found for this encounter.  1 health maintenance exam: Reviewed age and gender appropriate health maintenance issues (prudent diet, regular exercise, health risks of tobacco and excessive alcohol, use of seatbelts, fire alarms in home, use of sunscreen).  Also reviewed age and gender appropriate health screening as well as vaccine recommendations. Vaccines:  Shingrix-->***.  Flu-->***. Labs: cbc, cmet, flp, PSA, hba1c. Prostate ca screening: FH prostate cancer->PSA. Colon ca screening: FH colon cancer-->recall 04/2025  An After Visit Summary was printed and given to the patient.  FOLLOW UP:  No follow-ups on file.  Signed:  Gerlene Hockey, MD           05/19/2024

## 2024-05-29 ENCOUNTER — Other Ambulatory Visit: Payer: Self-pay | Admitting: Family Medicine

## 2024-06-05 NOTE — Progress Notes (Unsigned)
 "     Office Note 06/06/2024  CC:  Chief Complaint  Patient presents with   Annual Exam    HPI:  Patient is a 73 y.o. male who is here for annual health maintenance exam and f/u HTN, HLD, and prediabetes.  He is feeling well. He is getting right shoulder arthroscopic surgery next month.  He takes diclofenac approximately 2 days a week and gets good relief for right shoulder pain. His knees are beginning to give him a little trouble and he will be seeing his knee orthopedist tomorrow.  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   Arthritis    heredity - hip dysplasia, OA   Dyshydrosis    Essential hypertension    onset 2020 per prior pcp record   Family history of colon cancer in mother    rpt colonoscopy due 2026   Family history of prostate cancer in father    Hay fever    History of blood transfusion    autologus   History of colon polyps    rpt colonoscopy due 2026   History of diverticulitis    severe diverticulosis in [place] and [place] on 2016 and 2021 colonoscopies   Hyperlipidemia    Mild intermittent asthma    rare use of ventolin  inhaler   Neck pain 11/26/2023   Obesity, Class I, BMI 30-34.9    Recurrent low back pain    +SI jt per pt (Murphy/Wainer)   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 EF58%,norm coronaries.   CATARACT EXTRACTION Left 08/26/2021   CATARACT EXTRACTION Right 08/12/2021   CERVICAL SPINE SURGERY  08/2023   ACDF C5-6, C6-7, with plate and bone graft (Dr. Gust)   CHOLECYSTECTOMY  2016   COLONOSCOPY     multiple (+FH CC and personal hx of polyps). 04/27/20 no polyps, recall 5 yrs ([facility], Dr. Lisabeth)   HEMORRHOID SURGERY     JOINT REPLACEMENT  1993   bilateral hip replacement   left knee surgery  2010   NASAL SINUS SURGERY      NM MYOCAR PERF WALL MOTION  03/07/2012   EF 57%  low risk scan   NM MYOCAR PERF WALL MOTION  10/22/2006   EF 53%    NM MYOCAR PERF WALL MOTION  07/09/2003   MILD ANTERIOR wall thinning w/poss. superimposed anterolateral wall isch .-midventricular apex distrib. of mid to distal LAD   REVISION TOTAL HIP ARTHROPLASTY     right - revision- 03/2012   TOTAL HIP REVISION  03/27/2012   Procedure: TOTAL HIP REVISION;  Surgeon: Toribio JULIANNA Chancy, MD;  Location: [facility] OR;  Service: Orthopedics;  Laterality: Right;  with left knee cortisone injection   TOTAL HIP REVISION  05/15/2012   Procedure: TOTAL HIP REVISION;  Surgeon: Toribio JULIANNA Chancy, MD;  Location: [facility] OR;  Service: Orthopedics;  Laterality: Left;   TOTAL KNEE ARTHROPLASTY Left 01/15/2013   Procedure: TOTAL KNEE ARTHROPLASTY- left ;  Surgeon: Toribio JULIANNA Chancy, MD;  Location: [facility] OR;  Service: Orthopedics;  Laterality: Left;   TOTAL SHOULDER ARTHROPLASTY Left 12/2023   VENTRAL HERNIA REPAIR  2018   mesh insertion; Surgeron: JULIANNA Vicenta Angst, MD; Location: [facility] OR; Service;General; Laterality; N/A;   WISDOM TOOTH EXTRACTION      Family  History  Problem Relation Age of Onset   Colon cancer Mother 11   Hyperlipidemia Mother    Prostate cancer Father    Leukemia Sister 4   Stroke Maternal Grandfather    Throat cancer Paternal Grandfather    Diabetes Brother     Social History   Socioeconomic History   Marital status: Married    Spouse name: Lenward   Number of children: 2   Years of education: Not on file   Highest education level: Not on file  Occupational History   Not on file  Tobacco Use   Smoking status: Never   Smokeless tobacco: Never  Vaping Use   Vaping status: Never Used  Substance and Sexual Activity   Alcohol use: No    Alcohol/week: 2.0 standard drinks of alcohol    Types: 2 Cans of beer per week    Comment: occasional wine or beer   Drug use: No   Sexual activity: Yes    Partners: Female    Comment:  married  Other Topics Concern   Not on file  Social History Narrative   Married, +daughters.   Orig from [place] , got PhD in [employer] at [employer] in [place] in 1981.   Occup: development worker, community.   Educ: PhD   No T/A/D   Enjoys golf.   Lives with wife/2025   Social Drivers of Health   Tobacco Use: Low Risk (06/06/2024)   Patient History    Smoking Tobacco Use: Never    Smokeless Tobacco Use: Never    Passive Exposure: Not on file  Financial Resource Strain: Low Risk (11/14/2023)   Received from [facility]   Overall Financial Resource Strain (CARDIA)    Difficulty of Paying Living Expenses: Not hard at all  Food Insecurity: No Food Insecurity (04/30/2024)   Epic    Worried About Programme Researcher, Broadcasting/film/video in the Last Year: Never true    Ran Out of Food in the Last Year: Never true  Transportation Needs: No Transportation Needs (04/30/2024)   Epic    Lack of Transportation (Medical): No    Lack of Transportation (Non-Medical): No  Physical Activity: Sufficiently Active (04/30/2024)   Exercise Vital Sign    Days of Exercise per Week: 3 days    Minutes of Exercise per Session: 60 min  Stress: No Stress Concern Present (04/30/2024)   [employer] of Occupational Health - Occupational Stress Questionnaire    Feeling of Stress: Not at all  Social Connections: Socially Integrated (04/30/2024)   Social Connection and Isolation Panel    Frequency of Communication with Friends and Family: More than three times a week    Frequency of Social Gatherings with Friends and Family: Three times a week    Attends Religious Services: More than 4 times per year    Active Member of Clubs or Organizations: Yes    Attends Banker Meetings: More than 4 times per year    Marital Status: Married  Catering Manager Violence: Not At Risk (04/30/2024)   Epic    Fear of Current or Ex-Partner: No    Emotionally Abused: No    Physically Abused: No     Sexually Abused: No  Depression (PHQ2-9): Low Risk (04/30/2024)   Depression (PHQ2-9)    PHQ-2 Score: 0  Alcohol Screen: Low Risk (03/21/2023)   Alcohol Screen    Last Alcohol Screening Score (AUDIT): 1  Housing: Unknown (04/30/2024)   Epic    Unable to  Pay for Housing in the Last Year: No    Number of Times Moved in the Last Year: Not on file    Homeless in the Last Year: No  Utilities: Not At Risk (04/30/2024)   Epic    Threatened with loss of utilities: No  Health Literacy: Adequate Health Literacy (04/30/2024)   B1300 Health Literacy    Frequency of need for help with medical instructions: Never    Outpatient Medications Prior to Visit  Medication Sig Dispense Refill   albuterol  (VENTOLIN  HFA) 108 (90 Base) MCG/ACT inhaler Inhale 1-2 puffs into the lungs every 6 (six) hours as needed for wheezing or shortness of breath. 18 g 1   aspirin  EC 81 MG tablet Take 81 mg by mouth.     diclofenac (VOLTAREN) 75 MG EC tablet Take 75 mg by mouth 2 (two) times daily as needed.     GLUCOSAMINE-CHONDROITIN PO Take by mouth at bedtime.     Loratadine-Pseudoephedrine (CLARITIN-D 12 HOUR PO) Take by mouth as needed.     MELATONIN PO Take by mouth as needed.     atorvastatin  (LIPITOR) 40 MG tablet TAKE 1 TABLET BY MOUTH EVERY DAY 90 tablet 1   losartan  (COZAAR ) 50 MG tablet TAKE 1 TABLET BY MOUTH TWICE A DAY 30 tablet 0   hydrocortisone  (ANUSOL -HC) 2.5 % rectal cream Place 1 Application rectally 2 (two) times daily. (Patient not taking: Reported on 06/06/2024) 30 g 1   fluticasone  (CUTIVATE ) 0.05 % cream APPLY TOPICALLY TWICE A DAY (Patient not taking: Reported on 06/06/2024) 60 g 0   gabapentin (NEURONTIN) 300 MG capsule Take 1 capsule by mouth as needed. (Patient not taking: Reported on 06/06/2024)     No facility-administered medications prior to visit.    Allergies[1]  Review of Systems  Constitutional:  Negative for appetite change, chills, fatigue and fever.  HENT:  Negative for  congestion, dental problem, ear pain and sore throat.   Eyes:  Negative for discharge, redness and visual disturbance.  Respiratory:  Negative for cough, chest tightness, shortness of breath and wheezing.   Cardiovascular:  Negative for chest pain, palpitations and leg swelling.  Gastrointestinal:  Negative for abdominal pain, blood in stool, diarrhea, nausea and vomiting.  Genitourinary:  Negative for difficulty urinating, dysuria, flank pain, frequency, hematuria and urgency.  Musculoskeletal:  Negative for arthralgias, back pain, joint swelling, myalgias and neck stiffness.  Skin:  Negative for pallor and rash.  Neurological:  Negative for dizziness, speech difficulty, weakness and headaches.  Hematological:  Negative for adenopathy. Does not bruise/bleed easily.  Psychiatric/Behavioral:  Negative for confusion and sleep disturbance. The patient is not nervous/anxious.    PE;    06/06/2024    1:40 PM 04/30/2024    3:28 PM 01/07/2024    2:51 PM  Vitals with BMI  Height 5' 9 5' 9   Weight 216 lbs 208 lbs   BMI 31.88 30.7   Systolic 123  145  Diastolic 72  87  Pulse 85     Gen: Alert, well appearing.  Patient is oriented to person, place, time, and situation. AFFECT: pleasant, lucid thought and speech. ENT: Ears: EACs clear, normal epithelium.  TMs with good light reflex and landmarks bilaterally.  Eyes: no injection, icteris, swelling, or exudate.  EOMI, PERRLA. Nose: no drainage or turbinate edema/swelling.  No injection or focal lesion.  Mouth: lips without lesion/swelling.  Oral mucosa pink and moist.  Dentition intact and without obvious caries or gingival swelling.  Oropharynx without  erythema, exudate, or swelling.  Neck: supple/nontender.  No LAD, mass, or TM.  Carotid pulses 2+ bilaterally, without bruits. CV: RRR, no m/r/g.   LUNGS: CTA bilat, nonlabored resps, good aeration in all lung fields. ABD: soft, NT, ND, BS normal.  No hepatospenomegaly or mass.  No bruits. EXT:  no clubbing, cyanosis, or edema.  Musculoskeletal: no joint swelling, erythema, warmth, or tenderness.  ROM of all joints intact. Skin - no sores or suspicious lesions or rashes or color changes  Pertinent labs:  Lab Results  Component Value Date   TSH 1.97 11/16/2023   Lab Results  Component Value Date   WBC 8.9 11/16/2023   HGB 14.1 11/16/2023   HCT 44.1 11/16/2023   MCV 88.2 11/16/2023   PLT 379 11/16/2023   Lab Results  Component Value Date   CREATININE 0.74 11/16/2023   BUN 15 11/16/2023   NA 138 11/16/2023   K 5.4 (H) 11/16/2023   CL 99 11/16/2023   CO2 27 11/16/2023   Lab Results  Component Value Date   ALT 18 11/16/2023   AST 13 11/16/2023   ALKPHOS 77 07/01/2020   BILITOT 0.4 11/16/2023   Lab Results  Component Value Date   CHOL 170 11/16/2023   Lab Results  Component Value Date   HDL 50 11/16/2023   Lab Results  Component Value Date   LDLCALC 100 (H) 11/16/2023   Lab Results  Component Value Date   TRIG 107 11/16/2023   Lab Results  Component Value Date   CHOLHDL 3.4 11/16/2023   Lab Results  Component Value Date   PSA 1.69 05/18/2023   PSA 1.88 04/28/2022   PSA 2.00 04/27/2021   Lab Results  Component Value Date   HGBA1C 6.0 (H) 11/16/2023   ASSESSMENT AND PLAN:   #1 health maintenance exam: Reviewed age and gender appropriate health maintenance issues (prudent diet, regular exercise, health risks of tobacco and excessive alcohol, use of seatbelts, fire alarms in home, use of sunscreen).  Also reviewed age and gender appropriate health screening as well as vaccine recommendations. Vaccines:  Shingrix-> already has prescription at pharmacy.  Influenza->declined.   Labs: cbc, cmet, flp, Hba1c, PSA---> future when fasting. Prostate ca screening: FH prostate cancer->PSA. Colon ca screening: FH colon cancer-->recall 04/2025.  #2 hypertension, well-controlled on losartan  50 mg twice daily. Electrolytes and creatinine ordered--Future.  #3  hypercholesterolemia, doing well on atorvastatin  40 mg a day. Lipid panel and hepatic panel future when fasting.  4.  Prediabetes.  Last hemoglobin A1c About 6 months ago was 6.0%. He has had improved diet since that time. Repeating fasting glucose and hemoglobin A1c--> future when fasting.  An After Visit Summary was printed and given to the patient.  FOLLOW UP:  Return in about 6 months (around 12/05/2024) for routine chronic illness f/u.  Signed:  Gerlene Hockey, MD           06/06/2024     [1]  Allergies Allergen Reactions   Sulfa Antibiotics     Childhood allergy   Sulfasalazine Other (See Comments)    unknown   Lisinopril  Cough   "

## 2024-06-06 ENCOUNTER — Ambulatory Visit: Admitting: Family Medicine

## 2024-06-06 ENCOUNTER — Encounter: Payer: Self-pay | Admitting: Family Medicine

## 2024-06-06 VITALS — BP 123/72 | HR 85 | Temp 98.4°F | Ht 69.0 in | Wt 216.0 lb

## 2024-06-06 DIAGNOSIS — E7800 Pure hypercholesterolemia, unspecified: Secondary | ICD-10-CM | POA: Diagnosis not present

## 2024-06-06 DIAGNOSIS — R7301 Impaired fasting glucose: Secondary | ICD-10-CM | POA: Diagnosis not present

## 2024-06-06 DIAGNOSIS — Z125 Encounter for screening for malignant neoplasm of prostate: Secondary | ICD-10-CM | POA: Diagnosis not present

## 2024-06-06 DIAGNOSIS — I10 Essential (primary) hypertension: Secondary | ICD-10-CM | POA: Diagnosis not present

## 2024-06-06 DIAGNOSIS — Z0000 Encounter for general adult medical examination without abnormal findings: Secondary | ICD-10-CM

## 2024-06-06 MED ORDER — LOSARTAN POTASSIUM 50 MG PO TABS: 50.0000 mg | ORAL_TABLET | Freq: Two times a day (BID) | ORAL | 3 refills | Status: AC

## 2024-06-06 MED ORDER — LOSARTAN POTASSIUM 50 MG PO TABS: 50.0000 mg | ORAL_TABLET | Freq: Two times a day (BID) | ORAL | 3 refills | Status: DC

## 2024-06-06 MED ORDER — ATORVASTATIN CALCIUM 40 MG PO TABS: 40.0000 mg | ORAL_TABLET | Freq: Every day | ORAL | 3 refills | Status: AC

## 2024-06-06 NOTE — Patient Instructions (Addendum)
 Already has prescription   Health Maintenance, Male Adopting a healthy lifestyle and getting preventive care are important in promoting health and wellness. Ask your health care provider about: The right schedule for you to have regular tests and exams. Things you can do on your own to prevent diseases and keep yourself healthy. What should I know about diet, weight, and exercise? Eat a healthy diet  Eat a diet that includes plenty of vegetables, fruits, low-fat dairy products, and lean protein. Do not eat a lot of foods that are high in solid fats, added sugars, or sodium. Maintain a healthy weight Body mass index (BMI) is a measurement that can be used to identify possible weight problems. It estimates body fat based on height and weight. Your health care provider can help determine your BMI and help you achieve or maintain a healthy weight. Get regular exercise Get regular exercise. This is one of the most important things you can do for your health. Most adults should: Exercise for at least 150 minutes each week. The exercise should increase your heart rate and make you sweat (moderate-intensity exercise). Do strengthening exercises at least twice a week. This is in addition to the moderate-intensity exercise. Spend less time sitting. Even light physical activity can be beneficial. Watch cholesterol and blood lipids Have your blood tested for lipids and cholesterol at 73 years of age, then have this test every 5 years. You may need to have your cholesterol levels checked more often if: Your lipid or cholesterol levels are high. You are older than 73 years of age. You are at high risk for heart disease. What should I know about cancer screening? Many types of cancers can be detected early and may often be prevented. Depending on your health history and family history, you may need to have cancer screening at various ages. This may include screening for: Colorectal cancer. Prostate  cancer. Skin cancer. Lung cancer. What should I know about heart disease, diabetes, and high blood pressure? Blood pressure and heart disease High blood pressure causes heart disease and increases the risk of stroke. This is more likely to develop in people who have high blood pressure readings or are overweight. Talk with your health care provider about your target blood pressure readings. Have your blood pressure checked: Every 3-5 years if you are 37-81 years of age. Every year if you are 40 years old or older. If you are between the ages of 61 and 54 and are a current or former smoker, ask your health care provider if you should have a one-time screening for abdominal aortic aneurysm (AAA). Diabetes Have regular diabetes screenings. This checks your fasting blood sugar level. Have the screening done: Once every three years after age 64 if you are at a normal weight and have a low risk for diabetes. More often and at a younger age if you are overweight or have a high risk for diabetes. What should I know about preventing infection? Hepatitis B If you have a higher risk for hepatitis B, you should be screened for this virus. Talk with your health care provider to find out if you are at risk for hepatitis B infection. Hepatitis C Blood testing is recommended for: Everyone born from [birth year] through 1965. Anyone with known risk factors for hepatitis C. Sexually transmitted infections (STIs) You should be screened each year for STIs, including gonorrhea and chlamydia, if: You are sexually active and are younger than 73 years of age. You are older than 73  years of age and your health care provider tells you that you are at risk for this type of infection. Your sexual activity has changed since you were last screened, and you are at increased risk for chlamydia or gonorrhea. Ask your health care provider if you are at risk. Ask your health care provider about whether you are at high risk for HIV.  Your health care provider may recommend a prescription medicine to help prevent HIV infection. If you choose to take medicine to prevent HIV, you should first get tested for HIV. You should then be tested every 3 months for as long as you are taking the medicine. Follow these instructions at home: Alcohol use Do not drink alcohol if your health care provider tells you not to drink. If you drink alcohol: Limit how much you have to 0-2 drinks a day. Know how much alcohol is in your drink. In the [place] one drink equals one 12 oz bottle of beer (355 mL), one 5 oz glass of wine (148 mL), or one 1 oz glass of hard liquor (44 mL). Lifestyle Do not use any products that contain nicotine or tobacco. These products include cigarettes, chewing tobacco, and vaping devices, such as e-cigarettes. If you need help quitting, ask your health care provider. Do not use street drugs. Do not share needles. Ask your health care provider for help if you need support or information about quitting drugs. General instructions Schedule regular health, dental, and eye exams. Stay current with your vaccines. Tell your health care provider if: You often feel depressed. You have ever been abused or do not feel safe at home. Summary Adopting a healthy lifestyle and getting preventive care are important in promoting health and wellness. Follow your health care provider's instructions about healthy diet, exercising, and getting tested or screened for diseases. Follow your health care provider's instructions on monitoring your cholesterol and blood pressure. This information is not intended to replace advice given to you by your health care provider. Make sure you discuss any questions you have with your health care provider. Document Revised: 10/18/2020 Document Reviewed: 10/18/2020 Elsevier Patient Education  2024 [employer].

## 2024-06-10 ENCOUNTER — Other Ambulatory Visit (INDEPENDENT_AMBULATORY_CARE_PROVIDER_SITE_OTHER)

## 2024-06-10 DIAGNOSIS — Z125 Encounter for screening for malignant neoplasm of prostate: Secondary | ICD-10-CM

## 2024-06-10 DIAGNOSIS — I10 Essential (primary) hypertension: Secondary | ICD-10-CM

## 2024-06-10 DIAGNOSIS — E7800 Pure hypercholesterolemia, unspecified: Secondary | ICD-10-CM

## 2024-06-10 DIAGNOSIS — R7301 Impaired fasting glucose: Secondary | ICD-10-CM

## 2024-06-10 LAB — PSA, MEDICARE: PSA: 2.91 ng/mL (ref 0.10–4.00)

## 2024-06-11 LAB — COMPREHENSIVE METABOLIC PANEL WITH GFR
AG Ratio: 1.6 (calc) (ref 1.0–2.5)
ALT: 25 U/L (ref 9–46)
AST: 21 U/L (ref 10–35)
Albumin: 4.1 g/dL (ref 3.6–5.1)
Alkaline phosphatase (APISO): 109 U/L (ref 35–144)
BUN: 13 mg/dL (ref 7–25)
CO2: 28 mmol/L (ref 20–32)
Calcium: 9.4 mg/dL (ref 8.6–10.3)
Chloride: 101 mmol/L (ref 98–110)
Creat: 0.78 mg/dL (ref 0.70–1.28)
Globulin: 2.6 g/dL (ref 1.9–3.7)
Glucose, Bld: 99 mg/dL (ref 65–99)
Potassium: 4.8 mmol/L (ref 3.5–5.3)
Sodium: 139 mmol/L (ref 135–146)
Total Bilirubin: 0.5 mg/dL (ref 0.2–1.2)
Total Protein: 6.7 g/dL (ref 6.1–8.1)
eGFR: 94 mL/min/1.73m2

## 2024-06-11 LAB — CBC WITH DIFFERENTIAL/PLATELET
Absolute Lymphocytes: 1683 {cells}/uL (ref 850–3900)
Absolute Monocytes: 576 {cells}/uL (ref 200–950)
Basophils Absolute: 70 {cells}/uL (ref 0–200)
Basophils Relative: 1.1 %
Eosinophils Absolute: 141 {cells}/uL (ref 15–500)
Eosinophils Relative: 2.2 %
HCT: 45.1 % (ref 39.4–51.1)
Hemoglobin: 14.4 g/dL (ref 13.2–17.1)
MCH: 27.2 pg (ref 27.0–33.0)
MCHC: 31.9 g/dL (ref 31.6–35.4)
MCV: 85.1 fL (ref 81.4–101.7)
MPV: 10.6 fL (ref 7.5–12.5)
Monocytes Relative: 9 %
Neutro Abs: 3930 {cells}/uL (ref 1500–7800)
Neutrophils Relative %: 61.4 %
Platelets: 291 Thousand/uL (ref 140–400)
RBC: 5.3 Million/uL (ref 4.20–5.80)
RDW: 15.4 % — ABNORMAL HIGH (ref 11.0–15.0)
Total Lymphocyte: 26.3 %
WBC: 6.4 Thousand/uL (ref 3.8–10.8)

## 2024-06-11 LAB — LIPID PANEL
Cholesterol: 183 mg/dL
HDL: 49 mg/dL
LDL Cholesterol (Calc): 103 mg/dL — ABNORMAL HIGH
Non-HDL Cholesterol (Calc): 134 mg/dL — ABNORMAL HIGH
Total CHOL/HDL Ratio: 3.7 (calc)
Triglycerides: 184 mg/dL — ABNORMAL HIGH

## 2024-06-11 LAB — HEMOGLOBIN A1C
Hgb A1c MFr Bld: 5.9 % — ABNORMAL HIGH
Mean Plasma Glucose: 123 mg/dL
eAG (mmol/L): 6.8 mmol/L

## 2024-06-13 ENCOUNTER — Encounter: Payer: Self-pay | Admitting: Family Medicine

## 2024-06-13 ENCOUNTER — Ambulatory Visit: Payer: Self-pay | Admitting: Family Medicine

## 2024-12-05 ENCOUNTER — Ambulatory Visit: Admitting: Family Medicine
